# Patient Record
Sex: Female | Born: 1935 | Race: Black or African American | Hispanic: No | Smoking: Never smoker
Health system: Southern US, Community
[De-identification: ages and names within clinical notes are randomized; demographics above are authoritative.]

## PROBLEM LIST (undated history)

## (undated) DIAGNOSIS — R109 Unspecified abdominal pain: Secondary | ICD-10-CM

## (undated) DIAGNOSIS — F0390 Unspecified dementia without behavioral disturbance: Secondary | ICD-10-CM

## (undated) DIAGNOSIS — D126 Benign neoplasm of colon, unspecified: Secondary | ICD-10-CM

## (undated) DIAGNOSIS — E785 Hyperlipidemia, unspecified: Secondary | ICD-10-CM

## (undated) DIAGNOSIS — S065X9A Traumatic subdural hemorrhage with loss of consciousness of unspecified duration, initial encounter: Secondary | ICD-10-CM

## (undated) DIAGNOSIS — K219 Gastro-esophageal reflux disease without esophagitis: Secondary | ICD-10-CM

## (undated) DIAGNOSIS — G8929 Other chronic pain: Secondary | ICD-10-CM

## (undated) DIAGNOSIS — I10 Essential (primary) hypertension: Secondary | ICD-10-CM

## (undated) DIAGNOSIS — K760 Fatty (change of) liver, not elsewhere classified: Secondary | ICD-10-CM

## (undated) DIAGNOSIS — E611 Iron deficiency: Secondary | ICD-10-CM

## (undated) HISTORY — DX: Hyperlipidemia, unspecified: E78.5

## (undated) HISTORY — DX: Fatty (change of) liver, not elsewhere classified: K76.0

## (undated) HISTORY — PX: ANKLE SURGERY: SHX546

## (undated) HISTORY — PX: ABDOMINAL HYSTERECTOMY: SHX81

## (undated) HISTORY — DX: Benign neoplasm of colon, unspecified: D12.6

## (undated) HISTORY — DX: Iron deficiency: E61.1

## (undated) HISTORY — PX: VESICOVAGINAL FISTULA CLOSURE W/ TAH: SUR271

## (undated) HISTORY — DX: Gastro-esophageal reflux disease without esophagitis: K21.9

---

## 2001-03-04 ENCOUNTER — Encounter: Payer: Self-pay | Admitting: Family Medicine

## 2001-03-04 ENCOUNTER — Ambulatory Visit (HOSPITAL_COMMUNITY): Admission: RE | Admit: 2001-03-04 | Discharge: 2001-03-04 | Payer: Self-pay | Admitting: Family Medicine

## 2002-03-06 ENCOUNTER — Encounter: Payer: Self-pay | Admitting: Family Medicine

## 2002-03-06 ENCOUNTER — Ambulatory Visit (HOSPITAL_COMMUNITY): Admission: RE | Admit: 2002-03-06 | Discharge: 2002-03-06 | Payer: Self-pay | Admitting: Family Medicine

## 2003-03-08 ENCOUNTER — Ambulatory Visit (HOSPITAL_COMMUNITY): Admission: RE | Admit: 2003-03-08 | Discharge: 2003-03-08 | Payer: Self-pay | Admitting: Internal Medicine

## 2003-03-08 ENCOUNTER — Encounter: Payer: Self-pay | Admitting: Obstetrics and Gynecology

## 2003-06-07 ENCOUNTER — Ambulatory Visit (HOSPITAL_COMMUNITY): Admission: RE | Admit: 2003-06-07 | Discharge: 2003-06-07 | Payer: Self-pay | Admitting: Family Medicine

## 2003-06-07 ENCOUNTER — Encounter: Payer: Self-pay | Admitting: Family Medicine

## 2004-03-13 ENCOUNTER — Ambulatory Visit (HOSPITAL_COMMUNITY): Admission: RE | Admit: 2004-03-13 | Discharge: 2004-03-13 | Payer: Self-pay | Admitting: Family Medicine

## 2004-04-05 HISTORY — PX: ESOPHAGOGASTRODUODENOSCOPY: SHX1529

## 2004-04-19 ENCOUNTER — Ambulatory Visit (HOSPITAL_COMMUNITY): Admission: RE | Admit: 2004-04-19 | Discharge: 2004-04-19 | Payer: Self-pay | Admitting: Internal Medicine

## 2004-04-28 ENCOUNTER — Ambulatory Visit (HOSPITAL_COMMUNITY): Admission: RE | Admit: 2004-04-28 | Discharge: 2004-04-28 | Payer: Self-pay | Admitting: Internal Medicine

## 2004-06-05 HISTORY — PX: COLONOSCOPY: SHX174

## 2004-06-07 ENCOUNTER — Ambulatory Visit (HOSPITAL_COMMUNITY): Admission: RE | Admit: 2004-06-07 | Discharge: 2004-06-07 | Payer: Self-pay | Admitting: Internal Medicine

## 2004-07-12 ENCOUNTER — Emergency Department (HOSPITAL_COMMUNITY): Admission: EM | Admit: 2004-07-12 | Discharge: 2004-07-12 | Payer: Self-pay | Admitting: Emergency Medicine

## 2005-03-16 ENCOUNTER — Ambulatory Visit (HOSPITAL_COMMUNITY): Admission: RE | Admit: 2005-03-16 | Discharge: 2005-03-16 | Payer: Self-pay | Admitting: Family Medicine

## 2006-03-18 ENCOUNTER — Ambulatory Visit (HOSPITAL_COMMUNITY): Admission: RE | Admit: 2006-03-18 | Discharge: 2006-03-18 | Payer: Self-pay | Admitting: Family Medicine

## 2007-03-21 ENCOUNTER — Ambulatory Visit (HOSPITAL_COMMUNITY): Admission: RE | Admit: 2007-03-21 | Discharge: 2007-03-21 | Payer: Self-pay | Admitting: Family Medicine

## 2007-12-24 ENCOUNTER — Ambulatory Visit (HOSPITAL_COMMUNITY): Admission: RE | Admit: 2007-12-24 | Discharge: 2007-12-24 | Payer: Self-pay | Admitting: Ophthalmology

## 2007-12-29 ENCOUNTER — Emergency Department (HOSPITAL_COMMUNITY): Admission: EM | Admit: 2007-12-29 | Discharge: 2007-12-29 | Payer: Self-pay | Admitting: Emergency Medicine

## 2007-12-31 ENCOUNTER — Emergency Department (HOSPITAL_COMMUNITY): Admission: EM | Admit: 2007-12-31 | Discharge: 2007-12-31 | Payer: Self-pay | Admitting: Emergency Medicine

## 2008-02-10 ENCOUNTER — Emergency Department (HOSPITAL_COMMUNITY): Admission: EM | Admit: 2008-02-10 | Discharge: 2008-02-10 | Payer: Self-pay | Admitting: Emergency Medicine

## 2008-02-12 ENCOUNTER — Emergency Department (HOSPITAL_COMMUNITY): Admission: EM | Admit: 2008-02-12 | Discharge: 2008-02-12 | Payer: Self-pay | Admitting: Emergency Medicine

## 2008-03-01 ENCOUNTER — Encounter (HOSPITAL_COMMUNITY): Admission: RE | Admit: 2008-03-01 | Discharge: 2008-03-31 | Payer: Self-pay | Admitting: Oncology

## 2008-03-01 ENCOUNTER — Ambulatory Visit (HOSPITAL_COMMUNITY): Payer: Self-pay | Admitting: Oncology

## 2008-04-07 ENCOUNTER — Ambulatory Visit (HOSPITAL_COMMUNITY): Admission: RE | Admit: 2008-04-07 | Discharge: 2008-04-07 | Payer: Self-pay | Admitting: Family Medicine

## 2008-05-05 ENCOUNTER — Ambulatory Visit (HOSPITAL_COMMUNITY): Payer: Self-pay | Admitting: Oncology

## 2008-05-05 ENCOUNTER — Encounter (HOSPITAL_COMMUNITY): Admission: RE | Admit: 2008-05-05 | Discharge: 2008-06-04 | Payer: Self-pay | Admitting: Oncology

## 2008-10-05 ENCOUNTER — Encounter (HOSPITAL_COMMUNITY): Admission: RE | Admit: 2008-10-05 | Discharge: 2008-11-03 | Payer: Self-pay | Admitting: Oncology

## 2008-10-05 ENCOUNTER — Ambulatory Visit (HOSPITAL_COMMUNITY): Payer: Self-pay | Admitting: Oncology

## 2009-03-24 ENCOUNTER — Encounter (INDEPENDENT_AMBULATORY_CARE_PROVIDER_SITE_OTHER): Payer: Self-pay | Admitting: *Deleted

## 2009-04-05 ENCOUNTER — Ambulatory Visit (HOSPITAL_COMMUNITY): Admission: RE | Admit: 2009-04-05 | Discharge: 2009-04-05 | Payer: Self-pay | Admitting: Internal Medicine

## 2009-04-11 ENCOUNTER — Ambulatory Visit (HOSPITAL_COMMUNITY): Admission: RE | Admit: 2009-04-11 | Discharge: 2009-04-11 | Payer: Self-pay | Admitting: Family Medicine

## 2009-05-26 DIAGNOSIS — R635 Abnormal weight gain: Secondary | ICD-10-CM

## 2009-05-26 DIAGNOSIS — R1013 Epigastric pain: Secondary | ICD-10-CM

## 2009-05-26 DIAGNOSIS — K219 Gastro-esophageal reflux disease without esophagitis: Secondary | ICD-10-CM | POA: Insufficient documentation

## 2009-05-26 DIAGNOSIS — D126 Benign neoplasm of colon, unspecified: Secondary | ICD-10-CM

## 2009-05-26 DIAGNOSIS — R109 Unspecified abdominal pain: Secondary | ICD-10-CM | POA: Insufficient documentation

## 2009-05-26 DIAGNOSIS — K648 Other hemorrhoids: Secondary | ICD-10-CM | POA: Insufficient documentation

## 2009-06-02 ENCOUNTER — Ambulatory Visit: Payer: Self-pay | Admitting: Internal Medicine

## 2009-06-02 DIAGNOSIS — D509 Iron deficiency anemia, unspecified: Secondary | ICD-10-CM

## 2009-06-02 DIAGNOSIS — Z91011 Allergy to milk products: Secondary | ICD-10-CM

## 2009-06-02 DIAGNOSIS — R198 Other specified symptoms and signs involving the digestive system and abdomen: Secondary | ICD-10-CM

## 2009-06-03 ENCOUNTER — Telehealth (INDEPENDENT_AMBULATORY_CARE_PROVIDER_SITE_OTHER): Payer: Self-pay

## 2009-06-03 ENCOUNTER — Encounter: Payer: Self-pay | Admitting: Internal Medicine

## 2009-06-07 ENCOUNTER — Ambulatory Visit: Payer: Self-pay | Admitting: Internal Medicine

## 2009-06-09 ENCOUNTER — Encounter: Payer: Self-pay | Admitting: Internal Medicine

## 2009-06-10 ENCOUNTER — Encounter: Payer: Self-pay | Admitting: Internal Medicine

## 2009-06-16 ENCOUNTER — Encounter: Payer: Self-pay | Admitting: Internal Medicine

## 2009-06-16 ENCOUNTER — Ambulatory Visit (HOSPITAL_COMMUNITY): Admission: RE | Admit: 2009-06-16 | Discharge: 2009-06-16 | Payer: Self-pay | Admitting: Internal Medicine

## 2009-06-16 ENCOUNTER — Ambulatory Visit: Payer: Self-pay | Admitting: Internal Medicine

## 2009-06-16 HISTORY — PX: COLONOSCOPY: SHX174

## 2009-06-18 ENCOUNTER — Encounter: Payer: Self-pay | Admitting: Internal Medicine

## 2009-07-19 ENCOUNTER — Encounter: Payer: Self-pay | Admitting: Internal Medicine

## 2009-09-27 ENCOUNTER — Encounter: Payer: Self-pay | Admitting: Internal Medicine

## 2009-10-05 HISTORY — PX: COLONOSCOPY: SHX174

## 2009-10-19 ENCOUNTER — Ambulatory Visit (HOSPITAL_COMMUNITY): Admission: RE | Admit: 2009-10-19 | Discharge: 2009-10-19 | Payer: Self-pay | Admitting: Internal Medicine

## 2009-10-19 ENCOUNTER — Ambulatory Visit: Payer: Self-pay | Admitting: Internal Medicine

## 2009-10-27 ENCOUNTER — Telehealth (INDEPENDENT_AMBULATORY_CARE_PROVIDER_SITE_OTHER): Payer: Self-pay

## 2009-11-16 ENCOUNTER — Encounter: Payer: Self-pay | Admitting: Internal Medicine

## 2009-11-17 ENCOUNTER — Telehealth (INDEPENDENT_AMBULATORY_CARE_PROVIDER_SITE_OTHER): Payer: Self-pay

## 2009-12-02 ENCOUNTER — Ambulatory Visit (HOSPITAL_COMMUNITY): Admission: RE | Admit: 2009-12-02 | Discharge: 2009-12-02 | Payer: Self-pay | Admitting: Internal Medicine

## 2009-12-14 ENCOUNTER — Encounter: Payer: Self-pay | Admitting: Internal Medicine

## 2009-12-16 ENCOUNTER — Ambulatory Visit (HOSPITAL_COMMUNITY): Admission: RE | Admit: 2009-12-16 | Discharge: 2009-12-16 | Payer: Self-pay | Admitting: Interventional Radiology

## 2009-12-23 ENCOUNTER — Encounter (INDEPENDENT_AMBULATORY_CARE_PROVIDER_SITE_OTHER): Payer: Self-pay

## 2009-12-28 ENCOUNTER — Encounter: Payer: Self-pay | Admitting: Internal Medicine

## 2010-01-03 HISTORY — PX: COLONOSCOPY: SHX174

## 2010-01-04 ENCOUNTER — Encounter: Payer: Self-pay | Admitting: Internal Medicine

## 2010-01-09 ENCOUNTER — Ambulatory Visit (HOSPITAL_COMMUNITY): Admission: RE | Admit: 2010-01-09 | Discharge: 2010-01-09 | Payer: Self-pay | Admitting: Internal Medicine

## 2010-01-09 ENCOUNTER — Ambulatory Visit: Payer: Self-pay | Admitting: Internal Medicine

## 2010-03-24 ENCOUNTER — Encounter: Payer: Self-pay | Admitting: Internal Medicine

## 2010-04-27 ENCOUNTER — Ambulatory Visit (HOSPITAL_COMMUNITY): Admission: RE | Admit: 2010-04-27 | Discharge: 2010-04-27 | Payer: Self-pay | Admitting: Internal Medicine

## 2010-06-26 ENCOUNTER — Emergency Department (HOSPITAL_COMMUNITY): Admission: EM | Admit: 2010-06-26 | Discharge: 2010-06-26 | Payer: Self-pay | Admitting: Emergency Medicine

## 2010-11-23 ENCOUNTER — Other Ambulatory Visit (HOSPITAL_COMMUNITY): Payer: Self-pay | Admitting: Interventional Radiology

## 2010-11-27 ENCOUNTER — Encounter: Payer: Self-pay | Admitting: Family Medicine

## 2010-12-05 NOTE — Progress Notes (Signed)
----   Converted from flag ---- ---- 11/17/2009 11:22 AM, Jonathon Bellows MD wrote: 6 weeks from now  ---- 11/17/2009 11:19 AM, Cloria Spring LPN wrote: Dr. Jena Gauss, Do you want her repeat TCS  6 weeks from now, or 6 weeks from her procedure on 10/19/2009? ------------------------------

## 2010-12-05 NOTE — Letter (Signed)
Summary: [facility]ICE [facility]  OFFICE [facility]   Imported By: Ave Filter 03/24/2010 10:09:35  _____________________________________________________________________  External Attachment:    Type:   Image     Comment:   External Document

## 2010-12-05 NOTE — Letter (Signed)
Summary: Internal Other triage/instructions  Internal Other triage/instructions   Imported By: Cloria Spring LPN 30/86/5784 69:62:95  _____________________________________________________________________  External Attachment:    Type:   Image     Comment:   External Document

## 2010-12-05 NOTE — Letter (Signed)
Summary: Internal Other Domingo Dimes  Internal Other Domingo Dimes   Imported By: Cloria Spring LPN 09/81/1914 78:29:56  _____________________________________________________________________  External Attachment:    Type:   Image     Comment:   External Document  Appended Document: Internal Other Domingo Dimes triage ok for 3/1  Appended Document: Internal Other Domingo Dimes also, plese hold glucaphage dose night before and morning of procedure  Appended Document: Internal Other /triage Pt is taking iron, she has already taken it today. If she holds now til after procedure on March 1st, will that be sufficient, or will she need to reschedule?  Appended Document: Internal Other /triage Pt was resceduled to 01/09/2010 @ 10:15. Selena Batten is aware.  Appended Document: Internal Other /triage Probably would have been ok; burt reschedule noted

## 2010-12-05 NOTE — Letter (Signed)
Summary: Recall Colonoscopy/Endoscopy, Change to Office Visit  [facility]  [address]   [place], [place] [address]   Phone: [phone]  Fax: [phone]      December 23, 2009   Taylor Andrews [address] [place], [place]  [address] Mar 09, 1934   Dear [name],   According to our records, it is time for you to schedule a Colonoscopy.  Please call our office @ [phone] and ask to speak to the nurse about scheduling this procedure. Please do not neglect your health.   Sincerely,   Cloria Spring LPN  [facility] Ph: [phone]   Fax: [phone]

## 2010-12-05 NOTE — Letter (Signed)
Summary: Patient Notice, Colon Biopsy Results  [facility]  [address]   [place], [place] [address]   Phone: [phone]  Fax: [phone]       November 16, 2009   ARNETRA TERRIS [address] [place], [place]  [address] 31-Dec-1933    Dear [name],  As discussed on the phone with you this evening, I am pleased to inform you that the polyp removed during your recent colonoscopy did not show any evidence of cancer upon pathologic examination. However, because it was rather large and the pathologist said there were some pre-cancerous cells in the polyp, it would be safest to recheck this area of your colon in the near future.  This is best done with another colonoscopy.  If there is any residual polyp in this area that could not be removed at that time, then an operation may be required to remove the involved part of your colon.    You should have a repeat colonoscopy examination  in 6 weeks.  My office staff will schedule this procedure.   Please call us if you are having persistent problems or have questions about your condition that have not been fully answered at this time.  Sincerely,    R. Roetta Sessions MD  [facility] Ph: [phone]    Fax: [phone]   Appended Document: Patient Notice, Colon Biopsy Results Noted in IDX.  Appended Document: Patient Notice, Colon Biopsy Results mailed pt letter

## 2010-12-11 ENCOUNTER — Other Ambulatory Visit (HOSPITAL_COMMUNITY): Payer: Self-pay | Admitting: Interventional Radiology

## 2010-12-11 ENCOUNTER — Ambulatory Visit (HOSPITAL_COMMUNITY)
Admission: RE | Admit: 2010-12-11 | Discharge: 2010-12-11 | Disposition: A | Discharge status: Home / Self Care | Payer: Medicare Other | Source: Ambulatory Visit | Attending: Interventional Radiology | Admitting: Interventional Radiology

## 2010-12-11 ENCOUNTER — Inpatient Hospital Stay (HOSPITAL_COMMUNITY): Admission: RE | Admit: 2010-12-11 | Payer: Self-pay | Source: Ambulatory Visit

## 2010-12-11 DIAGNOSIS — I771 Stricture of artery: Secondary | ICD-10-CM

## 2010-12-11 DIAGNOSIS — G319 Degenerative disease of nervous system, unspecified: Secondary | ICD-10-CM | POA: Insufficient documentation

## 2010-12-11 DIAGNOSIS — R69 Illness, unspecified: Secondary | ICD-10-CM | POA: Insufficient documentation

## 2010-12-11 LAB — CREATININE, SERUM
Creatinine, Ser: 0.81 mg/dL (ref 0.4–1.2)
GFR calc Af Amer: >60 mL/min (ref >60)

## 2010-12-11 MED ORDER — GADOBENATE DIMEGLUMINE 529 MG/ML IV SOLN
15.0000 mL | Freq: Once | INTRAVENOUS | Status: AC
  Administered 2010-12-11: 15 mL via INTRAVENOUS

## 2010-12-11 MED ORDER — GADOBENATE DIMEGLUMINE 529 MG/ML IV SOLN: 15.0000 mL | Freq: Once | INTRAVENOUS | Status: DC

## 2010-12-28 ENCOUNTER — Encounter (INDEPENDENT_AMBULATORY_CARE_PROVIDER_SITE_OTHER): Payer: Self-pay | Admitting: *Deleted

## 2011-01-01 ENCOUNTER — Encounter: Payer: Self-pay | Admitting: Internal Medicine

## 2011-01-02 NOTE — Letter (Signed)
Summary: Recall, Screening Colonoscopy Only  [facility]  [address]   [place], [place] [address]   Phone: [phone]  Fax: [phone]    December 28, 2010  Taylor Andrews [address] [place], [place]  [address] [birth year]   Dear [name],   Our records indicate it is time to schedule your colonoscopy.  Please call our office at [phone] and ask for the nurse.   Thank you, Hendricks Limes, LPN Cloria Spring, LPN  [facility] Ph: [phone]   Fax: [phone]

## 2011-01-11 NOTE — Letter (Signed)
Summary: Internal Other  Internal Other   Imported By: Peggyann Shoals 01/01/2011 12:46:31  _____________________________________________________________________  External Attachment:    Type:   Image     Comment:   External Document

## 2011-01-12 ENCOUNTER — Encounter: Payer: Medicare Other | Admitting: Internal Medicine

## 2011-01-12 ENCOUNTER — Other Ambulatory Visit: Payer: Self-pay | Admitting: Internal Medicine

## 2011-01-12 ENCOUNTER — Ambulatory Visit (HOSPITAL_COMMUNITY)
Admission: RE | Admit: 2011-01-12 | Discharge: 2011-01-12 | Disposition: A | Discharge status: Home / Self Care | Payer: Medicare Other | Source: Ambulatory Visit | Attending: Internal Medicine | Admitting: Internal Medicine

## 2011-01-12 DIAGNOSIS — Z7982 Long term (current) use of aspirin: Secondary | ICD-10-CM | POA: Insufficient documentation

## 2011-01-12 DIAGNOSIS — Z85038 Personal history of other malignant neoplasm of large intestine: Secondary | ICD-10-CM | POA: Insufficient documentation

## 2011-01-12 DIAGNOSIS — E119 Type 2 diabetes mellitus without complications: Secondary | ICD-10-CM | POA: Insufficient documentation

## 2011-01-12 DIAGNOSIS — D126 Benign neoplasm of colon, unspecified: Secondary | ICD-10-CM | POA: Insufficient documentation

## 2011-01-12 DIAGNOSIS — Z860100 Personal history of colon polyps, unspecified: Secondary | ICD-10-CM | POA: Insufficient documentation

## 2011-01-12 DIAGNOSIS — K5730 Diverticulosis of large intestine without perforation or abscess without bleeding: Secondary | ICD-10-CM

## 2011-01-12 DIAGNOSIS — E785 Hyperlipidemia, unspecified: Secondary | ICD-10-CM | POA: Insufficient documentation

## 2011-01-12 DIAGNOSIS — Z79899 Other long term (current) drug therapy: Secondary | ICD-10-CM | POA: Insufficient documentation

## 2011-01-12 DIAGNOSIS — I10 Essential (primary) hypertension: Secondary | ICD-10-CM | POA: Insufficient documentation

## 2011-01-12 DIAGNOSIS — Z09 Encounter for follow-up examination after completed treatment for conditions other than malignant neoplasm: Secondary | ICD-10-CM

## 2011-01-12 HISTORY — PX: COLONOSCOPY: SHX174

## 2011-01-12 LAB — GLUCOSE, CAPILLARY: Glucose-Capillary: 91 mg/dL (ref 70–99)

## 2011-01-15 NOTE — Op Note (Signed)
Taylor Andrews, HUND                  ACCOUNT NO.:  [id number]  MEDICAL RECORD NO.:  [id number]           PATIENT TYPE:  O  LOCATION:  DAYP                          FACILITY:  [facility]  PHYSICIAN:  R. Roetta Sessions, M.D. DATE OF BIRTH:  06/05/1934  DATE OF PROCEDURE:  01/12/2011 DATE OF DISCHARGE:                              OPERATIVE REPORT   PROCEDURE:  Colonoscopy with snare polypectomy.  INDICATIONS FOR PROCEDURE:  The patient is a 77 year old lady with multiple colonic polyps removed in the past couple of years, hepatic flexure, polyp was piecemeal removed.  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  Previously, her last colonoscopy was done about 1 year ago.  There was small minimally remaining polypoid tissue at the hepatic flexure site of previous piecemeal polypectomy.  Pathology revealed a tubular adenoma only without any high-grade dysplasia.  She is clinically done well in the past 1 year.  Colonoscopy is now being done, somewhat early as a surveillance maneuver given relatively high- grade pathology seen recently.  Risks, benefits, limitations, alternatives of this approach have been reviewed, questions answered. Please see the documentation in the medical record.  PROCEDURE NOTE:  O2 saturation, blood pressure, pulse, respirations were monitored throughout the entire procedure.  CONSCIOUS SEDATION:  Versed 4 mg IV, Demerol 75 mg IV in divided doses.  INSTRUMENT:  Pentax video chip system.  FINDINGS:  Digital rectal exam revealed no abnormalities.  Endoscopic findings:  Prep was good.  Colon:  Colonic mucosa was surveyed from the rectosigmoid junction through the left transverse right colon to the appendiceal orifice and ileocecal valve/cecum.  These structures were well seen and photographed for the record.  From this level, the scope was slowly withdrawn.  All previously mentioned mucosal surfaces were again seen.  The patient had scattered left-sided  diverticula.  There is a 5-mm polyp on a stalk at the hepatic flexure on the way in which I cold snared, recovered through the scope.  The site of prior hepatic flexure polypectomy was readily identified, tattooing of the mucosa persisted.  There was a scar present, but there was absolutely no evidence of any residual adenomatous tissue neoplasm.  Please see multiple photos.  Aside from the small polyp taken off the splenic flexure and diverticula, the colonic mucosa appeared otherwise normal. Scope was pulled down into the rectum where thorough examination of rectal mucosa including retroflexed view of the anal verge demonstrated no abnormalities.  The patient tolerated the procedure well.  Cecal withdrawal time 8 minutes.  IMPRESSION: 1. Normal rectum. 2. A 5-mm polyp at the splenic flexure status post cold snare     polypectomy, tattooing, and scarred hepatic flexure, absolutely no     evidence of any residual polyp tissue or neoplasm. 3. Scattered left-sided diverticula.  Remainder of colonic mucosa     appeared normal.  RECOMMENDATIONS: 1. Polyp and diverticulosis literature provided to [name]. 2. Follow up on path. 3. Further recommendations to follow.     Jonathon Bellows, M.D.     RMR/MEDQ  D:  01/12/2011  T:  01/12/2011  Job:  [id number]  cc:   [facility]  Electronically Signed by Lorrin Goodell M.D. on 01/15/2011 10:30:14 AM

## 2011-01-16 ENCOUNTER — Encounter: Payer: Self-pay | Admitting: Internal Medicine

## 2011-01-18 LAB — BASIC METABOLIC PANEL
BUN: 10 mg/dL (ref 6–23)
Calcium: 9.8 mg/dL (ref 8.4–10.5)
GFR calc non Af Amer: >60 mL/min (ref >60)
Potassium: 3.5 mEq/L (ref 3.5–5.1)
Sodium: 138 mEq/L (ref 135–145)

## 2011-01-18 LAB — CBC
HCT: 43.3 % (ref 36.0–46.0)
RDW: 14.1 % (ref 11.5–15.5)
WBC: 6.2 10*3/uL (ref 4.0–10.5)

## 2011-01-18 LAB — DIFFERENTIAL
Basophils Absolute: 0 10*3/uL (ref 0.0–0.1)
Lymphocytes Relative: 34 % (ref 12–46)
Monocytes Relative: 8 % (ref 3–12)
Neutro Abs: 3.5 10*3/uL (ref 1.7–7.7)
Neutrophils Relative %: 56 % (ref 43–77)

## 2011-01-23 NOTE — Letter (Addendum)
Summary: Patient Notice, Colon Biopsy Results  [facility]  [address]   [place], [place] [address]   Phone: [phone]  Fax: [phone]       January 16, 2011   Taylor Andrews [address] [place], [place]  [address] [birth year]    Dear Taylor Andrews,  I am pleased to inform you that the biopsies taken during your recent colonoscopy did not show any evidence of cancer upon pathologic examination.  Additional information/recommendations:  No further action is needed at this time.  Please follow-up with your primary care physician for your other healthcare needs.  You should have a repeat colonoscopy examination  in 5 years.  Please call us if you are having persistent problems or have questions about your condition that have not been fully answered at this time.  Sincerely,    R. Roetta Sessions MD, FACP [facility] Ph: [phone]    Fax: [phone]   Appended Document: Patient Notice, Colon Biopsy Results mailed letter to pt  Appended Document: Patient Notice, Colon Biopsy Results reminder in epic

## 2011-01-25 LAB — GLUCOSE, CAPILLARY
Glucose-Capillary: 110 mg/dL — ABNORMAL HIGH (ref 70–99)
Glucose-Capillary: 88 mg/dL (ref 70–99)

## 2011-01-25 LAB — BASIC METABOLIC PANEL
BUN: 8 mg/dL (ref 6–23)
Chloride: 104 mEq/L (ref 96–112)
GFR calc Af Amer: >60 mL/min (ref >60)
GFR calc non Af Amer: >60 mL/min (ref >60)
Potassium: 3.8 mEq/L (ref 3.5–5.1)
Sodium: 138 mEq/L (ref 135–145)

## 2011-01-25 LAB — CBC
HCT: 42.6 % (ref 36.0–46.0)
Hemoglobin: 13.9 g/dL (ref 12.0–15.0)
MCV: 89.2 fL (ref 78.0–100.0)
RBC: 4.78 MIL/uL (ref 3.87–5.11)
WBC: 7.3 10*3/uL (ref 4.0–10.5)

## 2011-01-25 LAB — APTT: aPTT: 28 seconds (ref 24–37)

## 2011-01-29 ENCOUNTER — Other Ambulatory Visit (HOSPITAL_COMMUNITY): Payer: Self-pay | Admitting: Internal Medicine

## 2011-01-29 DIAGNOSIS — R142 Eructation: Secondary | ICD-10-CM

## 2011-01-30 ENCOUNTER — Encounter (HOSPITAL_COMMUNITY): Payer: Self-pay

## 2011-01-30 ENCOUNTER — Ambulatory Visit (HOSPITAL_COMMUNITY)
Admission: RE | Admit: 2011-01-30 | Discharge: 2011-01-30 | Disposition: A | Discharge status: Home / Self Care | Payer: Medicare Other | Source: Ambulatory Visit | Attending: Internal Medicine | Admitting: Internal Medicine

## 2011-01-30 DIAGNOSIS — R143 Flatulence: Secondary | ICD-10-CM | POA: Insufficient documentation

## 2011-01-30 DIAGNOSIS — K219 Gastro-esophageal reflux disease without esophagitis: Secondary | ICD-10-CM | POA: Insufficient documentation

## 2011-01-30 DIAGNOSIS — R141 Gas pain: Secondary | ICD-10-CM | POA: Insufficient documentation

## 2011-01-30 DIAGNOSIS — R142 Eructation: Secondary | ICD-10-CM | POA: Insufficient documentation

## 2011-01-30 HISTORY — DX: Essential (primary) hypertension: I10

## 2011-02-10 LAB — GLUCOSE, CAPILLARY: Glucose-Capillary: 105 mg/dL — ABNORMAL HIGH (ref 70–99)

## 2011-03-20 NOTE — Op Note (Signed)
NAMEBASILIA, Taylor Andrews                  ACCOUNT NO.:  [id number]   MEDICAL RECORD NO.:  [id number]          PATIENT TYPE:  AMB   LOCATION:  DAY                           FACILITY:  [facility]   PHYSICIAN:  R. Roetta Sessions, M.D. DATE OF BIRTH:  Sep 08, 1934   DATE OF PROCEDURE:  06/16/2009  DATE OF DISCHARGE:                               OPERATIVE REPORT   Colonoscopy with multiple snare polypectomies.   INDICATIONS FOR PROCEDURE:  A 77 year old lady found be Hemoccult  positive recently.  Has also had chronic iron-deficiency notable for  years.  She had a colonoscopy here in 2005,  had a diminutive left colon  polyp removed and internal hemorrhoids.  She has done well clinically  although she had some diarrhea when her iron supplementation was  increased recently.  We saw on the office and obtain a immuno fecal  Hemoccult which was positive.  She comes now for colonoscopy.  This  approach has been discussed with the patient at length.  Risks,  benefits, alternatives and limitations reviewed.  Please see  documentation in the medical record.   PROCEDURE NOTE:  O2 saturation, blood pressure, pulse, respirations were  monitored the entire procedure.   CONSCIOUS SEDATION:  Versed 8 mg IV, Demerol 75 mg IV in divided doses.   INSTRUMENT:  Pentax video chip system.   FINDINGS:  Digital rectal exam revealed no abnormalities.   ENDOSCOPIC FINDINGS:  The prep was good.   COLON:  Colonic mucosa was surveyed from the rectosigmoid junction  through the left, transverse, right colon to the appendiceal orifice,  ileocecal valve and cecum.  These structures were well seen and  photographed for the record.  The patient was noted to have a 8-mm flat  polyp in the area of the proximal transverse colon which was hot snare  removed.  At the hepatic flexure, there was a sprawling, flat,  adenomatous-appearing polyp approximately 3-4 cm in dimensions.  Please  see photos.  This appeared to be an adenomatous  polyp.  Also in the  descending segment, approximately 10 cm back in the ileocecal valve,  there was a 1-cm flat polyp present.  The polyp in the proximal  transverse colon was removed with hot snare cautery.  The polyp in the  ascending segment was removed with hot snare cautery.  The flat polypoid  lesion at the hepatic flexure was lifted off the colonic wall with  normal saline injected submucosally.  This was done nicely and  subsequent hot snare piecemeal polypectomy ensued.  Multiple fragments  were collected for the pathologist.  This lesion was debulked and not  completely removed in this fashion.  There was some oozing in a couple  areas of the polypectomy base which were touched up with the tip of the  snare unit and 7 cc of 1:10,000 epinephrine was injected in the  periphery of the polypectomy crater with good hemostasis noted.  Also, I  elected to use the Endospot and this area was tattooed.  From this level  the scope was slowly and cautiously withdrawn.  All previously  mentioned  mucosal surfaces were again seen.  No other abnormalities were seen.  Scope was pulled down the rectum.  Examination of the rectal mucosa  including retroflex view at the anal verge demonstrated no  abnormalities. It is notable that this procedure took quite a long time  as polyp fragments had to be delivered out via the [place] net.  Collectively cecal withdrawal time 55 minutes.  The patient tolerated  the procedure very well and was reactive in endoscopy.   IMPRESSION:  1. Internal hemorrhoids, otherwise normal rectum.  2. Flat polypoid lesion at the hepatic flexure, debulked with      piecemeal snare polypectomy.  This area was also tattooed.  3. Transverse colon and descending colon  flat adenomatous appearing      polyps resected with hot snare cautery as well.   RECOMMENDATIONS:  1. No aspirin or arthritis medications for the next 2 weeks.  2. Follow-up on path.  3. Assuming the lesion in  the hepatic flexure does not contain frank      carcinoma, plan to bring this lady back in 3 months and reevaluate      the lesion at hepatic flexure and remove whenever residual polyp      tissue may be present.  Further recommendations to follow.      Taylor Andrews, M.D.  Electronically Signed     RMR/MEDQ  D:  06/16/2009  T:  06/16/2009  Job:  [id number]   cc:   Taylor Andrews, M.D.  Fax: [phone]

## 2011-03-23 NOTE — Op Note (Signed)
NAME:  Taylor Andrews, Taylor Andrews                            ACCOUNT NO.:  [id number]   MEDICAL RECORD NO.:  [id number]                   PATIENT TYPE:  AMB   LOCATION:  DAY                                  FACILITY:  [facility]   PHYSICIAN:  R. Roetta Sessions, M.D.              DATE OF BIRTH:  Apr 03, 1934   DATE OF PROCEDURE:  DATE OF DISCHARGE:                                 OPERATIVE REPORT   PROCEDURE:  Diagnostic esophagogastroduodenoscopy.   ENDOSCOPIST:  Gerrit Friends. Rourk, M.D.   INDICATIONS FOR PROCEDURE:  The patient is a 77 year old lady with frequent  symptoms and some intermittent nonspecific abdominal pain.  She has been  taking Voltaren. She has gained almost 50 pounds since 1990.  EGD is now  being done. This approach has been discussed with the patient at length.  The potential risks, benefits, and alternatives have been reviewed;  questions answered.  She is agreeable.  Please see my dictated H&P.   PROCEDURE NOTE:  O2 saturation, blood pressure, pulse and respirations were  monitored throughout the entire procedure.  Conscious sedation: Versed 2 mg  IV, Demerol 50 mg IV in divided doses.   INSTRUMENT:  Olympus video chip system.   FINDINGS:  Examination of the tubular esophagus revealed no mucosal  abnormality.  The EG junction was easily traversed.   STOMACH:  The gastric cavity insufflated well with air.  A thorough  examination of the gastric mucosa including a retroflex view of the proximal  stomach and esophagogastric junction demonstrated no abnormalities aside  from some deformity of the pylorus and a single tiny gastric erosion in the  antrum.  The pylorus was patent and easily traversed.  Examination of the  bulb and second portion revealed no abnormalities.   THERAPEUTIC/DIAGNOSTIC MANEUVERS:  None.   The patient tolerated the procedure well and was reacted in endoscopy.   IMPRESSION:  1. Normal esophagus.  2. A single tiny antral erosion with some deformity of the  pyloric channel     otherwise.  3. Normal gastric mucosa.  4. Normal D1-D2.  5. Some of her symptoms could be fall into the realm of gastroesophageal     reflux disease.  In addition, some of her lower abdominal discomfort is     simply related to bowel function which may bring a component of irritable     bowel syndrome into the picture.   RECOMMENDATIONS:  1. LFTs, CBC and amylase today.  Will go ahead and check a gallbladder     ultrasound as this has not been done previously and will plan to see this     nice lady back in the office in 6-8 weeks.  She has never had a     colonoscopy; and, at some point, later in the year she ought to have at     least a screening colonoscopy.  2. Further recommendations to follow.  ___________________________________________                                            Jonathon Bellows, M.D.   RMR/MEDQ  D:  04/19/2004  T:  04/19/2004  Job:  [id number]   cc:   Kirk Ruths, M.D.  [address]  [place]  [place] [address]  Fax: [phone]   R. Roetta Sessions, M.D.  [address]  [place]  [place] [address]  Fax: [phone]

## 2011-03-23 NOTE — Op Note (Signed)
NAME:  Taylor Andrews, Taylor Andrews                            ACCOUNT NO.:  [id number]   MEDICAL RECORD NO.:  [id number]                   PATIENT TYPE:  AMB   LOCATION:  DAY                                  FAC[facility]:  [facility]   PHYSICIAN:  R. Roetta Sessions, M.D.              DATE OF BIRTH:  Jun 25, 1934   DATE OF PROCEDURE:  06/07/2004  DATE OF DISCHARGE:                                 OPERATIVE REPORT   PROCEDURE:  Colonoscopy with biopsy.   INDICATIONS FOR PROCEDURE:  The patient is a 77 year old lady who comes for  colorectal cancer screening.  She is devoid of any lower GI tract symptoms.  There is no family history of colorectal neoplasia.  She has never had her  colon imaged previously.  Colonoscopy is now being done.  This approach has  been discussed with the patient at length.  The potential risks, benefits,  and alternatives have been reviewed and questions answered.  She is  agreeable.  Please see my handwritten H&P for more information.   PROCEDURE:  O2 saturation, blood pressure, pulses, and respirations were  monitored throughout the entirety of the procedure.  Conscious sedation was  with Versed 3 mg IV, Demerol 75 mg IV in divided doses.  The instrument used  was the Olympus video chip system.   FINDINGS:  Digital rectal examination revealed no abnormalities.   ENDOSCOPIC FINDINGS:  The prep was excellent.   Rectum:  Examination of the rectal mucosa including retroflex view of the  anal verge revealed only internal hemorrhoids.   Colon:  The colonic mucosa was surveyed from the rectosigmoid junction  through the left, transverse, right colon to the area of the appendiceal  orifice, ileocecal valve, and cecum.  These structures were well-seen and  photographed for the record.  From this level, the scope was slowly  withdrawn.  All previously mentioned mucosal surfaces were again seen.  The  colonic mucosa was well-seen.  The only abnormality was a 3-mm polyp at 30  cm which was  cold biopsy/removed.  The patient tolerated the procedure well  and was reactive in endoscopy.   IMPRESSION:  1. Internal hemorrhoids.  Otherwise normal rectum.  2. Diminutive polyp at 30 cm, cold biopsy/removed.  The remainder of the     colonic mucosa appeared normal.   RECOMMENDATIONS:  1. Follow up on pathology.  2. Further recommendations to follow.      ___________________________________________                                            Jonathon Bellows, M.D.   RMR/MEDQ  D:  06/07/2004  T:  06/07/2004  Job:  [id number]   cc:   Kirk Ruths, M.D.  [address]  [place]  [place] [address]  Fax: [phone]

## 2011-03-23 NOTE — Consult Note (Signed)
NAME:  Taylor Andrews, Taylor Andrews                            ACCOUNT NO.:  [id number]   MEDICAL RECORD NO.:  [id number]                  PATIENT TYPE:   LOCATION:                                       FACILITY:  [facility]   PHYSICIAN:  R. Roetta Sessions, M.D.              DATE OF BIRTH:  11/22/1933   DATE OF CONSULTATION:  04/12/2004  DATE OF DISCHARGE:                                   CONSULTATION   GASTROENTEROLOGY CONSULTATION:   PRIMARY CARE PHYSICIAN:  Kirk Ruths, M.D.   REASON FOR CONSULTATION:  Epigastric pain, refractory reflux symptoms.   HISTORY OF PRESENT ILLNESS:  Taylor Andrews is a 77 year old obese African-  American female kindly sent over courtesy of Dr. Yetta Numbers to further  evaluate 18-month history of epigastric pain which sometimes bothers her in  the evening and in the early morning hours, not affected by eating,  sometimes lessened with having a bowel movement but sometimes exacerbated  after having a bowel movement, has not had any melena or rectal bleeding,  she does have indigestion all the time.  It is notable she was started on  Voltaren 6 months ago for arthritic complaints, also started on Protonix 40  mg orally daily 6 months ago which she says initially helped her reflux  symptoms but it is now like she is not taking the medication at all.  No  odynophagia, no dysphagia.  She tells me she has gained 47 pounds gradually  since 1990 when she relocated to this area from [place].  She describes  having an upper GI series some time several months ago without any  significant findings.  I do not have a copy of that report for review.  She  has also been given some Levsin sublingual for the above-mentioned symptoms  which made her dizzy but has not really helped in respects to her  gallbladder in situ.   PAST MEDICAL HISTORY:  1. Hypertension.  2. Type 2 diabetes mellitus.  3. Osteoarthritis.   PAST SURGICAL HISTORY:  1. Hysterectomy.  2. Left ankle  pins.   She has never had a colonoscopy.   CURRENT MEDICATIONS:  1. Norvasc 10 mg daily.  2. Glucophage 500 mg b.i.d.  3. Hydrochlorothiazide 25 mg half tablet daily.  4. Protonix 40 mg daily.  5. Voltaren 75 mg b.i.d.  6. Levsin sublingual 0.125 mg p.r.n.  7. Aspirin 81 mg daily.  8. Vitamin C 500 mg daily.  9. Cod liver oil daily.  10.      Glucosamine 500 mg daily.  11.      Multivitamin daily.  12.      Calcium 600 daily.   ALLERGIES:  No known drug allergies (does have some GI upset with milk, ice  cream, bananas).   FAMILY HISTORY:  Mother died of old age at age 83.  Father had a heart  murmur  and died at age 79.  No history of chronic GI or liver disease  otherwise.   SOCIAL HISTORY:  Patient is single, has three grown sons, she is retired.  No tobacco, no alcohol.   REVIEW OF SYSTEMS:  As in history of present illness no chest pain, dyspnea  on exertion, weight gain as eluted to above, no fever or chills.   PHYSICAL EXAMINATION:  GENERAL:  Pleasant 77 year old obese female resting  comfortably.  VITAL SIGNS:  Weight 187.25, height 4 feet 9-1/2 inches, temperature 97.4,  BP 130/80, pulse 70.  SKIN:  Warm and dry.  HEENT:  No scleral icterus.  Oral cavity no lesions.  JVD is not prominent.  CHEST:  Lungs are clear to auscultation.  HEART:  Regular rate and rhythm, without murmur, gallop, or rub.  BREAST:  Exam is deferred.  ABDOMEN:  Obese, positive bowel sounds, soft, nontender, without appreciable  mass or organomegaly.  EXTREMITIES:  No edema.   ADMITTING IMPRESSION:  Taylor Andrews is a 77 year old lady with a 9-month  history of somewhat vague epigastric pain lasting no more than several  minutes at a time, sometimes worsened and sometimes ameliorated with having  a bowel movement, she describes reflux symptoms that are now refractory to  proton pump inhibitor therapy.   It is interesting to note this lady's symptoms started when she began  Voltaren some  6 months ago.  I am certainly concerned about the possibility  of peptic ulcer disease and unbridled gastroesophageal reflux disease in the  setting of significant obesity and steady weight gain.  At this point  gallbladder disease and an element of inflammatory bowel syndrome is not  excluded.  She has never had a colonoscopy.   RECOMMENDATIONS:  We will proceed with an EGD in the very near future to  further evaluate her symptoms.  This approach has been discussed with Ms.  Taylor Andrews at length.  Potential risks, benefits, and alternatives have been  reviewed.  I will make further recommendations in regards to her symptoms  after endoscopic evaluation has been  completed.  As a separated issue I have recommended that Taylor Andrews should  come back later in the year for screening colonoscopy.   I would like to thank Dr. Yetta Numbers for allowing me to see this nice lady  today.      ___________________________________________                                            Jonathon Bellows, M.D.   RMR/MEDQ  D:  04/12/2004  T:  04/12/2004  Job:  [id number]   cc:   Kirk Ruths, M.D.  [address]  [place]  [place] [address]  Fax: [phone]

## 2011-03-28 ENCOUNTER — Other Ambulatory Visit (HOSPITAL_COMMUNITY): Payer: Self-pay | Admitting: Internal Medicine

## 2011-03-28 DIAGNOSIS — Z139 Encounter for screening, unspecified: Secondary | ICD-10-CM

## 2011-04-30 ENCOUNTER — Ambulatory Visit (HOSPITAL_COMMUNITY): Admission: RE | Admit: 2011-04-30 | Payer: Medicare Other | Source: Ambulatory Visit

## 2011-06-01 ENCOUNTER — Ambulatory Visit (HOSPITAL_COMMUNITY)
Admission: RE | Admit: 2011-06-01 | Discharge: 2011-06-01 | Disposition: A | Discharge status: Home / Self Care | Payer: Medicare Other | Source: Ambulatory Visit | Attending: Internal Medicine | Admitting: Internal Medicine

## 2011-06-01 DIAGNOSIS — Z1231 Encounter for screening mammogram for malignant neoplasm of breast: Secondary | ICD-10-CM | POA: Insufficient documentation

## 2011-06-01 DIAGNOSIS — Z139 Encounter for screening, unspecified: Secondary | ICD-10-CM

## 2011-07-27 LAB — DIFFERENTIAL
Basophils Absolute: 0
Basophils Relative: 0
Eosinophils Absolute: 0.1
Lymphocytes Relative: 6 — ABNORMAL LOW
Lymphs Abs: 0.9
Lymphs Abs: 1.1
Monocytes Absolute: 0.8
Monocytes Relative: 6
Neutro Abs: 12 — ABNORMAL HIGH
Neutro Abs: 13.8 — ABNORMAL HIGH

## 2011-07-27 LAB — CBC
Hemoglobin: 11.5 — ABNORMAL LOW
Hemoglobin: 12.9
MCHC: 33.1
MCV: 84.2
Platelets: 377
RBC: 4.13
RDW: 14.3
WBC: 15.5 — ABNORMAL HIGH

## 2011-07-27 LAB — BASIC METABOLIC PANEL
BUN: 20
Calcium: 9.2
GFR calc non Af Amer: 56 — ABNORMAL LOW
Glucose, Bld: 177 — ABNORMAL HIGH
Sodium: 133 — ABNORMAL LOW

## 2011-07-27 LAB — SEDIMENTATION RATE: Sed Rate: 33 — ABNORMAL HIGH

## 2011-07-31 LAB — IRON AND TIBC
Saturation Ratios: 5 — ABNORMAL LOW
UIBC: 275

## 2011-07-31 LAB — DIFFERENTIAL
Basophils Absolute: 0
Basophils Relative: 0
Eosinophils Absolute: 0
Eosinophils Relative: 0
Eosinophils Relative: 1
Lymphocytes Relative: 7 — ABNORMAL LOW
Lymphocytes Relative: 33
Monocytes Absolute: 0.5
Monocytes Absolute: 0.7
Monocytes Relative: 7
Neutro Abs: 6

## 2011-07-31 LAB — BASIC METABOLIC PANEL
BUN: 8
CO2: 28
GFR calc non Af Amer: >60
Glucose, Bld: 142 — ABNORMAL HIGH
Potassium: 3 — ABNORMAL LOW

## 2011-07-31 LAB — CBC
HCT: 35.2 — ABNORMAL LOW
HCT: 37.9
MCHC: 33.6
Platelets: 434 — ABNORMAL HIGH
Platelets: 556 — ABNORMAL HIGH
RDW: 17.5 — ABNORMAL HIGH
RDW: 16.7 — ABNORMAL HIGH
WBC: 10.2

## 2011-07-31 LAB — URINALYSIS, ROUTINE W REFLEX MICROSCOPIC
Bilirubin Urine: NEGATIVE
Glucose, UA: NEGATIVE
Hgb urine dipstick: NEGATIVE
Specific Gravity, Urine: 1.015
pH: 6

## 2011-07-31 LAB — CK TOTAL AND CKMB (NOT AT ARMC): Relative Index: INVALID

## 2011-07-31 LAB — COMPREHENSIVE METABOLIC PANEL
AST: 18
Albumin: 3.7
Alkaline Phosphatase: 76
BUN: 5 — ABNORMAL LOW
Chloride: 99
Creatinine, Ser: 0.61
GFR calc Af Amer: >60
Potassium: 3.2 — ABNORMAL LOW
Total Bilirubin: 0.2 — ABNORMAL LOW
Total Protein: 7

## 2011-07-31 LAB — URINE MICROSCOPIC-ADD ON

## 2011-07-31 LAB — TROPONIN I: Troponin I: 0.03

## 2011-08-01 LAB — POTASSIUM: Potassium: 3.7

## 2011-08-02 LAB — CBC
Platelets: 342
RBC: 4.88
WBC: 7.3

## 2011-08-02 LAB — DIFFERENTIAL
Lymphocytes Relative: 38
Lymphs Abs: 2.8
Monocytes Relative: 7
Neutro Abs: 3.8
Neutrophils Relative %: 52

## 2011-08-02 LAB — POTASSIUM: Potassium: 3.6

## 2011-08-08 ENCOUNTER — Emergency Department (HOSPITAL_COMMUNITY): Payer: Medicare Other

## 2011-08-08 ENCOUNTER — Encounter (HOSPITAL_COMMUNITY): Payer: Self-pay | Admitting: Emergency Medicine

## 2011-08-08 ENCOUNTER — Emergency Department (HOSPITAL_COMMUNITY)
Admission: EM | Admit: 2011-08-08 | Discharge: 2011-08-08 | Disposition: A | Discharge status: Home / Self Care | Payer: Medicare Other | Attending: Emergency Medicine | Admitting: Emergency Medicine

## 2011-08-08 DIAGNOSIS — W07XXXA Fall from chair, initial encounter: Secondary | ICD-10-CM | POA: Insufficient documentation

## 2011-08-08 DIAGNOSIS — S0990XA Unspecified injury of head, initial encounter: Secondary | ICD-10-CM | POA: Insufficient documentation

## 2011-08-08 DIAGNOSIS — W268XXA Contact with other sharp object(s), not elsewhere classified, initial encounter: Secondary | ICD-10-CM | POA: Insufficient documentation

## 2011-08-08 DIAGNOSIS — T148XXA Other injury of unspecified body region, initial encounter: Secondary | ICD-10-CM | POA: Insufficient documentation

## 2011-08-08 DIAGNOSIS — W01119A Fall on same level from slipping, tripping and stumbling with subsequent striking against unspecified sharp object, initial encounter: Secondary | ICD-10-CM | POA: Insufficient documentation

## 2011-08-08 DIAGNOSIS — Z79899 Other long term (current) drug therapy: Secondary | ICD-10-CM | POA: Insufficient documentation

## 2011-08-08 DIAGNOSIS — E119 Type 2 diabetes mellitus without complications: Secondary | ICD-10-CM | POA: Insufficient documentation

## 2011-08-08 DIAGNOSIS — I10 Essential (primary) hypertension: Secondary | ICD-10-CM | POA: Insufficient documentation

## 2011-08-08 NOTE — ED Provider Notes (Addendum)
History     CSN: [id number] Arrival date & time: 08/08/2011 10:14 AM  Chief Complaint  Patient presents with  . Fall  . Head Injury    (Consider location/radiation/quality/duration/timing/severity/associated sxs/prior treatment) Patient is a 77 y.o. female presenting with fall and head injury. The history is provided by the patient.  Fall The accident occurred less than 1 hour ago. The fall occurred from a stool (she was standing on a chair in her home trying to hang a picture,  when she fell off,  and hit her forehead against her vcr.). She fell from a height of 1 to 2 ft. She landed on a hard floor. The point of impact was the head. The pain is present in the head. The pain is at a severity of 4/10. The pain is moderate. She was ambulatory at the scene. There was no entrapment after the fall. There was no drug use involved in the accident. Pertinent negatives include no visual change, no fever, no numbness, no abdominal pain, no nausea, no vomiting, no headaches, no loss of consciousness and no tingling. Exacerbated by: Forehead contusion pain is worsened with palpation. She has tried nothing for the symptoms.  Head Injury  Pertinent negatives include no numbness, no vomiting and no weakness.    Past Medical History  Diagnosis Date  . Diabetes mellitus   . Hypertension   . Hyperlipidemia   . Fatty liver     on [place] normal LFT'S  . Iron deficiency     Chronic current parameter normal; 4/09 iron was 12 sat 5%  . GERD (gastroesophageal reflux disease)     Past Surgical History  Procedure Date  . Colonoscopy 8/05    internal hemorrhoids,polyp bengin  . Esophagogastroduodenoscopy 6/05    tiny antral erosin with some deformity of pyloric channel  . Vesicovaginal fistula closure w/ tah   . Ankle surgery     Left ;pins    History reviewed. No pertinent family history.  History  Substance Use Topics  . Smoking status: Not on file  . Smokeless tobacco: Not on file  . Alcohol  Use: No    OB History    Grav Para Term Preterm Abortions TAB SAB Ect Mult Living                  Review of Systems  Constitutional: Negative for fever.  HENT: Negative for hearing loss, congestion, sore throat, rhinorrhea, neck pain and neck stiffness.   Eyes: Negative.   Respiratory: Negative for chest tightness and shortness of breath.   Cardiovascular: Negative for chest pain.  Gastrointestinal: Negative for nausea, vomiting and abdominal pain.  Genitourinary: Negative.   Musculoskeletal: Negative for back pain, joint swelling and arthralgias.  Skin: Negative.  Negative for rash and wound.  Neurological: Negative for dizziness, tingling, loss of consciousness, weakness, light-headedness, numbness and headaches.  Hematological: Negative.   Psychiatric/Behavioral: Negative.     Allergies  Iohexol and Milk-related compounds  Home Medications   Current Outpatient Rx  Name Route Sig Dispense Refill  . AMLODIPINE BESYLATE 10 MG PO TABS Oral Take 10 mg by mouth daily.      . ASPIRIN 81 MG PO TABS Oral Take 81 mg by mouth daily.      . ASPIRIN EC 81 MG PO TBEC Oral Take 81 mg by mouth daily.      Marland Kitchen CALCIUM CARBONATE 600 MG PO TABS Oral Take 600 mg by mouth 2 (two) times daily with a meal.      .  COD LIVER OIL PO Oral Take 1 capsule by mouth daily.     Marland Kitchen FERROUS GLUCONATE 325 MG PO TABS Oral Take 325 mg by mouth daily with breakfast.     . OMEGA-3 FATTY ACIDS 1000 MG PO CAPS Oral Take 1 g by mouth daily.     Marland Kitchen HYDROCHLOROTHIAZIDE 25 MG PO TABS Oral Take 12.5 mg by mouth daily.     Marland Kitchen METFORMIN HCL 500 MG PO TABS Oral Take 500 mg by mouth 2 (two) times daily with a meal.      . MULTIVITAMINS PO CAPS Oral Take 1 capsule by mouth daily.      . NEBIVOLOL HCL 2.5 MG PO TABS Oral Take 2.5 mg by mouth daily.      Marland Kitchen OMEPRAZOLE 20 MG PO CPDR Oral Take 20 mg by mouth daily.      Marland Kitchen POTASSIUM CHLORIDE CRYS CR 20 MEQ PO TBCR Oral Take 20 mEq by mouth daily.     Marland Kitchen VITAMIN C 500 MG PO TABS  Oral Take 500 mg by mouth daily.      Marland Kitchen SIMVASTATIN 40 MG PO TABS Oral Take 40 mg by mouth at bedtime.        BP 135/49  Pulse 69  Temp(Src) 97.9 F (36.6 C) (Oral)  Resp 18  Ht 4' 9.5" (1.461 m)  Wt 150 lb (68.04 kg)  BMI 31.90 kg/m2  SpO2 98%  Physical Exam  Nursing note and vitals reviewed. Constitutional: She is oriented to person, place, and time. She appears well-developed and well-nourished.       Uncomfortable appearing  HENT:  Head: Normocephalic.  Mouth/Throat: Oropharynx is clear and moist.       Hematoma right forehead.    Eyes: EOM are normal. Pupils are equal, round, and reactive to light.  Neck: Normal range of motion. Neck supple.  Cardiovascular: Normal rate and normal heart sounds.   Pulmonary/Chest: Effort normal.  Abdominal: Soft. There is no tenderness.  Musculoskeletal: Normal range of motion. She exhibits no edema and no tenderness.       No spine tenderness.   Lymphadenopathy:    She has no cervical adenopathy.  Neurological: She is alert and oriented to person, place, and time. She has normal strength. She displays no tremor (Equal grip strength). No cranial nerve deficit or sensory deficit. She displays a negative Romberg sign. Coordination and gait normal. GCS eye subscore is 4. GCS verbal subscore is 5. GCS motor subscore is 6.  Skin: Skin is warm and dry. No rash noted.  Psychiatric: She has a normal mood and affect. Her speech is normal and behavior is normal. Thought content normal. Cognition and memory are normal.    ED Course  Procedures (including critical care time)  Labs Reviewed - No data to display Ct Head Wo Contrast  08/08/2011  *RADIOLOGY REPORT*  Clinical Data:  Fall with trauma to the right side of the head. Head and neck injury.  Previous vascular stent.  CT HEAD WITHOUT CONTRAST CT CERVICAL SPINE WITHOUT CONTRAST  Technique:  Multidetector CT imaging of the head and cervical spine was performed following the standard protocol  without intravenous contrast.  Multiplanar CT image reconstructions of the cervical spine were also generated.  Comparison:  MRI 12/11/2010.  CT 06/26/2010.  CT HEAD  Findings: The brain shows mild generalized atrophy.  There is no evidence of old or acute infarction, mass lesion, hemorrhage, hydrocephalus or extra-axial collection.  No skull fracture.  No fluid in  the sinuses, middle ears or mastoids. There is right frontal scalp swelling.  I cannot identify an intracranial vascular stent.  IMPRESSION: Normal head CT for age except for right frontal scalp swelling.  No acute or traumatic finding otherwise.  CT CERVICAL SPINE  Findings: Alignment of the spine is normal.  There is no evidence of fracture.  There is chronic degenerative spondylosis throughout the cervical region with disc space narrowing and osteophyte formation.  There is facet degeneration on the left at C4-5.  IMPRESSION: No acute or traumatic finding.  Ordinary degenerative cervical spondylosis.  Left-sided facet degeneration at C4-5.  Original Report Authenticated By: Thomasenia Sales, M.D.   Ct Cervical Spine Wo Contrast  08/08/2011  *RADIOLOGY REPORT*  Clinical Data:  Fall with trauma to the right side of the head. Head and neck injury.  Previous vascular stent.  CT HEAD WITHOUT CONTRAST CT CERVICAL SPINE WITHOUT CONTRAST  Technique:  Multidetector CT imaging of the head and cervical spine was performed following the standard protocol without intravenous contrast.  Multiplanar CT image reconstructions of the cervical spine were also generated.  Comparison:  MRI 12/11/2010.  CT 06/26/2010.  CT HEAD  Findings: The brain shows mild generalized atrophy.  There is no evidence of old or acute infarction, mass lesion, hemorrhage, hydrocephalus or extra-axial collection.  No skull fracture.  No fluid in the sinuses, middle ears or mastoids. There is right frontal scalp swelling.  I cannot identify an intracranial vascular stent.  IMPRESSION: Normal  head CT for age except for right frontal scalp swelling.  No acute or traumatic finding otherwise.  CT CERVICAL SPINE  Findings: Alignment of the spine is normal.  There is no evidence of fracture.  There is chronic degenerative spondylosis throughout the cervical region with disc space narrowing and osteophyte formation.  There is facet degeneration on the left at C4-5.  IMPRESSION: No acute or traumatic finding.  Ordinary degenerative cervical spondylosis.  Left-sided facet degeneration at C4-5.  Original Report Authenticated By: Thomasenia Sales, M.D.     1. Minor head injury   2. Hematoma       MDM  NO acute findings on ct scans and normal neuro exam displayed today.          Candis Musa, PA 08/08/11 1210  Candis Musa, PA 08/11/11 1700

## 2011-08-08 NOTE — ED Provider Notes (Signed)
Medical screening examination/treatment/procedure(s) were conducted as a shared visit with non-physician practitioner(s) and myself.  I personally evaluated the patient during the encounter  Medical screening examination/treatment/procedure(s) were conducted as a shared visit with non-physician practitioner(s) and myself.  I personally evaluated the patient during the encounter He fell off a chair today.   No loc. No n/v/vision changes. No neck pain. No other injuries.  Mild ha. Slight hematoma.  Head ct neg.  Will release. No tx indicated.  Nicholes Stairs, MD 08/08/11 1222   Nicholes Stairs, MD 08/08/11 1225

## 2011-08-08 NOTE — ED Notes (Signed)
Pt states she fell and striking her head on the vcr. Pt denies any loc. Pt states she tripped over her feet.

## 2011-08-08 NOTE — ED Provider Notes (Deleted)
Medical screening examination/treatment/procedure(s) were conducted as a shared visit with non-physician practitioner(s) and myself.  I personally evaluated the patient during the encounter He fell off a chair today.   No loc. No n/v/vision changes. No neck pain. No other injuries.  Mild ha. Slight hematoma.  Head ct neg.  Will release. No tx indicated.  Nicholes Stairs, MD 08/08/11 1222  Nicholes Stairs, MD 08/08/11 1223  Nicholes Stairs, MD 08/08/11 1223

## 2011-08-10 LAB — DIFFERENTIAL
Eosinophils Relative: 2 % (ref 0–5)
Lymphocytes Relative: 35 % (ref 12–46)
Lymphs Abs: 2.4 10*3/uL (ref 0.7–4.0)
Monocytes Relative: 8 % (ref 3–12)

## 2011-08-10 LAB — CBC
HCT: 41 % (ref 36.0–46.0)
Hemoglobin: 13.4 g/dL (ref 12.0–15.0)
Platelets: 398 10*3/uL (ref 150–400)
RBC: 4.72 MIL/uL (ref 3.87–5.11)
WBC: 7 10*3/uL (ref 4.0–10.5)

## 2011-08-16 NOTE — ED Provider Notes (Signed)
Medical screening examination/treatment/procedure(s) were performed by non-physician practitioner and as supervising physician I was immediately available for consultation/collaboration.  Nicholes Stairs, MD 08/16/11 1558

## 2012-01-29 ENCOUNTER — Other Ambulatory Visit (HOSPITAL_COMMUNITY): Payer: Self-pay | Admitting: Internal Medicine

## 2012-01-29 DIAGNOSIS — R51 Headache: Secondary | ICD-10-CM

## 2012-01-31 ENCOUNTER — Ambulatory Visit (HOSPITAL_COMMUNITY)
Admission: RE | Admit: 2012-01-31 | Discharge: 2012-01-31 | Disposition: A | Discharge status: Home / Self Care | Payer: Medicare Other | Source: Ambulatory Visit | Attending: Internal Medicine | Admitting: Internal Medicine

## 2012-01-31 DIAGNOSIS — R51 Headache: Secondary | ICD-10-CM | POA: Insufficient documentation

## 2012-02-08 ENCOUNTER — Other Ambulatory Visit (HOSPITAL_COMMUNITY): Payer: Self-pay | Admitting: Internal Medicine

## 2012-02-08 DIAGNOSIS — Z139 Encounter for screening, unspecified: Secondary | ICD-10-CM

## 2012-04-14 ENCOUNTER — Other Ambulatory Visit (HOSPITAL_COMMUNITY): Payer: Self-pay | Admitting: Internal Medicine

## 2012-04-14 DIAGNOSIS — Z139 Encounter for screening, unspecified: Secondary | ICD-10-CM

## 2012-04-17 ENCOUNTER — Other Ambulatory Visit (HOSPITAL_COMMUNITY): Payer: Medicare Other

## 2012-04-28 ENCOUNTER — Other Ambulatory Visit (HOSPITAL_COMMUNITY): Payer: Medicare Other

## 2012-05-19 ENCOUNTER — Other Ambulatory Visit (HOSPITAL_COMMUNITY): Payer: Self-pay | Admitting: Internal Medicine

## 2012-05-19 DIAGNOSIS — K219 Gastro-esophageal reflux disease without esophagitis: Secondary | ICD-10-CM

## 2012-05-19 DIAGNOSIS — R634 Abnormal weight loss: Secondary | ICD-10-CM

## 2012-05-23 ENCOUNTER — Other Ambulatory Visit (HOSPITAL_COMMUNITY): Payer: Medicare Other

## 2012-05-26 ENCOUNTER — Other Ambulatory Visit (HOSPITAL_COMMUNITY): Payer: Self-pay | Admitting: Internal Medicine

## 2012-05-26 DIAGNOSIS — K219 Gastro-esophageal reflux disease without esophagitis: Secondary | ICD-10-CM

## 2012-05-26 DIAGNOSIS — R634 Abnormal weight loss: Secondary | ICD-10-CM

## 2012-05-27 ENCOUNTER — Ambulatory Visit (HOSPITAL_COMMUNITY)
Admission: RE | Admit: 2012-05-27 | Discharge: 2012-05-27 | Disposition: A | Discharge status: Home / Self Care | Payer: Medicare Other | Source: Ambulatory Visit | Attending: Internal Medicine | Admitting: Internal Medicine

## 2012-05-27 ENCOUNTER — Other Ambulatory Visit (HOSPITAL_COMMUNITY): Payer: Self-pay | Admitting: Internal Medicine

## 2012-05-27 DIAGNOSIS — K219 Gastro-esophageal reflux disease without esophagitis: Secondary | ICD-10-CM

## 2012-05-27 DIAGNOSIS — R634 Abnormal weight loss: Secondary | ICD-10-CM | POA: Insufficient documentation

## 2012-05-27 DIAGNOSIS — R911 Solitary pulmonary nodule: Secondary | ICD-10-CM | POA: Insufficient documentation

## 2012-06-02 ENCOUNTER — Ambulatory Visit (HOSPITAL_COMMUNITY): Payer: Medicare Other

## 2012-06-02 ENCOUNTER — Ambulatory Visit (HOSPITAL_COMMUNITY)
Admission: RE | Admit: 2012-06-02 | Discharge: 2012-06-02 | Disposition: A | Discharge status: Home / Self Care | Payer: Medicare Other | Source: Ambulatory Visit | Attending: Internal Medicine | Admitting: Internal Medicine

## 2012-06-02 DIAGNOSIS — Z139 Encounter for screening, unspecified: Secondary | ICD-10-CM

## 2012-06-02 DIAGNOSIS — Z1231 Encounter for screening mammogram for malignant neoplasm of breast: Secondary | ICD-10-CM | POA: Insufficient documentation

## 2012-06-19 ENCOUNTER — Other Ambulatory Visit (HOSPITAL_COMMUNITY): Payer: Medicare Other

## 2012-06-25 ENCOUNTER — Ambulatory Visit (HOSPITAL_COMMUNITY)
Admission: RE | Admit: 2012-06-25 | Discharge: 2012-06-25 | Disposition: A | Discharge status: Home / Self Care | Payer: Medicare Other | Source: Ambulatory Visit | Attending: Internal Medicine | Admitting: Internal Medicine

## 2012-06-25 DIAGNOSIS — Z139 Encounter for screening, unspecified: Secondary | ICD-10-CM

## 2012-06-25 DIAGNOSIS — Z13820 Encounter for screening for osteoporosis: Secondary | ICD-10-CM | POA: Insufficient documentation

## 2012-06-25 DIAGNOSIS — Z780 Asymptomatic menopausal state: Secondary | ICD-10-CM | POA: Insufficient documentation

## 2012-10-17 ENCOUNTER — Other Ambulatory Visit (HOSPITAL_COMMUNITY): Payer: Self-pay | Admitting: Internal Medicine

## 2012-10-17 DIAGNOSIS — K56699 Other intestinal obstruction unspecified as to partial versus complete obstruction: Secondary | ICD-10-CM

## 2012-10-17 DIAGNOSIS — R109 Unspecified abdominal pain: Secondary | ICD-10-CM

## 2012-10-22 ENCOUNTER — Ambulatory Visit (HOSPITAL_COMMUNITY): Payer: Medicare Other

## 2012-10-27 ENCOUNTER — Ambulatory Visit (HOSPITAL_COMMUNITY)
Admission: RE | Admit: 2012-10-27 | Discharge: 2012-10-27 | Disposition: A | Discharge status: Home / Self Care | Payer: Medicare Other | Source: Ambulatory Visit | Attending: Internal Medicine | Admitting: Internal Medicine

## 2012-10-27 ENCOUNTER — Other Ambulatory Visit (HOSPITAL_COMMUNITY): Payer: Self-pay | Admitting: Internal Medicine

## 2012-10-27 DIAGNOSIS — K219 Gastro-esophageal reflux disease without esophagitis: Secondary | ICD-10-CM | POA: Insufficient documentation

## 2012-10-27 DIAGNOSIS — R109 Unspecified abdominal pain: Secondary | ICD-10-CM | POA: Insufficient documentation

## 2012-10-27 DIAGNOSIS — E119 Type 2 diabetes mellitus without complications: Secondary | ICD-10-CM | POA: Insufficient documentation

## 2012-10-27 DIAGNOSIS — I10 Essential (primary) hypertension: Secondary | ICD-10-CM | POA: Insufficient documentation

## 2012-10-27 DIAGNOSIS — K56699 Other intestinal obstruction unspecified as to partial versus complete obstruction: Secondary | ICD-10-CM

## 2012-12-30 ENCOUNTER — Encounter (HOSPITAL_COMMUNITY): Payer: Self-pay

## 2012-12-30 ENCOUNTER — Emergency Department (HOSPITAL_COMMUNITY)
Admission: EM | Admit: 2012-12-30 | Discharge: 2012-12-30 | Disposition: A | Discharge status: Home / Self Care | Payer: Medicare Other | Attending: Emergency Medicine | Admitting: Emergency Medicine

## 2012-12-30 ENCOUNTER — Emergency Department (HOSPITAL_COMMUNITY): Payer: Medicare Other

## 2012-12-30 DIAGNOSIS — Z79899 Other long term (current) drug therapy: Secondary | ICD-10-CM | POA: Insufficient documentation

## 2012-12-30 DIAGNOSIS — Z7982 Long term (current) use of aspirin: Secondary | ICD-10-CM | POA: Insufficient documentation

## 2012-12-30 DIAGNOSIS — R109 Unspecified abdominal pain: Secondary | ICD-10-CM

## 2012-12-30 DIAGNOSIS — K219 Gastro-esophageal reflux disease without esophagitis: Secondary | ICD-10-CM | POA: Insufficient documentation

## 2012-12-30 DIAGNOSIS — I10 Essential (primary) hypertension: Secondary | ICD-10-CM | POA: Insufficient documentation

## 2012-12-30 DIAGNOSIS — Z8719 Personal history of other diseases of the digestive system: Secondary | ICD-10-CM | POA: Insufficient documentation

## 2012-12-30 DIAGNOSIS — D509 Iron deficiency anemia, unspecified: Secondary | ICD-10-CM | POA: Insufficient documentation

## 2012-12-30 DIAGNOSIS — E785 Hyperlipidemia, unspecified: Secondary | ICD-10-CM | POA: Insufficient documentation

## 2012-12-30 DIAGNOSIS — R1031 Right lower quadrant pain: Secondary | ICD-10-CM | POA: Insufficient documentation

## 2012-12-30 DIAGNOSIS — E119 Type 2 diabetes mellitus without complications: Secondary | ICD-10-CM | POA: Insufficient documentation

## 2012-12-30 LAB — COMPREHENSIVE METABOLIC PANEL
ALT: 7 U/L (ref 0–35)
AST: 21 U/L (ref 0–37)
Albumin: 4.2 g/dL (ref 3.5–5.2)
Alkaline Phosphatase: 81 U/L (ref 39–117)
BUN: 10 mg/dL (ref 6–23)
CO2: 27 mEq/L (ref 19–32)
Calcium: 10.5 mg/dL (ref 8.4–10.5)
Chloride: 100 mEq/L (ref 96–112)
Creatinine, Ser: 0.65 mg/dL (ref 0.50–1.10)
GFR calc Af Amer: >90 mL/min (ref >90)
GFR calc non Af Amer: 84 mL/min — ABNORMAL LOW (ref >90)
Glucose, Bld: 109 mg/dL — ABNORMAL HIGH (ref 70–99)
Potassium: 4.2 mEq/L (ref 3.5–5.1)
Sodium: 139 mEq/L (ref 135–145)
Total Bilirubin: 0.3 mg/dL (ref 0.3–1.2)
Total Protein: 7.8 g/dL (ref 6.0–8.3)

## 2012-12-30 LAB — URINALYSIS, ROUTINE W REFLEX MICROSCOPIC
Bilirubin Urine: NEGATIVE
Glucose, UA: NEGATIVE mg/dL
Hgb urine dipstick: NEGATIVE
Ketones, ur: NEGATIVE mg/dL
Nitrite: NEGATIVE
Specific Gravity, Urine: 1.015 (ref 1.005–1.030)
Urobilinogen, UA: 0.2 mg/dL (ref 0.0–1.0)
pH: 8.5 — ABNORMAL HIGH (ref 5.0–8.0)

## 2012-12-30 LAB — URINE MICROSCOPIC-ADD ON

## 2012-12-30 MED ORDER — MORPHINE SULFATE 4 MG/ML IJ SOLN
4.0000 mg | Freq: Once | INTRAMUSCULAR | Status: AC
  Administered 2012-12-30: 4 mg via INTRAMUSCULAR
  Filled 2012-12-30: qty 1

## 2012-12-30 MED ORDER — ONDANSETRON HCL 4 MG/2ML IJ SOLN
4.0000 mg | Freq: Once | INTRAMUSCULAR | Status: AC
  Administered 2012-12-30: 4 mg via INTRAMUSCULAR
  Filled 2012-12-30: qty 2

## 2012-12-30 MED ORDER — CEPHALEXIN 500 MG PO CAPS
500.0000 mg | ORAL_CAPSULE | Freq: Once | ORAL | Status: AC
  Administered 2012-12-30: 500 mg via ORAL
  Filled 2012-12-30: qty 1

## 2012-12-30 MED ORDER — SODIUM CHLORIDE 0.9 % IV BOLUS (SEPSIS): 1000.0000 mL | Freq: Once | INTRAVENOUS | Status: DC

## 2012-12-30 MED ORDER — ONDANSETRON HCL 4 MG/2ML IJ SOLN: 4.0000 mg | Freq: Once | INTRAMUSCULAR | Status: DC

## 2012-12-30 MED ORDER — MORPHINE SULFATE 4 MG/ML IJ SOLN: 4.0000 mg | Freq: Once | INTRAMUSCULAR | Status: DC

## 2012-12-30 MED ORDER — CEPHALEXIN 500 MG PO CAPS: 500.0000 mg | ORAL_CAPSULE | Freq: Four times a day (QID) | ORAL | Status: DC

## 2012-12-30 NOTE — ED Provider Notes (Signed)
History  This chart was scribed for Raeford Razor, MD, by Candelaria Stagers, ED Scribe. This patient was seen in room [facility] and the patient's care was started at 3:35 PM   CSN: [id number]  Arrival date & time 12/30/12  1436   First MD Initiated Contact with Patient 12/30/12 1507      Chief Complaint  Patient presents with  . Abdominal Pain     The history is provided by the patient. No language interpreter was used.   Taylor Andrews is a 79 y.o. female who presents to the Emergency Department complaining of sharp constant lower abdominal pain that started earlier today after waking.  Nothing seems to make the sx better or worse.  She denies back pain, trouble with urination, fever, chills, nausea, or vomiting.  Pt reports she has never experienced similar sx.  She has h/o hysterectomy, but no other abdominal surgeries.    Past Medical History  Diagnosis Date  . Diabetes mellitus   . Hypertension   . Hyperlipidemia   . Fatty liver     on [place] normal LFT'S  . Iron deficiency     Chronic current parameter normal; 4/09 iron was 12 sat 5%  . GERD (gastroesophageal reflux disease)     Past Surgical History  Procedure Laterality Date  . Colonoscopy  8/05    internal hemorrhoids,polyp bengin  . Esophagogastroduodenoscopy  6/05    tiny antral erosin with some deformity of pyloric channel  . Vesicovaginal fistula closure w/ tah    . Ankle surgery      Left ;pins    No family history on file.  History  Substance Use Topics  . Smoking status: Not on file  . Smokeless tobacco: Not on file  . Alcohol Use: No    OB History   Grav Para Term Preterm Abortions TAB SAB Ect Mult Living                  Review of Systems  Constitutional: Negative for fever and chills.  Gastrointestinal: Positive for abdominal pain. Negative for nausea and vomiting.  Genitourinary: Negative for flank pain and difficulty urinating.  All other systems reviewed and are negative.    Allergies   Iohexol and Milk-related compounds  Home Medications   Current Outpatient Rx  Name  Route  Sig  Dispense  Refill  . amLODipine (NORVASC) 10 MG tablet   Oral   Take 10 mg by mouth daily.           Marland Kitchen aspirin 81 MG tablet   Oral   Take 81 mg by mouth daily.           Marland Kitchen aspirin EC 81 MG tablet   Oral   Take 81 mg by mouth daily.           . calcium carbonate (OS-CAL) 600 MG TABS   Oral   Take 600 mg by mouth 2 (two) times daily with a meal.           . COD LIVER OIL PO   Oral   Take 1 capsule by mouth daily.          . ferrous gluconate (FERGON) 325 MG tablet   Oral   Take 325 mg by mouth daily with breakfast.          . fish oil-omega-3 fatty acids 1000 MG capsule   Oral   Take 1 g by mouth daily.          Marland Kitchen  hydrochlorothiazide 25 MG tablet   Oral   Take 12.5 mg by mouth daily.          . metFORMIN (GLUCOPHAGE) 500 MG tablet   Oral   Take 500 mg by mouth 2 (two) times daily with a meal.           . Multiple Vitamin (MULTIVITAMIN) capsule   Oral   Take 1 capsule by mouth daily.           . nebivolol (BYSTOLIC) 2.5 MG tablet   Oral   Take 2.5 mg by mouth daily.           Marland Kitchen omeprazole (PRILOSEC) 20 MG capsule   Oral   Take 20 mg by mouth daily.           . potassium chloride SA (K-DUR,KLOR-CON) 20 MEQ tablet   Oral   Take 20 mEq by mouth daily.          . simvastatin (ZOCOR) 40 MG tablet   Oral   Take 40 mg by mouth at bedtime.           . vitamin C (ASCORBIC ACID) 500 MG tablet   Oral   Take 500 mg by mouth daily.             BP 144/66  Pulse 87  Temp(Src) 97.9 F (36.6 C) (Oral)  Resp 20  Ht 4' 9.5" (1.461 m)  Wt 128 lb (58.06 kg)  BMI 27.2 kg/m2  SpO2 99%  Physical Exam  Nursing note and vitals reviewed. Constitutional: She is oriented to person, place, and time. She appears well-developed and well-nourished. No distress.  HENT:  Head: Normocephalic and atraumatic.  Eyes: Conjunctivae and EOM are normal.   Neck: Neck supple. No tracheal deviation present.  Cardiovascular: Normal rate.   Pulmonary/Chest: Effort normal. No respiratory distress.  Abdominal: She exhibits no distension. There is tenderness (mild suprapubic tenderness). There is no rebound and no guarding.  No CVA tenderness.  Musculoskeletal: Normal range of motion.  Neurological: She is alert and oriented to person, place, and time. No sensory deficit.  Skin: Skin is dry.  Psychiatric: She has a normal mood and affect. Her behavior is normal.    ED Course  Procedures  DIAGNOSTIC STUDIES: Oxygen Saturation is 99% on room air, normal by my interpretation.    COORDINATION OF CARE:  3:38 PM Discussed course of care with pt at bedside which includes pain medication, images, and blood work.  Pt understands and agrees with plan.    7:25 PM Discussed treatment of UTI which includes antibiotics.  Pt understands and agrees.    Labs Reviewed  COMPREHENSIVE METABOLIC PANEL - Abnormal; Notable for the following:    Glucose, Bld 109 (*)    GFR calc non Af Amer 84 (*)    All other components within normal limits  URINALYSIS, ROUTINE W REFLEX MICROSCOPIC   Ct Abdomen Pelvis Wo Contrast  12/30/2012  *RADIOLOGY REPORT*  Clinical Data: Abdominal pain with nausea.  CT ABDOMEN AND PELVIS WITHOUT CONTRAST  Technique:  Multidetector CT imaging of the abdomen and pelvis was performed following the standard protocol without intravenous contrast.  Comparison: 10/27/2012  Findings: There is no evidence of bowel obstruction, perforation or focal inflammatory process.  The appendix is normal in appearance.  Unenhanced appearance of the solid organs in the abdomen are unremarkable.  No hernias, masses or enlarged lymph nodes are seen. The bladder is unremarkable.  No abnormal calcifications are identified.  Bony structures show mild degenerative changes in the lumbar spine.  IMPRESSION: No acute findings in the abdomen or pelvis.   Original Report  Authenticated By: Irish Lack, M.D.      1. Abdominal pain       MDM  78yF with abdominal pain. Mild suprapubic tenderness on exam, but not peritoneal. CT a/p w/o acute abnormality. Given pt's suprapubic tenderness and somewhat suggestive UA will tx for possible UTI although pt seems to have little in way of urinary symptoms. Low suspicion for emergent etiology of her symptoms. Return precautions discussed.   I personally preformed the services scribed in my presence. The recorded information has been reviewed is accurate. Raeford Razor, MD.         Raeford Razor, MD 01/03/13 [phone]

## 2012-12-30 NOTE — ED Notes (Signed)
Pt reports woke up with abd pain this am.  Denies any n/v/d.  LBM was yesterday and was normal per pt.  Pt went to Dr. Regino Schultze today and was sent here.

## 2012-12-30 NOTE — ED Notes (Signed)
ok'd with EDP for pt to drink fluids

## 2012-12-30 NOTE — ED Notes (Signed)
Pt with RLQ abd pain since this morning, denies N/v/D or constipation

## 2012-12-30 NOTE — ED Notes (Signed)
Dr. Regino Schultze called report to Dr. Manus Gunning.  Spoke with Dr. Manus Gunning and said pt is ok to wait in waiting room as long as vitals are stable.

## 2012-12-30 NOTE — ED Notes (Signed)
Unable to get IV access after multiple attempts by two nurses

## 2012-12-30 NOTE — ED Notes (Signed)
MD at bedside. 

## 2012-12-30 NOTE — ED Notes (Signed)
Lab unable to draw enough blood for CBC but was able to run CMP

## 2012-12-31 LAB — URINE CULTURE
Colony Count: NO GROWTH
Culture: NO GROWTH

## 2013-02-03 ENCOUNTER — Emergency Department (HOSPITAL_COMMUNITY)
Admission: EM | Admit: 2013-02-03 | Discharge: 2013-02-04 | Disposition: A | Discharge status: Home / Self Care | Payer: Medicare Other | Attending: Emergency Medicine | Admitting: Emergency Medicine

## 2013-02-03 ENCOUNTER — Encounter (HOSPITAL_COMMUNITY): Payer: Self-pay | Admitting: *Deleted

## 2013-02-03 ENCOUNTER — Emergency Department (HOSPITAL_COMMUNITY): Payer: Medicare Other

## 2013-02-03 DIAGNOSIS — R1084 Generalized abdominal pain: Secondary | ICD-10-CM | POA: Insufficient documentation

## 2013-02-03 DIAGNOSIS — Z79899 Other long term (current) drug therapy: Secondary | ICD-10-CM | POA: Insufficient documentation

## 2013-02-03 DIAGNOSIS — D509 Iron deficiency anemia, unspecified: Secondary | ICD-10-CM | POA: Insufficient documentation

## 2013-02-03 DIAGNOSIS — K219 Gastro-esophageal reflux disease without esophagitis: Secondary | ICD-10-CM | POA: Insufficient documentation

## 2013-02-03 DIAGNOSIS — E785 Hyperlipidemia, unspecified: Secondary | ICD-10-CM | POA: Insufficient documentation

## 2013-02-03 DIAGNOSIS — Z7982 Long term (current) use of aspirin: Secondary | ICD-10-CM | POA: Insufficient documentation

## 2013-02-03 DIAGNOSIS — I10 Essential (primary) hypertension: Secondary | ICD-10-CM | POA: Insufficient documentation

## 2013-02-03 DIAGNOSIS — Z8719 Personal history of other diseases of the digestive system: Secondary | ICD-10-CM | POA: Insufficient documentation

## 2013-02-03 DIAGNOSIS — E119 Type 2 diabetes mellitus without complications: Secondary | ICD-10-CM | POA: Insufficient documentation

## 2013-02-03 DIAGNOSIS — R109 Unspecified abdominal pain: Secondary | ICD-10-CM

## 2013-02-03 LAB — URINALYSIS, ROUTINE W REFLEX MICROSCOPIC
Nitrite: NEGATIVE
Specific Gravity, Urine: 1.015 (ref 1.005–1.030)
Urobilinogen, UA: 0.2 mg/dL (ref 0.0–1.0)
pH: 5.5 (ref 5.0–8.0)

## 2013-02-03 LAB — CBC WITH DIFFERENTIAL/PLATELET
Eosinophils Relative: 1 % (ref 0–5)
HCT: 42.9 % (ref 36.0–46.0)
Hemoglobin: 14.3 g/dL (ref 12.0–15.0)
Lymphs Abs: 2.8 10*3/uL (ref 0.7–4.0)
MCV: 89.4 fL (ref 78.0–100.0)
Monocytes Relative: 8 % (ref 3–12)
Neutro Abs: 3.3 10*3/uL (ref 1.7–7.7)
RBC: 4.8 MIL/uL (ref 3.87–5.11)
WBC: 6.7 10*3/uL (ref 4.0–10.5)

## 2013-02-03 LAB — COMPREHENSIVE METABOLIC PANEL
AST: 19 U/L (ref 0–37)
Alkaline Phosphatase: 78 U/L (ref 39–117)
BUN: 11 mg/dL (ref 6–23)
CO2: 23 mEq/L (ref 19–32)
Chloride: 100 mEq/L (ref 96–112)
Creatinine, Ser: 0.74 mg/dL (ref 0.50–1.10)
GFR calc non Af Amer: 81 mL/min — ABNORMAL LOW (ref >90)
Total Bilirubin: 0.3 mg/dL (ref 0.3–1.2)

## 2013-02-03 LAB — LACTIC ACID, PLASMA: Lactic Acid, Venous: 2.7 mmol/L — ABNORMAL HIGH (ref 0.5–2.2)

## 2013-02-03 LAB — URINE MICROSCOPIC-ADD ON

## 2013-02-03 LAB — LIPASE, BLOOD: Lipase: 75 U/L — ABNORMAL HIGH (ref 11–59)

## 2013-02-03 MED ORDER — SODIUM CHLORIDE 0.9 % IV SOLN
INTRAVENOUS | Status: DC
  Administered 2013-02-03: 20:00:00 via INTRAVENOUS

## 2013-02-03 MED ORDER — SODIUM CHLORIDE 0.9 % IV BOLUS (SEPSIS)
500.0000 mL | Freq: Once | INTRAVENOUS | Status: AC
  Administered 2013-02-04: 500 mL via INTRAVENOUS

## 2013-02-03 NOTE — ED Notes (Signed)
Pt c/o lower abdominal pain x2-3 days. Pt states 1 month ago she was diagnosed with UTI and given abx tx. Pt states symptoms improved until "a couple days ago". Pt denies dysuria and polyuria. Pt denies NVD.

## 2013-02-03 NOTE — ED Provider Notes (Signed)
History     CSN: [id number]  Arrival date & time 02/03/13  1745   First MD Initiated Contact with Patient 02/03/13 1940      Chief Complaint  Patient presents with  . Abdominal Pain    HPI Pt was seen at 1945.   Per pt, c/o gradual onset and persistence of constant generalized abd "pain" for the past 2 to 3 days.  Describes the abd pain as "like I had about a month ago."  Pt was eval in the ED approx 1 month ago for same symptoms, dx UTI.  Denies N/V/D, no dysuria/hematuria.  Denies fevers, no back pain, no rash, no CP/SOB, no black or blood in stools.       Past Medical History  Diagnosis Date  . Diabetes mellitus   . Hypertension   . Hyperlipidemia   . Fatty liver     on [place] normal LFT'S  . Iron deficiency     Chronic current parameter normal; 4/09 iron was 12 sat 5%  . GERD (gastroesophageal reflux disease)     Past Surgical History  Procedure Laterality Date  . Colonoscopy  8/05    internal hemorrhoids,polyp bengin  . Esophagogastroduodenoscopy  6/05    tiny antral erosin with some deformity of pyloric channel  . Vesicovaginal fistula closure w/ tah    . Ankle surgery      Left ;pins    History  Substance Use Topics  . Smoking status: Never Smoker   . Smokeless tobacco: Not on file  . Alcohol Use: No    Review of Systems ROS: Statement: All systems negative except as marked or noted in the HPI; Constitutional: Negative for fever and chills. ; ; Eyes: Negative for eye pain, redness and discharge. ; ; ENMT: Negative for ear pain, hoarseness, nasal congestion, sinus pressure and sore throat. ; ; Cardiovascular: Negative for chest pain, palpitations, diaphoresis, dyspnea and peripheral edema. ; ; Respiratory: Negative for cough, wheezing and stridor. ; ; Gastrointestinal: +abd pain. Negative for nausea, vomiting, diarrhea, blood in stool, hematemesis, jaundice and rectal bleeding. . ; ; Genitourinary: Negative for dysuria, flank pain and hematuria. ; ; GYN:  No vaginal  bleeding, no vaginal discharge, no vulvar pain.;; Musculoskeletal: Negative for back pain and neck pain. Negative for swelling and trauma.; ; Skin: Negative for pruritus, rash, abrasions, blisters, bruising and skin lesion.; ; Neuro: Negative for headache, lightheadedness and neck stiffness. Negative for weakness, altered level of consciousness , altered mental status, extremity weakness, paresthesias, involuntary movement, seizure and syncope.       Allergies  Iohexol and Milk-related compounds  Home Medications   Current Outpatient Rx  Name  Route  Sig  Dispense  Refill  . amLODipine (NORVASC) 10 MG tablet   Oral   Take 10 mg by mouth daily.           Marland Kitchen aspirin EC 81 MG tablet   Oral   Take 81 mg by mouth every morning.          . colesevelam (WELCHOL) 625 MG tablet   Oral   Take 625 mg by mouth daily.         Marland Kitchen donepezil (ARICEPT) 5 MG tablet   Oral   Take 5 mg by mouth.         . ferrous gluconate (FERGON) 325 MG tablet   Oral   Take 325 mg by mouth daily with breakfast.          . hydrochlorothiazide  25 MG tablet   Oral   Take 12.5 mg by mouth every morning.          . metFORMIN (GLUCOPHAGE-XR) 500 MG 24 hr tablet   Oral   Take 500 mg by mouth every morning.          . Multiple Vitamin (MULTIVITAMIN) capsule   Oral   Take 1 capsule by mouth every morning.          Marland Kitchen omeprazole (PRILOSEC) 20 MG capsule   Oral   Take 20 mg by mouth every morning.          . potassium chloride SA (K-DUR,KLOR-CON) 20 MEQ tablet   Oral   Take 20 mEq by mouth daily.          . vitamin C (ASCORBIC ACID) 500 MG tablet   Oral   Take 500 mg by mouth every morning.            BP 119/54  Pulse 77  Temp(Src) 98.1 F (36.7 C) (Oral)  Resp 18  Ht 4' 9.5" (1.461 m)  SpO2 98%  Physical Exam 1950: Physical examination:  Nursing notes reviewed; Vital signs and O2 SAT reviewed;  Constitutional: Well developed, Well nourished, Well hydrated, In no acute  distress; Head:  Normocephalic, atraumatic; Eyes: EOMI, PERRL, No scleral icterus; ENMT: Mouth and pharynx normal, Mucous membranes moist; Neck: Supple, Full range of motion, No lymphadenopathy; Cardiovascular: Regular rate and rhythm, No gallop; Respiratory: Breath sounds clear & equal bilaterally, No rales, rhonchi, wheezes.  Speaking full sentences with ease, Normal respiratory effort/excursion; Chest: Nontender, Movement normal; Abdomen: Soft, +mild diffuse tenderness to palp. No rebound or guarding. Nondistended, Normal bowel sounds; Genitourinary: No CVA tenderness; Extremities: Pulses normal, No tenderness, No edema, No calf edema or asymmetry.; Neuro: AA&Ox3, Major CN grossly intact.  Speech clear. No gross focal motor or sensory deficits in extremities.; Skin: Color normal, Warm, Dry.   ED Course  Procedures    MDM  MDM Reviewed: previous chart, nursing note and vitals Reviewed previous: labs Interpretation: ECG, labs, x-ray and CT scan    Date: 02/03/2013  Rate: 78  Rhythm: normal sinus rhythm  QRS Axis: normal  Intervals: QT prolonged  ST/T Wave abnormalities: nonspecific T wave changes  Conduction Disutrbances:none  Narrative Interpretation:   Old EKG Reviewed: unchanged; no significant changes from previous EKG dated 02/10/2008.  Results for orders placed during the hospital encounter of 02/03/13  URINALYSIS, ROUTINE W REFLEX MICROSCOPIC      Result Value Range   Color, Urine STRAW (*) YELLOW   APPearance CLEAR  CLEAR   Specific Gravity, Urine 1.015  1.005 - 1.030   pH 5.5  5.0 - 8.0   Glucose, UA NEGATIVE  NEGATIVE mg/dL   Hgb urine dipstick NEGATIVE  NEGATIVE   Bilirubin Urine NEGATIVE  NEGATIVE   Ketones, ur NEGATIVE  NEGATIVE mg/dL   Protein, ur NEGATIVE  NEGATIVE mg/dL   Urobilinogen, UA 0.2  0.0 - 1.0 mg/dL   Nitrite NEGATIVE  NEGATIVE   Leukocytes, UA SMALL (*) NEGATIVE  CBC WITH DIFFERENTIAL      Result Value Range   WBC 6.7  4.0 - 10.5 K/uL   RBC 4.80   3.87 - 5.11 MIL/uL   Hemoglobin 14.3  12.0 - 15.0 g/dL   HCT 16.1  09.6 - 04.5 %   MCV 89.4  78.0 - 100.0 fL   MCH 29.8  26.0 - 34.0 pg   MCHC 33.3  30.0 - 36.0  g/dL   RDW 08.6  57.8 - 46.9 %   Platelets 296  150 - 400 K/uL   Neutrophils Relative 49  43 - 77 %   Lymphocytes Relative 42  12 - 46 %   Monocytes Relative 8  3 - 12 %   Eosinophils Relative 1  0 - 5 %   Basophils Relative 0  0 - 1 %   Neutro Abs 3.3  1.7 - 7.7 K/uL   Lymphs Abs 2.8  0.7 - 4.0 K/uL   Monocytes Absolute 0.5  0.1 - 1.0 K/uL   Eosinophils Absolute 0.1  0.0 - 0.7 K/uL   Basophils Absolute 0.0  0.0 - 0.1 K/uL   WBC Morphology ATYPICAL LYMPHOCYTES    COMPREHENSIVE METABOLIC PANEL      Result Value Range   Sodium 138  135 - 145 mEq/L   Potassium 3.6  3.5 - 5.1 mEq/L   Chloride 100  96 - 112 mEq/L   CO2 23  19 - 32 mEq/L   Glucose, Bld 77  70 - 99 mg/dL   BUN 11  6 - 23 mg/dL   Creatinine, Ser 6.29  0.50 - 1.10 mg/dL   Calcium 52.8  8.4 - 41.3 mg/dL   Total Protein 7.6  6.0 - 8.3 g/dL   Albumin 4.3  3.5 - 5.2 g/dL   AST 19  0 - 37 U/L   ALT 10  0 - 35 U/L   Alkaline Phosphatase 78  39 - 117 U/L   Total Bilirubin 0.3  0.3 - 1.2 mg/dL   GFR calc non Af Amer 81 (*) >90 mL/min   GFR calc Af Amer >90  >90 mL/min  LIPASE, BLOOD      Result Value Range   Lipase 75 (*) 11 - 59 U/L  LACTIC ACID, PLASMA      Result Value Range   Lactic Acid, Venous 2.7 (*) 0.5 - 2.2 mmol/L  TROPONIN I      Result Value Range   Troponin I <0.30  <0.30 ng/mL  URINE MICROSCOPIC-ADD ON      Result Value Range   Squamous Epithelial / LPF FEW (*) RARE   WBC, UA 3-6  <3 WBC/hpf   RBC / HPF 0-2  <3 RBC/hpf   Bacteria, UA RARE  RARE   Casts HYALINE CASTS (*) NEGATIVE   Ct Abdomen Pelvis Wo Contrast 02/03/2013  *RADIOLOGY REPORT*  Clinical Data: Abdominal pain.  Contrast allergy.  CT ABDOMEN AND PELVIS WITHOUT CONTRAST  Technique:  Multidetector CT imaging of the abdomen and pelvis was performed following the standard protocol  without intravenous contrast.  Comparison: 12/30/2012  Findings: Lack of intravenous contrast limits the study.  Gallbladder is decompressed.  Liver, spleen, pancreas, kidneys are within normal limits.  Prominence of the adrenal glands with nodularity bilaterally is stable.  Normal appendix.  No free fluid.  No obvious abnormal adenopathy.  Mild atherosclerotic changes of the aorta and iliac vessels.  Bladder is within normal limits.  Uterus absent.  Adnexa are within normal limits.  Advanced degenerative disc disease at L4-5.  Advanced facet arthropathy at L5 S1.  No acute bony deformity.  IMPRESSION: No acute intra-abdominal pathology.   Original Report Authenticated By: Jolaine Click, M.D.    Dg Chest 2 View 02/03/2013  *RADIOLOGY REPORT*  Clinical Data: Abdominal pain  CHEST - 2 VIEW  Comparison: 02/10/2008  Findings: Normal heart size.  No pneumothorax or pleural effusion. Degenerative changes in the thoracic spine.  Clear lungs.  IMPRESSION: No active cardiopulmonary disease.   Original Report Authenticated By: Jolaine Click, M.D.     0030:  VS remain stable, afebrile.  Lipase and lactic acid minimally and non-specifically elevated. Has tol PO well without N/V. No stooling while in the ED.  No clear UTI on Udip, UC is pending. IVF given, will repeat lactic acid level.  Sign out to Dr. Bebe Shaggy.             Laray Anger, DO 02/04/13 0031

## 2013-02-03 NOTE — ED Notes (Signed)
Pt lower abd pain that started today, states that she was recently seen for same type pain, diagnosed with uti, pain became better but has returned,

## 2013-02-03 NOTE — ED Notes (Signed)
Gave EKG to Dr

## 2013-02-04 LAB — LACTIC ACID, PLASMA: Lactic Acid, Venous: 1.8 mmol/L (ref 0.5–2.2)

## 2013-02-04 NOTE — ED Provider Notes (Signed)
Labs/ekg reviewed Pt reports improvement and she wants to go home Stable for d/c at this time Advised she can return for any return of pain   Joya Gaskins, MD 02/04/13 [phone]

## 2013-02-05 LAB — URINE CULTURE

## 2013-02-12 ENCOUNTER — Emergency Department (HOSPITAL_COMMUNITY): Payer: Medicare Other

## 2013-02-12 ENCOUNTER — Encounter (HOSPITAL_COMMUNITY): Payer: Self-pay

## 2013-02-12 ENCOUNTER — Emergency Department (HOSPITAL_COMMUNITY)
Admission: EM | Admit: 2013-02-12 | Discharge: 2013-02-12 | Disposition: A | Discharge status: Home / Self Care | Payer: Medicare Other | Attending: Emergency Medicine | Admitting: Emergency Medicine

## 2013-02-12 DIAGNOSIS — R197 Diarrhea, unspecified: Secondary | ICD-10-CM | POA: Insufficient documentation

## 2013-02-12 DIAGNOSIS — R112 Nausea with vomiting, unspecified: Secondary | ICD-10-CM | POA: Insufficient documentation

## 2013-02-12 DIAGNOSIS — Z8719 Personal history of other diseases of the digestive system: Secondary | ICD-10-CM | POA: Insufficient documentation

## 2013-02-12 DIAGNOSIS — E119 Type 2 diabetes mellitus without complications: Secondary | ICD-10-CM | POA: Insufficient documentation

## 2013-02-12 DIAGNOSIS — D509 Iron deficiency anemia, unspecified: Secondary | ICD-10-CM | POA: Insufficient documentation

## 2013-02-12 DIAGNOSIS — R1084 Generalized abdominal pain: Secondary | ICD-10-CM | POA: Insufficient documentation

## 2013-02-12 DIAGNOSIS — K219 Gastro-esophageal reflux disease without esophagitis: Secondary | ICD-10-CM | POA: Insufficient documentation

## 2013-02-12 DIAGNOSIS — Z7982 Long term (current) use of aspirin: Secondary | ICD-10-CM | POA: Insufficient documentation

## 2013-02-12 DIAGNOSIS — E785 Hyperlipidemia, unspecified: Secondary | ICD-10-CM | POA: Insufficient documentation

## 2013-02-12 DIAGNOSIS — Z79899 Other long term (current) drug therapy: Secondary | ICD-10-CM | POA: Insufficient documentation

## 2013-02-12 DIAGNOSIS — R109 Unspecified abdominal pain: Secondary | ICD-10-CM

## 2013-02-12 DIAGNOSIS — I10 Essential (primary) hypertension: Secondary | ICD-10-CM | POA: Insufficient documentation

## 2013-02-12 LAB — COMPREHENSIVE METABOLIC PANEL
ALT: 10 U/L (ref 0–35)
AST: 17 U/L (ref 0–37)
Albumin: 4.2 g/dL (ref 3.5–5.2)
Alkaline Phosphatase: 73 U/L (ref 39–117)
CO2: 21 mEq/L (ref 19–32)
Chloride: 93 mEq/L — ABNORMAL LOW (ref 96–112)
Creatinine, Ser: 1.19 mg/dL — ABNORMAL HIGH (ref 0.50–1.10)
GFR calc non Af Amer: 43 mL/min — ABNORMAL LOW (ref >90)
Potassium: 3.3 mEq/L — ABNORMAL LOW (ref 3.5–5.1)
Sodium: 135 mEq/L (ref 135–145)
Total Bilirubin: 0.7 mg/dL (ref 0.3–1.2)

## 2013-02-12 LAB — CBC WITH DIFFERENTIAL/PLATELET
Basophils Absolute: 0 10*3/uL (ref 0.0–0.1)
Basophils Relative: 0 % (ref 0–1)
HCT: 34.9 % — ABNORMAL LOW (ref 36.0–46.0)
Lymphocytes Relative: 10 % — ABNORMAL LOW (ref 12–46)
Monocytes Absolute: 0.6 10*3/uL (ref 0.1–1.0)
Neutro Abs: 12 10*3/uL — ABNORMAL HIGH (ref 1.7–7.7)
Neutrophils Relative %: 86 % — ABNORMAL HIGH (ref 43–77)
RDW: 13.6 % (ref 11.5–15.5)
WBC: 13.9 10*3/uL — ABNORMAL HIGH (ref 4.0–10.5)

## 2013-02-12 MED ORDER — DICYCLOMINE HCL 20 MG PO TABS: ORAL_TABLET | ORAL | Status: DC

## 2013-02-12 NOTE — ED Notes (Signed)
Stomach is bothering me, had diarrhea and vomiting per pt.

## 2013-02-12 NOTE — ED Provider Notes (Signed)
History    This chart was scribed for Benny Lennert, MD by Marlyne Beards, ED Scribe. The patient was seen in room [facility]16A/[facility]A. Patient's care was started at 9:22 PM.   CSN: [id number]  Arrival date & time 02/12/13  2106   First MD Initiated Contact with Patient 02/12/13 2122      Chief Complaint  Patient presents with  . Abdominal Pain  . Emesis  . Diarrhea    (Consider location/radiation/quality/duration/timing/severity/associated sxs/prior treatment) Patient is a 79 y.o. female presenting with abdominal pain, vomiting, and diarrhea. The history is provided by the patient and a relative. No language interpreter was used.  Abdominal Pain Pain location:  Generalized Pain severity:  Moderate Progression:  Waxing and waning Associated symptoms: diarrhea, nausea and vomiting   Associated symptoms: no chest pain, no cough, no fatigue and no hematuria   Diarrhea:    Quality:  Watery   Severity:  Moderate   Timing:  Intermittent Vomiting:    Severity:  Moderate   Timing:  Intermittent Emesis Associated symptoms: abdominal pain and diarrhea   Associated symptoms: no headaches   Diarrhea Associated symptoms: abdominal pain and vomiting   Associated symptoms: no headaches    Taylor Andrews is a 79 y.o. female with h/o DM and HTN who presents to the Emergency Department complaining of moderate waxing and waning abdominal pain onset yesterday. Pt states that she has had 2 episodes of emesis and 1 episode of diarrhea since onset. Pt's states she feel fine currently in the ED. Pt denies fever, chills, cough, SOB, weakness, and any other associated symptoms. Pt's PCP is Dr.Fusco.  Past Medical History  Diagnosis Date  . Diabetes mellitus   . Hypertension   . Hyperlipidemia   . Fatty liver     on [place] normal LFT'S  . Iron deficiency     Chronic current parameter normal; 4/09 iron was 12 sat 5%  . GERD (gastroesophageal reflux disease)     Past Surgical History  Procedure  Laterality Date  . Colonoscopy  8/05    internal hemorrhoids,polyp bengin  . Esophagogastroduodenoscopy  6/05    tiny antral erosin with some deformity of pyloric channel  . Vesicovaginal fistula closure w/ tah    . Ankle surgery      Left ;pins    No family history on file.  History  Substance Use Topics  . Smoking status: Never Smoker   . Smokeless tobacco: Not on file  . Alcohol Use: No    OB History   Grav Para Term Preterm Abortions TAB SAB Ect Mult Living                  Review of Systems  Constitutional: Negative for fatigue.  HENT: Negative for congestion, sinus pressure and ear discharge.   Eyes: Negative for discharge.  Respiratory: Negative for cough.   Cardiovascular: Negative for chest pain.  Gastrointestinal: Positive for nausea, vomiting, abdominal pain and diarrhea.  Genitourinary: Negative for frequency and hematuria.  Musculoskeletal: Negative for back pain.  Skin: Negative for rash.  Neurological: Negative for seizures and headaches.  Psychiatric/Behavioral: Negative for hallucinations.    Allergies  Iohexol and Milk-related compounds  Home Medications   Current Outpatient Rx  Name  Route  Sig  Dispense  Refill  . amLODipine (NORVASC) 10 MG tablet   Oral   Take 10 mg by mouth daily.           Marland Kitchen aspirin EC 81 MG tablet  Oral   Take 81 mg by mouth every morning.          . colesevelam (WELCHOL) 625 MG tablet   Oral   Take 625 mg by mouth daily.         Marland Kitchen donepezil (ARICEPT) 5 MG tablet   Oral   Take 5 mg by mouth.         . ferrous gluconate (FERGON) 325 MG tablet   Oral   Take 325 mg by mouth daily with breakfast.          . hydrochlorothiazide 25 MG tablet   Oral   Take 12.5 mg by mouth every morning.          . metFORMIN (GLUCOPHAGE-XR) 500 MG 24 hr tablet   Oral   Take 500 mg by mouth every morning.          . Multiple Vitamin (MULTIVITAMIN) capsule   Oral   Take 1 capsule by mouth every morning.           Marland Kitchen omeprazole (PRILOSEC) 20 MG capsule   Oral   Take 20 mg by mouth every morning.          . potassium chloride SA (K-DUR,KLOR-CON) 20 MEQ tablet   Oral   Take 20 mEq by mouth daily.          . vitamin C (ASCORBIC ACID) 500 MG tablet   Oral   Take 500 mg by mouth every morning.            BP 118/44  Pulse 117  Temp(Src) 98.1 F (36.7 C) (Oral)  Resp 20  Ht 4\' 11"  (1.499 m)  Wt 120 lb (54.432 kg)  BMI 24.22 kg/m2  SpO2 100%  Physical Exam  Nursing note and vitals reviewed. Constitutional: She is oriented to person, place, and time. She appears well-developed.  HENT:  Head: Normocephalic.  Eyes: Conjunctivae and EOM are normal. No scleral icterus.  Neck: Neck supple. No thyromegaly present.  Cardiovascular: Normal rate and regular rhythm.  Exam reveals no gallop and no friction rub.   No murmur heard. Pulmonary/Chest: No stridor. She has no wheezes. She has no rales. She exhibits no tenderness.  Abdominal: She exhibits no distension. There is no tenderness. There is no rebound.  Musculoskeletal: Normal range of motion. She exhibits no edema.  Lymphadenopathy:    She has no cervical adenopathy.  Neurological: She is oriented to person, place, and time. Coordination normal.  Skin: No rash noted. No erythema.  Psychiatric: She has a normal mood and affect. Her behavior is normal.    ED Course  Procedures (including critical care time) DIAGNOSTIC STUDIES: Oxygen Saturation is 100% on room air, normal by my interpretation.    COORDINATION OF CARE: 9:27 PM Discussed ED treatment with pt and pt agrees.     Labs Reviewed  CBC WITH DIFFERENTIAL  COMPREHENSIVE METABOLIC PANEL   No results found.   No diagnosis found.    MDM   The chart was scribed for me under my direct supervision.  I personally performed the history, physical, and medical decision making and all procedures in the evaluation of this patient.Benny Lennert,  MD 02/12/13 [phone]

## 2013-02-12 NOTE — ED Notes (Signed)
Discharge instructions given and reviewed with patient and family.  Prescription given for Bentyl; effects and use explained.  Patient verbalized understanding to take medication as prescribed and to keep scheduled appointment with MD next week.  Patient ambulatory; discharged home in good condition.

## 2013-02-23 ENCOUNTER — Other Ambulatory Visit (HOSPITAL_COMMUNITY): Payer: Self-pay | Admitting: Internal Medicine

## 2013-02-25 ENCOUNTER — Other Ambulatory Visit (HOSPITAL_COMMUNITY): Payer: Self-pay | Admitting: Internal Medicine

## 2013-02-25 DIAGNOSIS — R109 Unspecified abdominal pain: Secondary | ICD-10-CM

## 2013-02-27 ENCOUNTER — Ambulatory Visit (HOSPITAL_COMMUNITY): Payer: Medicare Other

## 2013-03-04 ENCOUNTER — Ambulatory Visit (HOSPITAL_COMMUNITY)
Admission: RE | Admit: 2013-03-04 | Discharge: 2013-03-04 | Disposition: A | Discharge status: Home / Self Care | Payer: Medicare Other | Source: Ambulatory Visit | Attending: Internal Medicine | Admitting: Internal Medicine

## 2013-03-04 ENCOUNTER — Other Ambulatory Visit (HOSPITAL_COMMUNITY): Payer: Self-pay | Admitting: Internal Medicine

## 2013-03-04 DIAGNOSIS — R109 Unspecified abdominal pain: Secondary | ICD-10-CM

## 2013-03-05 ENCOUNTER — Other Ambulatory Visit (HOSPITAL_COMMUNITY): Payer: Medicare Other

## 2013-03-16 ENCOUNTER — Encounter: Payer: Self-pay | Admitting: Internal Medicine

## 2013-03-31 ENCOUNTER — Encounter: Payer: Self-pay | Admitting: Internal Medicine

## 2013-04-01 ENCOUNTER — Ambulatory Visit (INDEPENDENT_AMBULATORY_CARE_PROVIDER_SITE_OTHER): Payer: Medicare Other | Admitting: Gastroenterology

## 2013-04-01 ENCOUNTER — Encounter: Payer: Self-pay | Admitting: Gastroenterology

## 2013-04-01 VITALS — BP 145/63 | HR 74 | Temp 97.6°F | Ht <= 58 in | Wt 125.6 lb

## 2013-04-01 DIAGNOSIS — D649 Anemia, unspecified: Secondary | ICD-10-CM

## 2013-04-01 DIAGNOSIS — R109 Unspecified abdominal pain: Secondary | ICD-10-CM

## 2013-04-01 NOTE — Patient Instructions (Addendum)
1. Please keep a diary of your abdominal pain if it returns. Document whether or not the pain is worsened by food, worse if you have not had a good bowel movement, awakens you at nighttime. Please call me if your pain returns. At that point, we would consider completing your gallbladder work up if appropriate versus upper endoscopy.

## 2013-04-01 NOTE — Progress Notes (Signed)
Primary Care Physician:  Cassell Smiles., MD  Primary Gastroenterologist:  Roetta Sessions, MD   Chief Complaint  Patient presents with  . Abdominal Pain    HPI:  Taylor Andrews is a 79 y.o. female here at request of PCP for further evaluation of lower abdominal pain. She has had four CTs of the A/P without contrast in the last 12 months without significant findings. Abd u/s recently as outlined below. Intermittent abdominal pain. None in several weeks. We it occurs it is fairly generalized. With or without meals. BM qod. Soft stools. No melena, brbpr. No weight loss. She has h/o advanced colon polyps with high grade dysplasia. Last TCS 01/2011. Repeat colonoscopy due in 01/2016.  Forty pounds weight loss over several years. She weighed 170 pound in 05/2009.   Current Outpatient Prescriptions  Medication Sig Dispense Refill  . amLODipine (NORVASC) 10 MG tablet Take 10 mg by mouth daily.        Marland Kitchen aspirin EC 81 MG tablet Take 81 mg by mouth every morning.       . colesevelam (WELCHOL) 625 MG tablet Take 625 mg by mouth daily.      Marland Kitchen dicyclomine (BENTYL) 20 MG tablet Take one every 6 hours for abdominal cramps  20 tablet  0  . donepezil (ARICEPT) 5 MG tablet Take 5 mg by mouth at bedtime.       . ferrous gluconate (FERGON) 325 MG tablet Take 325 mg by mouth daily with breakfast.       . hydrochlorothiazide 25 MG tablet Take 12.5 mg by mouth every morning.       . metFORMIN (GLUCOPHAGE-XR) 500 MG 24 hr tablet Take 500 mg by mouth every morning.       . Multiple Vitamin (MULTIVITAMIN) capsule Take 1 capsule by mouth every morning.       . potassium chloride SA (K-DUR,KLOR-CON) 20 MEQ tablet Take 20 mEq by mouth every morning.       . vitamin C (ASCORBIC ACID) 500 MG tablet Take 500 mg by mouth every morning.        No current facility-administered medications for this visit.    Allergies as of 04/01/2013 - Review Complete 04/01/2013  Allergen Reaction Noted  . Iohexol  03/08/2008  .  Milk-related compounds  06/02/2009    Past Medical History  Diagnosis Date  . Diabetes mellitus   . Hypertension   . Hyperlipidemia   . Fatty liver     on [place] normal LFT'S  . Iron deficiency     Chronic current parameter normal; 4/09 iron was 12 sat 5%  . GERD (gastroesophageal reflux disease)   . Adenomatous colon polyp     carcinoma in situ    Past Surgical History  Procedure Laterality Date  . Colonoscopy  8/05    internal hemorrhoids,polyp bengin  . Esophagogastroduodenoscopy  6/05    tiny antral erosin with some deformity of pyloric channel  . Vesicovaginal fistula closure w/ tah    . Ankle surgery      Left ;pins  . Colonoscopy  01/12/2011    WUJ:WJXBJY rectum/ 5-mm polyp at the splenic flexure s/p polypectomy, tattooing, and scarred hepatic flexure, absolutely no evidence of any residual polyp tissue or neoplasm/Scattered left-sided diverticula. Tubular adenoma, no high-grade dysplasia.  . Colonoscopy  06/16/2009    NWG:[id number] hemorrhoids, otherwise normal rectum.Flat polypoid lesion at the hepatic flexure, debulked with piecemeal snare polypectomy.  This area was also tattooed Transverse colon and descending colon  flat adenomatous appearing polyps resected with hot snare cautery as well. Tubulovillous adenomas  . Colonoscopy  December 2010    Dr. Jena Gauss: Flat sessile hepatic flexure polyp status post saline-assisted piecemeal polypectomy. Fragments of adenomatous polyp with high-grade dysplasia.  . Colonoscopy  March 2011    Dr. Jena Gauss: Minimal persisting polypoid tissue at site of previous piecemeal polypectomy status post hot snare polypectomy. Tubular adenoma, no high-grade dysplasia    Family History  Problem Relation Age of Onset  . Lupus Sister   . Other Sister     ? Liver cancer    History   Social History  . Marital Status: Divorced    Spouse Name: N/A    Number of Children: N/A  . Years of Education: N/A   Occupational History  . Not on file.    Social History Main Topics  . Smoking status: Never Smoker   . Smokeless tobacco: Not on file  . Alcohol Use: No  . Drug Use: No  . Sexually Active: Not on file   Other Topics Concern  . Not on file   Social History Narrative  . No narrative on file      ROS:  General: Negative for anorexia, fever, chills, fatigue, weakness. See hpi. Eyes: Negative for vision changes.  ENT: Negative for hoarseness, difficulty swallowing , nasal congestion. CV: Negative for chest pain, angina, palpitations, dyspnea on exertion, peripheral edema.  Respiratory: Negative for dyspnea at rest, dyspnea on exertion, cough, sputum, wheezing.  GI: See history of present illness. GU:  Negative for dysuria, hematuria, urinary incontinence, urinary frequency, nocturnal urination.  MS: Negative for joint pain, low back pain.  Derm: Negative for rash or itching.  Neuro: Negative for weakness, abnormal sensation, seizure, frequent headaches, memory loss, confusion.  Psych: Negative for anxiety, depression, suicidal ideation, hallucinations.  Endo: see hpi. Heme: Negative for bruising or bleeding. Allergy: Negative for rash or hives.    Physical Examination:  BP 145/63  Pulse 74  Temp(Src) 97.6 F (36.4 C) (Oral)  Ht 4\' 9"  (1.448 m)  Wt 125 lb 9.6 oz (56.972 kg)  BMI 27.17 kg/m2   General: Well-nourished, well-developed in no acute distress. Accompanied by family member.  Head: Normocephalic, atraumatic.   Eyes: Conjunctiva pink, no icterus. Mouth: Oropharyngeal mucosa moist and pink , no lesions erythema or exudate. Neck: Supple without thyromegaly, masses, or lymphadenopathy.  Lungs: Clear to auscultation bilaterally.  Heart: Regular rate and rhythm, no murmurs rubs or gallops.  Abdomen: Bowel sounds are normal, nontender, nondistended, no hepatosplenomegaly or masses, no abdominal bruits or    hernia , no rebound or guarding.   Rectal: not performed Extremities: No lower extremity edema. No  clubbing or deformities.  Neuro: Alert and oriented x 4 , grossly normal neurologically.  Skin: Warm and dry, no rash or jaundice.   Psych: Alert and cooperative, normal mood and affect.  Labs: Lab Results  Component Value Date   WBC 13.9* 02/12/2013   HGB 11.8* 02/12/2013   HCT 34.9* 02/12/2013   MCV 87.7 02/12/2013   PLT 307 02/12/2013   Lab Results  Component Value Date   CREATININE 1.19* 02/12/2013   BUN 25* 02/12/2013   NA 135 02/12/2013   K 3.3* 02/12/2013   CL 93* 02/12/2013   CO2 21 02/12/2013   Lab Results  Component Value Date   ALT 10 02/12/2013   AST 17 02/12/2013   ALKPHOS 73 02/12/2013   BILITOT 0.7 02/12/2013   Lab Results  Component  Value Date   LIPASE 75* 02/03/2013      Imaging Studies: US Abdomen Complete  03/04/2013   *RADIOLOGY REPORT*  Clinical Data:  Abdominal pain  COMPLETE ABDOMINAL ULTRASOUND  Comparison:  CT scan 02/03/2013  Findings:  Gallbladder:  No gallstones, or pericholecystic fluid. Borderline thickening of the gallbladder wall up to 3 mm without sonographic Murphy's sign.  Common bile duct:  Measures 5.7 mm in diameter within normal limits.  Liver:  No focal lesion identified.  Within normal limits in parenchymal echogenicity.  IVC:  Appears normal.  Pancreas:  No focal abnormality seen. Main pancreatic duct measures 1.7 mm in diameter.  Spleen:  Measures 5.4 cm in length.  Normal echogenicity.  Right Kidney:  Measures 10 cm in length.  No hydronephrosis or diagnostic renal calculus  Left Kidney:  Measures 10 cm in length.  No hydronephrosis or diagnostic renal calculus  Abdominal aorta:  No aneurysm identified. Up to 2 cm in diameter.  IMPRESSION:  1.  No gallstones are noted within gallbladder.  Borderline thickening of gallbladder wall without sonographic Murphy's sign. 2.  Normal CBD. 3.  No hydronephrosis or diagnostic calculus.   Original Report Authenticated By: Natasha Mead, M.D.   RADIOLOGY REPORT*  Clinical Data: Abdominal pain. Contrast allergy.   CT ABDOMEN AND PELVIS WITHOUT CONTRAST  Technique: Multidetector CT imaging of the abdomen and pelvis was  performed following the standard protocol without intravenous  contrast.  Comparison: 12/30/2012  Findings: Lack of intravenous contrast limits the study.  Gallbladder is decompressed. Liver, spleen, pancreas, kidneys are  within normal limits. Prominence of the adrenal glands with  nodularity bilaterally is stable.  Normal appendix.  No free fluid. No obvious abnormal adenopathy.  Mild atherosclerotic changes of the aorta and iliac vessels.  Bladder is within normal limits. Uterus absent. Adnexa are within  normal limits.  Advanced degenerative disc disease at L4-5. Advanced facet  arthropathy at L5 S1. No acute bony deformity.  IMPRESSION:  No acute intra-abdominal pathology.  Original Report Authenticated By: Jolaine Click, M.D.

## 2013-04-03 ENCOUNTER — Encounter: Payer: Self-pay | Admitting: Gastroenterology

## 2013-04-03 DIAGNOSIS — D649 Anemia, unspecified: Secondary | ICD-10-CM | POA: Insufficient documentation

## 2013-04-03 NOTE — Assessment & Plan Note (Signed)
Patient states her pain has resolved. Denies abdominal pain for several weeks. H/O gradual weight loss but weight stable last few months. Advised her to keep diary of her symptoms and call if recurrent pain. At that time would offer HIDA vs EGD as appropriate.   Will recheck CBC in 1 month. TCS due in 01/2016.

## 2013-04-06 NOTE — Progress Notes (Signed)
Cc PCP 

## 2013-05-11 ENCOUNTER — Other Ambulatory Visit (HOSPITAL_COMMUNITY): Payer: Self-pay | Admitting: Internal Medicine

## 2013-05-11 DIAGNOSIS — Z09 Encounter for follow-up examination after completed treatment for conditions other than malignant neoplasm: Secondary | ICD-10-CM

## 2013-05-11 LAB — CBC WITH DIFFERENTIAL/PLATELET
Basophils Absolute: 0 10*3/uL (ref 0.0–0.1)
MCH: 28.6 pg (ref 26.0–34.0)
MCHC: 32.9 g/dL (ref 30.0–36.0)
MCV: 86.7 fL (ref 78.0–100.0)
Monocytes Absolute: 0.4 10*3/uL (ref 0.1–1.0)
Monocytes Relative: 7 % (ref 3–12)
Neutrophils Relative %: 56 % (ref 43–77)
Platelets: 315 10*3/uL (ref 150–400)
RDW: 13.1 % (ref 11.5–15.5)

## 2013-05-21 NOTE — Progress Notes (Signed)
Quick Note:  Please let patient know her CBC was NORMAL! ______

## 2013-05-30 ENCOUNTER — Emergency Department (HOSPITAL_COMMUNITY): Payer: Medicare Other

## 2013-05-30 ENCOUNTER — Emergency Department (HOSPITAL_COMMUNITY)
Admission: EM | Admit: 2013-05-30 | Discharge: 2013-05-30 | Disposition: A | Discharge status: Home / Self Care | Payer: Medicare Other | Attending: Emergency Medicine | Admitting: Emergency Medicine

## 2013-05-30 ENCOUNTER — Encounter (HOSPITAL_COMMUNITY): Payer: Self-pay | Admitting: *Deleted

## 2013-05-30 DIAGNOSIS — S8000XA Contusion of unspecified knee, initial encounter: Secondary | ICD-10-CM | POA: Insufficient documentation

## 2013-05-30 DIAGNOSIS — E119 Type 2 diabetes mellitus without complications: Secondary | ICD-10-CM | POA: Insufficient documentation

## 2013-05-30 DIAGNOSIS — D509 Iron deficiency anemia, unspecified: Secondary | ICD-10-CM | POA: Insufficient documentation

## 2013-05-30 DIAGNOSIS — I10 Essential (primary) hypertension: Secondary | ICD-10-CM | POA: Insufficient documentation

## 2013-05-30 DIAGNOSIS — Y929 Unspecified place or not applicable: Secondary | ICD-10-CM | POA: Insufficient documentation

## 2013-05-30 DIAGNOSIS — Z860100 Personal history of colon polyps, unspecified: Secondary | ICD-10-CM | POA: Insufficient documentation

## 2013-05-30 DIAGNOSIS — Z7982 Long term (current) use of aspirin: Secondary | ICD-10-CM | POA: Insufficient documentation

## 2013-05-30 DIAGNOSIS — Y9301 Activity, walking, marching and hiking: Secondary | ICD-10-CM | POA: Insufficient documentation

## 2013-05-30 DIAGNOSIS — Z8719 Personal history of other diseases of the digestive system: Secondary | ICD-10-CM | POA: Insufficient documentation

## 2013-05-30 DIAGNOSIS — E785 Hyperlipidemia, unspecified: Secondary | ICD-10-CM | POA: Insufficient documentation

## 2013-05-30 DIAGNOSIS — Z79899 Other long term (current) drug therapy: Secondary | ICD-10-CM | POA: Insufficient documentation

## 2013-05-30 DIAGNOSIS — W108XXA Fall (on) (from) other stairs and steps, initial encounter: Secondary | ICD-10-CM | POA: Insufficient documentation

## 2013-05-30 DIAGNOSIS — S8002XA Contusion of left knee, initial encounter: Secondary | ICD-10-CM

## 2013-05-30 MED ORDER — HYDROCODONE-ACETAMINOPHEN 5-325 MG PO TABS: 1.0000 | ORAL_TABLET | ORAL | Status: DC | PRN

## 2013-05-30 NOTE — ED Notes (Signed)
Pt states that she missed a step last night while going down basement stairs, states that the fell down the rest of the steps ?3-4 steps, c/o left knee pain, pt has abrasion to left knee area. Cms intact distal, pt alert, denies hitting her head, denies any pain to head, neck or back area

## 2013-05-30 NOTE — ED Provider Notes (Signed)
CSN: [id number]     Arrival date & time 05/30/13  0903 History  This chart was scribed for Taylor Cooper III, MD by Bennett Scrape, ED Scribe. This patient was seen in room APA17/AP[facility]17 and the patient's care was started at 9:50 AM.   First MD Initiated Contact with Patient 05/30/13 [phone]     Chief Complaint  Patient presents with  . Fall  . Leg Pain    The history is provided by the patient. No language interpreter was used.    HPI Comments: Taylor Andrews is a 79 y.o. female who presents to the Emergency Department complaining of a fall down basement steps last night. Pt reports that she walking down her basement steps when she missed one and fell down 3 to 4 steps. She c/o associated left knee pain but denies any bleeding wounds or head trauma. She also denies neck pain, back pain and abdominal pain.   Past Medical History  Diagnosis Date  . Diabetes mellitus   . Hypertension   . Hyperlipidemia   . Fatty liver     on [place] normal LFT'S  . Iron deficiency     Chronic current parameter normal; 4/09 iron was 12 sat 5%  . GERD (gastroesophageal reflux disease)   . Adenomatous colon polyp     carcinoma in situ   Past Surgical History  Procedure Laterality Date  . Colonoscopy  8/05    internal hemorrhoids,polyp bengin  . Esophagogastroduodenoscopy  6/05    tiny antral erosin with some deformity of pyloric channel  . Vesicovaginal fistula closure w/ tah    . Ankle surgery      Left ;pins  . Colonoscopy  01/12/2011    MVH:QIONGE rectum/ 5-mm polyp at the splenic flexure s/p polypectomy, tattooing, and scarred hepatic flexure, absolutely no evidence of any residual polyp tissue or neoplasm/Scattered left-sided diverticula. Tubular adenoma, no high-grade dysplasia.  . Colonoscopy  06/16/2009    XBM:[id number] hemorrhoids, otherwise normal rectum.Flat polypoid lesion at the hepatic flexure, debulked with piecemeal snare polypectomy.  This area was also tattooed Transverse colon and  descending colon  flat adenomatous appearing polyps resected with hot snare cautery as well. Tubulovillous adenomas  . Colonoscopy  December 2010    Dr. Jena Gauss: Flat sessile hepatic flexure polyp status post saline-assisted piecemeal polypectomy. Fragments of adenomatous polyp with high-grade dysplasia.  . Colonoscopy  March 2011    Dr. Jena Gauss: Minimal persisting polypoid tissue at site of previous piecemeal polypectomy status post hot snare polypectomy. Tubular adenoma, no high-grade dysplasia   Family History  Problem Relation Age of Onset  . Lupus Sister   . Other Sister     ? Liver cancer   History  Substance Use Topics  . Smoking status: Never Smoker   . Smokeless tobacco: Not on file  . Alcohol Use: No   No OB history provided.   Review of Systems  Cardiovascular: Negative for chest pain.  Gastrointestinal: Negative for abdominal pain.  Musculoskeletal: Positive for arthralgias (left knee). Negative for back pain.  Skin: Positive for wound (left knee).  All other systems reviewed and are negative.    Allergies  Iohexol and Milk-related compounds  Home Medications   Current Outpatient Rx  Name  Route  Sig  Dispense  Refill  . amLODipine (NORVASC) 10 MG tablet   Oral   Take 10 mg by mouth daily.           Marland Kitchen aspirin EC 81 MG tablet   Oral  Take 81 mg by mouth every morning.          . colesevelam (WELCHOL) 625 MG tablet   Oral   Take 625 mg by mouth daily.         Marland Kitchen dicyclomine (BENTYL) 20 MG tablet      Take one every 6 hours for abdominal cramps   20 tablet   0   . donepezil (ARICEPT) 5 MG tablet   Oral   Take 5 mg by mouth at bedtime.          . ferrous gluconate (FERGON) 325 MG tablet   Oral   Take 325 mg by mouth daily with breakfast.          . hydrochlorothiazide 25 MG tablet   Oral   Take 12.5 mg by mouth every morning.          . metFORMIN (GLUCOPHAGE-XR) 500 MG 24 hr tablet   Oral   Take 500 mg by mouth every morning.           . Multiple Vitamin (MULTIVITAMIN) capsule   Oral   Take 1 capsule by mouth every morning.          . potassium chloride SA (K-DUR,KLOR-CON) 20 MEQ tablet   Oral   Take 20 mEq by mouth every morning.          . vitamin C (ASCORBIC ACID) 500 MG tablet   Oral   Take 500 mg by mouth every morning.           Triage Vitals: BP 165/59  Pulse 59  Temp(Src) 97.7 F (36.5 C) (Oral)  Resp 18  Ht 4\' 9"  (1.448 m)  SpO2 99%  Physical Exam  Nursing note and vitals reviewed. Constitutional: She is oriented to person, place, and time. She appears well-developed and well-nourished. No distress.  HENT:  Head: Normocephalic and atraumatic.  Eyes: EOM are normal.  Neck: Neck supple. No tracheal deviation present.  Cardiovascular: Normal rate and regular rhythm.   Pulmonary/Chest: Effort normal and breath sounds normal. No respiratory distress. She exhibits no tenderness (no apparent tenderness ).  Abdominal: Soft. There is no tenderness (no apparent tenderness ).  Musculoskeletal: Normal range of motion.  Abrasion over left patella with mild swelling, no palpable bony deformity, entire back in non-tender  Neurological: She is alert and oriented to person, place, and time.  Skin: Skin is warm and dry.  Psychiatric: She has a normal mood and affect. Her behavior is normal.    ED Course   Procedures (including critical care time)  DIAGNOSTIC STUDIES: Oxygen Saturation is 99% on room air, normal by my interpretation.    COORDINATION OF CARE: 9:52 AM-Discussed treatment plan which includes left knee x-ray with pt at bedside and pt agreed to plan.   Dg Knee Complete 4 Views Left  05/30/2013   *RADIOLOGY REPORT*  Clinical Data:  pain post trauma  LEFT KNEE - COMPLETE 4+ VIEW  Comparison: None.  Findings: Frontal, lateral, and bilateral oblique views were obtained.  There is no fracture, dislocation, or effusion.  There is marked narrowing medially.  There is milder narrowing  laterally. There is spurring in all compartments.  No erosive change.  IMPRESSION: Osteoarthritic change, most marked medially.  No fracture or effusion.   Original Report Authenticated By: Bretta Bang, M.D.   X-ray showed no fracture.  Advised knee sleeve, ice, rest.   1. Contusion of left knee, initial encounter    I personally performed the  services described in this documentation, which was scribed in my presence. The recorded information has been reviewed and is accurate.  Osvaldo Human, MD      Taylor Cooper III, MD 05/30/13 [phone]

## 2013-05-30 NOTE — ED Notes (Signed)
The patient states that she was walking down her steps yesterday and missed a step, causing her to fall and strike her left knee, states that she did not hit her head, no other complaints offered at present, no acute distress noted.

## 2013-06-04 ENCOUNTER — Ambulatory Visit (HOSPITAL_COMMUNITY)
Admission: RE | Admit: 2013-06-04 | Discharge: 2013-06-04 | Disposition: A | Discharge status: Home / Self Care | Payer: Medicare Other | Source: Ambulatory Visit | Attending: Internal Medicine | Admitting: Internal Medicine

## 2013-06-04 DIAGNOSIS — Z1231 Encounter for screening mammogram for malignant neoplasm of breast: Secondary | ICD-10-CM | POA: Insufficient documentation

## 2013-06-04 DIAGNOSIS — Z09 Encounter for follow-up examination after completed treatment for conditions other than malignant neoplasm: Secondary | ICD-10-CM

## 2013-06-05 ENCOUNTER — Emergency Department (HOSPITAL_COMMUNITY)
Admission: EM | Admit: 2013-06-05 | Discharge: 2013-06-05 | Disposition: A | Discharge status: Home / Self Care | Payer: Medicare Other | Attending: Emergency Medicine | Admitting: Emergency Medicine

## 2013-06-05 ENCOUNTER — Encounter (HOSPITAL_COMMUNITY): Payer: Self-pay | Admitting: *Deleted

## 2013-06-05 DIAGNOSIS — L299 Pruritus, unspecified: Secondary | ICD-10-CM | POA: Insufficient documentation

## 2013-06-05 DIAGNOSIS — E785 Hyperlipidemia, unspecified: Secondary | ICD-10-CM | POA: Insufficient documentation

## 2013-06-05 DIAGNOSIS — Z8719 Personal history of other diseases of the digestive system: Secondary | ICD-10-CM | POA: Insufficient documentation

## 2013-06-05 DIAGNOSIS — Z860100 Personal history of colon polyps, unspecified: Secondary | ICD-10-CM | POA: Insufficient documentation

## 2013-06-05 DIAGNOSIS — E119 Type 2 diabetes mellitus without complications: Secondary | ICD-10-CM | POA: Insufficient documentation

## 2013-06-05 DIAGNOSIS — I10 Essential (primary) hypertension: Secondary | ICD-10-CM | POA: Insufficient documentation

## 2013-06-05 DIAGNOSIS — D509 Iron deficiency anemia, unspecified: Secondary | ICD-10-CM | POA: Insufficient documentation

## 2013-06-05 DIAGNOSIS — Z8639 Personal history of other endocrine, nutritional and metabolic disease: Secondary | ICD-10-CM | POA: Insufficient documentation

## 2013-06-05 DIAGNOSIS — Z862 Personal history of diseases of the blood and blood-forming organs and certain disorders involving the immune mechanism: Secondary | ICD-10-CM | POA: Insufficient documentation

## 2013-06-05 NOTE — ED Provider Notes (Signed)
CSN: [id number]     Arrival date & time 06/05/13  1536 History     First MD Initiated Contact with Patient 06/05/13 1603     Chief Complaint  Patient presents with  . leg itching    (Consider location/radiation/quality/duration/timing/severity/associated sxs/prior Treatment) HPI Comments: Taylor Andrews is a 79 y.o. Female presenting with sudden onset of itching of her left leg from her mid thigh through her mid calf this afternoon which has since resolved.  She was seen here last week for a left knee injury after falling down her steps and she was left with a knee abrasion which is appearing to be healing well and for which she has no ongoing complaint.  She was wearing a knee sleeve however,  Which she removed this morning.  She denies fevers, chills, rash and her symptoms have completely resolved.     The history is provided by the patient.    Past Medical History  Diagnosis Date  . Diabetes mellitus   . Hypertension   . Hyperlipidemia   . Fatty liver     on [place] normal LFT'S  . Iron deficiency     Chronic current parameter normal; 4/09 iron was 12 sat 5%  . GERD (gastroesophageal reflux disease)   . Adenomatous colon polyp     carcinoma in situ   Past Surgical History  Procedure Laterality Date  . Colonoscopy  8/05    internal hemorrhoids,polyp bengin  . Esophagogastroduodenoscopy  6/05    tiny antral erosin with some deformity of pyloric channel  . Vesicovaginal fistula closure w/ tah    . Ankle surgery      Left ;pins  . Colonoscopy  01/12/2011    HYQ:MVHQIO rectum/ 5-mm polyp at the splenic flexure s/p polypectomy, tattooing, and scarred hepatic flexure, absolutely no evidence of any residual polyp tissue or neoplasm/Scattered left-sided diverticula. Tubular adenoma, no high-grade dysplasia.  . Colonoscopy  06/16/2009    NGE:XBMWUXLK hemorrhoids, otherwise normal rectum.Flat polypoid lesion at the hepatic flexure, debulked with piecemeal snare polypectomy.  This area was  also tattooed Transverse colon and descending colon  flat adenomatous appearing polyps resected with hot snare cautery as well. Tubulovillous adenomas  . Colonoscopy  December 2010    Dr. Jena Gauss: Flat sessile hepatic flexure polyp status post saline-assisted piecemeal polypectomy. Fragments of adenomatous polyp with high-grade dysplasia.  . Colonoscopy  March 2011    Dr. Jena Gauss: Minimal persisting polypoid tissue at site of previous piecemeal polypectomy status post hot snare polypectomy. Tubular adenoma, no high-grade dysplasia   Family History  Problem Relation Age of Onset  . Lupus Sister   . Other Sister     ? Liver cancer   History  Substance Use Topics  . Smoking status: Never Smoker   . Smokeless tobacco: Not on file  . Alcohol Use: No   OB History   Grav Para Term Preterm Abortions TAB SAB Ect Mult Living                 Review of Systems  Constitutional: Negative for fever and chills.  HENT: Negative for facial swelling.   Respiratory: Negative for shortness of breath and wheezing.   Skin: Negative for rash.  Neurological: Negative for numbness.    Allergies  Iohexol and Milk-related compounds  Home Medications   Current Outpatient Rx  Name  Route  Sig  Dispense  Refill  . amLODipine (NORVASC) 10 MG tablet   Oral   Take 10 mg by mouth  daily.           . aspirin EC 81 MG tablet   Oral   Take 81 mg by mouth every morning.          . colesevelam (WELCHOL) 625 MG tablet   Oral   Take 625 mg by mouth daily.         Marland Kitchen dicyclomine (BENTYL) 20 MG tablet      Take one every 6 hours for abdominal cramps   20 tablet   0   . donepezil (ARICEPT) 5 MG tablet   Oral   Take 5 mg by mouth at bedtime.          . ferrous gluconate (FERGON) 325 MG tablet   Oral   Take 325 mg by mouth daily with breakfast.          . hydrochlorothiazide 25 MG tablet   Oral   Take 12.5 mg by mouth every morning.          Marland Kitchen HYDROcodone-acetaminophen (NORCO/VICODIN)  5-325 MG per tablet   Oral   Take 1 tablet by mouth every 4 (four) hours as needed for pain.   20 tablet   0   . metFORMIN (GLUCOPHAGE-XR) 500 MG 24 hr tablet   Oral   Take 500 mg by mouth every morning.          . Multiple Vitamin (MULTIVITAMIN) capsule   Oral   Take 1 capsule by mouth every morning.          . potassium chloride SA (K-DUR,KLOR-CON) 20 MEQ tablet   Oral   Take 20 mEq by mouth every morning.          . vitamin C (ASCORBIC ACID) 500 MG tablet   Oral   Take 500 mg by mouth every morning.           BP 126/100  Pulse 75  Temp(Src) 97.5 F (36.4 C) (Oral)  Resp 16  Ht 4\' 9"  (1.448 m)  Wt 125 lb 9.6 oz (56.972 kg)  BMI 27.17 kg/m2  SpO2 100% Physical Exam  Nursing note and vitals reviewed. Constitutional: She appears well-developed and well-nourished.  HENT:  Head: Normocephalic and atraumatic.  Eyes: Conjunctivae are normal.  Neck: Normal range of motion.  Cardiovascular: Normal rate, regular rhythm, normal heart sounds and intact distal pulses.   Pulmonary/Chest: Effort normal and breath sounds normal. She has no wheezes.  Abdominal: Soft. Bowel sounds are normal. There is no tenderness.  Musculoskeletal: Normal range of motion.  Neurological: She is alert.  Skin: Skin is warm and dry. No rash noted. No erythema.  Psychiatric: She has a normal mood and affect.    ED Course   Procedures (including critical care time)  Labs Reviewed - No data to display Mm Digital Screening  06/05/2013   *RADIOLOGY REPORT*  Clinical Data: Screening.  DIGITAL SCREENING BILATERAL MAMMOGRAM WITH CAD  Comparison: Previous exam(s).  FINDINGS:  ACR Breast Density Category a:  The breast tissue is almost entirely fatty.  There are no findings suspicious for malignancy.  Images were processed with CAD.  IMPRESSION: No mammographic evidence of malignancy.  A result letter of this screening mammogram will be mailed directly to the patient.  RECOMMENDATION: Screening  mammogram in one year. (Code:SM-B-01Y)  BI-RADS CATEGORY 1:  Negative.   Original Report Authenticated By: Beckie Salts, M.D.   1. Itching     MDM  Patient with normal exam.  No rash,  Skin normal appearance,  Left medial knee abrasion well healing, no swelling, no erythema.  Advised recheck for any recurrence or worsening,  Rash development, etc.  Advised benadryl cream or spray for itch relief,  Cool compresses if sx returns.   Burgess Amor, PA-C 06/05/13 1641

## 2013-06-05 NOTE — ED Notes (Signed)
Sudden onset of L leg itching.  L knee very swollen w/scabbed area from falling down basement steps last week.

## 2013-06-06 NOTE — ED Provider Notes (Signed)
Medical screening examination/treatment/procedure(s) were performed by non-physician practitioner and as supervising physician I was immediately available for consultation/collaboration.  Aitanna Haubner, MD 06/06/13 0002 

## 2013-07-14 ENCOUNTER — Emergency Department (HOSPITAL_COMMUNITY): Payer: Medicare Other

## 2013-07-14 ENCOUNTER — Encounter (HOSPITAL_COMMUNITY): Payer: Self-pay | Admitting: *Deleted

## 2013-07-14 ENCOUNTER — Emergency Department (HOSPITAL_COMMUNITY)
Admission: EM | Admit: 2013-07-14 | Discharge: 2013-07-14 | Disposition: A | Discharge status: Home / Self Care | Payer: Medicare Other | Attending: Emergency Medicine | Admitting: Emergency Medicine

## 2013-07-14 DIAGNOSIS — Z7982 Long term (current) use of aspirin: Secondary | ICD-10-CM | POA: Insufficient documentation

## 2013-07-14 DIAGNOSIS — R109 Unspecified abdominal pain: Secondary | ICD-10-CM

## 2013-07-14 DIAGNOSIS — E119 Type 2 diabetes mellitus without complications: Secondary | ICD-10-CM | POA: Insufficient documentation

## 2013-07-14 DIAGNOSIS — R1033 Periumbilical pain: Secondary | ICD-10-CM | POA: Insufficient documentation

## 2013-07-14 DIAGNOSIS — Z860100 Personal history of colon polyps, unspecified: Secondary | ICD-10-CM | POA: Insufficient documentation

## 2013-07-14 DIAGNOSIS — K5900 Constipation, unspecified: Secondary | ICD-10-CM | POA: Insufficient documentation

## 2013-07-14 DIAGNOSIS — I10 Essential (primary) hypertension: Secondary | ICD-10-CM | POA: Insufficient documentation

## 2013-07-14 DIAGNOSIS — D509 Iron deficiency anemia, unspecified: Secondary | ICD-10-CM | POA: Insufficient documentation

## 2013-07-14 DIAGNOSIS — R110 Nausea: Secondary | ICD-10-CM | POA: Insufficient documentation

## 2013-07-14 DIAGNOSIS — E785 Hyperlipidemia, unspecified: Secondary | ICD-10-CM | POA: Insufficient documentation

## 2013-07-14 LAB — URINALYSIS, ROUTINE W REFLEX MICROSCOPIC
Glucose, UA: NEGATIVE mg/dL
Leukocytes, UA: NEGATIVE
Nitrite: NEGATIVE
Protein, ur: NEGATIVE mg/dL
Urobilinogen, UA: 0.2 mg/dL (ref 0.0–1.0)

## 2013-07-14 LAB — COMPREHENSIVE METABOLIC PANEL
AST: 16 U/L (ref 0–37)
BUN: 14 mg/dL (ref 6–23)
CO2: 33 mEq/L — ABNORMAL HIGH (ref 19–32)
Calcium: 10.8 mg/dL — ABNORMAL HIGH (ref 8.4–10.5)
Creatinine, Ser: 0.79 mg/dL (ref 0.50–1.10)
GFR calc Af Amer: >90 mL/min (ref >90)
GFR calc non Af Amer: 79 mL/min — ABNORMAL LOW (ref >90)

## 2013-07-14 LAB — CBC WITH DIFFERENTIAL/PLATELET
Basophils Absolute: 0 10*3/uL (ref 0.0–0.1)
Eosinophils Relative: 1 % (ref 0–5)
HCT: 45 % (ref 36.0–46.0)
Lymphocytes Relative: 38 % (ref 12–46)
MCHC: 33.1 g/dL (ref 30.0–36.0)
MCV: 88.4 fL (ref 78.0–100.0)
Monocytes Absolute: 0.6 10*3/uL (ref 0.1–1.0)
RDW: 14.4 % (ref 11.5–15.5)

## 2013-07-14 LAB — LIPASE, BLOOD: Lipase: 57 U/L (ref 11–59)

## 2013-07-14 MED ORDER — DICYCLOMINE HCL 20 MG PO TABS: ORAL_TABLET | ORAL | Status: DC

## 2013-07-14 NOTE — ED Notes (Signed)
Pt c/o abd pain that started yesterday, denies any n/v/d, last bowel movement was yesterday, but states that she has been constipated "some" lately.

## 2013-07-14 NOTE — ED Provider Notes (Signed)
CSN: [id number]     Arrival date & time 07/14/13  1101 History  This chart was scribed for Benny Lennert, MD by Caryn Bee, ED Scribe. This patient was seen in room APA06/APA06 and the patient's care was started 12:20 PM.    Chief Complaint  Patient presents with  . Abdominal Pain     Patient is a 79 y.o. female presenting with abdominal pain. The history is provided by the patient. No language interpreter was used.  Abdominal Pain Pain location:  Periumbilical Pain radiates to:  Does not radiate Pain severity:  Moderate Onset quality:  Gradual Timing:  Constant Progression:  Worsening Chronicity:  Recurrent Relieved by:  None tried Worsened by:  Nothing tried Ineffective treatments:  None tried Associated symptoms: constipation and nausea   Associated symptoms: no chest pain, no chills, no cough, no diarrhea, no fatigue, no fever, no hematuria and no vomiting    HPI Comments: Taylor Andrews is a 79 y.o. female who presents to the Emergency Department complaining of constant, moderate, periumbilical abdominal pain that began a few weeks ago. She reports associated nausea. Pt has seen her PCP about similar symptoms. Pt has h/o colonoscopy and esophagogastroduodenoscopy. Pt denies abdominal cramping, fever, chills, diarrhea, emesis or any other symptoms.   Past Medical History  Diagnosis Date  . Diabetes mellitus   . Hypertension   . Hyperlipidemia   . Fatty liver     on [place] normal LFT'S  . Iron deficiency     Chronic current parameter normal; 4/09 iron was 12 sat 5%  . GERD (gastroesophageal reflux disease)   . Adenomatous colon polyp     carcinoma in situ   Past Surgical History  Procedure Laterality Date  . Colonoscopy  8/05    internal hemorrhoids,polyp bengin  . Esophagogastroduodenoscopy  6/05    tiny antral erosin with some deformity of pyloric channel  . Vesicovaginal fistula closure w/ tah    . Ankle surgery      Left ;pins  . Colonoscopy  01/12/2011     WUJ:WJXBJY rectum/ 5-mm polyp at the splenic flexure s/p polypectomy, tattooing, and scarred hepatic flexure, absolutely no evidence of any residual polyp tissue or neoplasm/Scattered left-sided diverticula. Tubular adenoma, no high-grade dysplasia.  . Colonoscopy  06/16/2009    NWG:[id number] hemorrhoids, otherwise normal rectum.Flat polypoid lesion at the hepatic flexure, debulked with piecemeal snare polypectomy.  This area was also tattooed Transverse colon and descending colon  flat adenomatous appearing polyps resected with hot snare cautery as well. Tubulovillous adenomas  . Colonoscopy  December 2010    Dr. Jena Gauss: Flat sessile hepatic flexure polyp status post saline-assisted piecemeal polypectomy. Fragments of adenomatous polyp with high-grade dysplasia.  . Colonoscopy  March 2011    Dr. Jena Gauss: Minimal persisting polypoid tissue at site of previous piecemeal polypectomy status post hot snare polypectomy. Tubular adenoma, no high-grade dysplasia   Family History  Problem Relation Age of Onset  . Lupus Sister   . Other Sister     ? Liver cancer   History  Substance Use Topics  . Smoking status: Never Smoker   . Smokeless tobacco: Not on file  . Alcohol Use: No   OB History   Grav Para Term Preterm Abortions TAB SAB Ect Mult Living                 Review of Systems  Constitutional: Negative for fever, chills, appetite change and fatigue.  HENT: Negative for congestion, sinus pressure and ear  discharge.   Eyes: Negative for discharge.  Respiratory: Negative for cough.   Cardiovascular: Negative for chest pain.  Gastrointestinal: Positive for nausea, abdominal pain and constipation. Negative for vomiting and diarrhea.  Genitourinary: Negative for frequency and hematuria.  Musculoskeletal: Negative for back pain.  Skin: Negative for rash.  Neurological: Negative for seizures and headaches.  Psychiatric/Behavioral: Negative for hallucinations.  All other systems reviewed and are  negative.    Allergies  Iohexol and Milk-related compounds  Home Medications   Current Outpatient Rx  Name  Route  Sig  Dispense  Refill  . amLODipine (NORVASC) 10 MG tablet   Oral   Take 10 mg by mouth daily.           Marland Kitchen aspirin EC 81 MG tablet   Oral   Take 81 mg by mouth every morning.          . colesevelam (WELCHOL) 625 MG tablet   Oral   Take 625 mg by mouth daily.         Marland Kitchen dicyclomine (BENTYL) 20 MG tablet   Oral   Take 20 mg by mouth daily.         Marland Kitchen donepezil (ARICEPT) 5 MG tablet   Oral   Take 5 mg by mouth at bedtime.          . ferrous gluconate (FERGON) 325 MG tablet   Oral   Take 325 mg by mouth daily with breakfast.          . hydrochlorothiazide 25 MG tablet   Oral   Take 12.5 mg by mouth every morning.          . metFORMIN (GLUCOPHAGE-XR) 500 MG 24 hr tablet   Oral   Take 500 mg by mouth every morning.          . Multiple Vitamin (MULTIVITAMIN WITH MINERALS) TABS tablet   Oral   Take 1 tablet by mouth daily.         . potassium chloride SA (K-DUR,KLOR-CON) 20 MEQ tablet   Oral   Take 20 mEq by mouth every morning.          . vitamin C (ASCORBIC ACID) 500 MG tablet   Oral   Take 500 mg by mouth every morning.          Marland Kitchen HYDROcodone-acetaminophen (NORCO/VICODIN) 5-325 MG per tablet   Oral   Take 1 tablet by mouth every 4 (four) hours as needed for pain.   20 tablet   0    Triage Vitals: BP 152/63  Pulse 90  Temp(Src) 98.3 F (36.8 C)  Resp 18  Ht 4' 9.5" (1.461 m)  Wt 119 lb (53.978 kg)  BMI 25.29 kg/m2  SpO2 100%  Physical Exam  Nursing note and vitals reviewed. Constitutional: She is oriented to person, place, and time. She appears well-developed.  HENT:  Head: Normocephalic.  Eyes: Conjunctivae and EOM are normal. No scleral icterus.  Neck: Neck supple. No thyromegaly present.  Cardiovascular: Normal rate and regular rhythm.  Exam reveals no gallop and no friction rub.   No murmur  heard. Pulmonary/Chest: No stridor. She has no wheezes. She has no rales. She exhibits no tenderness.  Abdominal: She exhibits no distension. There is no tenderness. There is no rebound.  Musculoskeletal: Normal range of motion. She exhibits no edema.  Lymphadenopathy:    She has no cervical adenopathy.  Neurological: She is oriented to person, place, and time. Coordination normal.  Skin: No rash  noted. No erythema.  Psychiatric: She has a normal mood and affect. Her behavior is normal.    ED Course  Procedures (including critical care time) DIAGNOSTIC STUDIES: Oxygen Saturation is 100% on room air, normal by my interpretation.    COORDINATION OF CARE: 12:26 PM-Discussed plan for diagnostic lab work and radiology with pt at bedside and pt agreed to plan.   Labs Review Labs Reviewed  CBC WITH DIFFERENTIAL  COMPREHENSIVE METABOLIC PANEL  LIPASE, BLOOD  URINALYSIS, ROUTINE W REFLEX MICROSCOPIC   Imaging Review No results found.  MDM  No diagnosis found.    The chart was scribed for me under my direct supervision.  I personally performed the history, physical, and medical decision making and all procedures in the evaluation of this patient.Benny Lennert, MD 07/14/13 [phone]

## 2013-12-30 ENCOUNTER — Ambulatory Visit (INDEPENDENT_AMBULATORY_CARE_PROVIDER_SITE_OTHER): Payer: Medicare Other | Admitting: Gastroenterology

## 2013-12-30 ENCOUNTER — Encounter: Payer: Self-pay | Admitting: Gastroenterology

## 2013-12-30 VITALS — BP 135/78 | HR 78 | Temp 97.5°F | Ht <= 58 in | Wt 123.2 lb

## 2013-12-30 DIAGNOSIS — R1030 Lower abdominal pain, unspecified: Secondary | ICD-10-CM

## 2013-12-30 DIAGNOSIS — Z860101 Personal history of adenomatous and serrated colon polyps: Secondary | ICD-10-CM | POA: Insufficient documentation

## 2013-12-30 DIAGNOSIS — Z86010 Personal history of colonic polyps: Secondary | ICD-10-CM

## 2013-12-30 DIAGNOSIS — D649 Anemia, unspecified: Secondary | ICD-10-CM

## 2013-12-30 DIAGNOSIS — R109 Unspecified abdominal pain: Secondary | ICD-10-CM

## 2013-12-30 LAB — CBC WITH DIFFERENTIAL/PLATELET
Basophils Absolute: 0 10*3/uL (ref 0.0–0.1)
Basophils Relative: 0 % (ref 0–1)
EOS ABS: 0.1 10*3/uL (ref 0.0–0.7)
EOS PCT: 1 % (ref 0–5)
HEMATOCRIT: 42.2 % (ref 36.0–46.0)
HEMOGLOBIN: 14.1 g/dL (ref 12.0–15.0)
LYMPHS ABS: 2.6 10*3/uL (ref 0.7–4.0)
Lymphocytes Relative: 42 % (ref 12–46)
MCH: 29.4 pg (ref 26.0–34.0)
MCHC: 33.4 g/dL (ref 30.0–36.0)
MCV: 88.1 fL (ref 78.0–100.0)
MONO ABS: 0.4 10*3/uL (ref 0.1–1.0)
MONOS PCT: 6 % (ref 3–12)
Neutro Abs: 3.1 10*3/uL (ref 1.7–7.7)
Neutrophils Relative %: 51 % (ref 43–77)
Platelets: 365 10*3/uL (ref 150–400)
RBC: 4.79 MIL/uL (ref 3.87–5.11)
RDW: 13.8 % (ref 11.5–15.5)
WBC: 6.1 10*3/uL (ref 4.0–10.5)

## 2013-12-30 NOTE — Progress Notes (Signed)
Primary Care Physician: Glo Herring., MD  Primary Gastroenterologist:  Garfield Cornea, MD   Chief Complaint  Patient presents with  . Abdominal Pain    HPI: Taylor Andrews is a 80 y.o. female here for further evaluation of abdominal pain. She was last seen in May of 2014. History of advanced colon polyps with high-grade dysplasia. Her last colonoscopy was in March 2012 and her repeat colonoscopy is due in March 2017.  Intermittent abdominal pain for several months. Patient continues to leave alone. Family reports she has complained up lower abdominal pain fairly regularly over the past few months. Patient states her pain is like a ache. Not associated with BMs or meals. Lays down when he occurs or takes Pepto. Seems to help. Family gives her bentyl when she complains of abd pain and it also seems to help. BM mostly daily. No constipation. No melena, brbpr. Denies nausea, vomiting. No heartburn. Denies dysuria. No weight loss. Family is concerned she may be eating foods that are too old.   April 2014 she had an ultrasound that showed no gallstones. Borderline thickening of the gallbladder wall but no Murphy's sign. Otherwise unremarkable study.  She had 4 CTs of the abdomen and pelvis without contrast from July 2013 to April 2014. No significant findings.  EGD in 2005 as outlined below.  Patient currently not on a PPI. Denies typical GERD. No epigastric pain.  Current Outpatient Prescriptions  Medication Sig Dispense Refill  . amLODipine (NORVASC) 10 MG tablet Take 10 mg by mouth daily.        Marland Kitchen aspirin EC 81 MG tablet Take 81 mg by mouth every morning.       . dicyclomine (BENTYL) 10 MG capsule Take 10 mg by mouth 4 (four) times daily -  before meals and at bedtime.      . donepezil (ARICEPT) 5 MG tablet Take 5 mg by mouth at bedtime.       . hydrochlorothiazide 25 MG tablet Take 12.5 mg by mouth every morning.       . metFORMIN (GLUCOPHAGE-XR) 500 MG 24 hr tablet Take 500 mg by  mouth every morning.       Marland Kitchen NAMENDA 5 MG tablet Take 5 mg by mouth 2 (two) times daily.       . niacin (NIASPAN) 500 MG CR tablet Take 500 mg by mouth at bedtime.        No current facility-administered medications for this visit.    Allergies as of 12/30/2013 - Review Complete 12/30/2013  Allergen Reaction Noted  . Iohexol  03/08/2008  . Milk-related compounds  06/02/2009   Past Medical History  Diagnosis Date  . Diabetes mellitus   . Hypertension   . Hyperlipidemia   . Fatty liver     on [place] normal LFT'S  . Iron deficiency     Chronic current parameter normal; 4/09 iron was 12 sat 5%  . GERD (gastroesophageal reflux disease)   . Adenomatous colon polyp     carcinoma in situ   Past Surgical History  Procedure Laterality Date  . Colonoscopy  8/05    internal hemorrhoids,polyp bengin  . Esophagogastroduodenoscopy  6/05    tiny antral erosin with some deformity of pyloric channel  . Vesicovaginal fistula closure w/ tah    . Ankle surgery      Left ;pins  . Colonoscopy  01/12/2011    ZOX:WRUEAV rectum/ 5-mm polyp at the splenic flexure s/p polypectomy, tattooing, and scarred hepatic flexure,  absolutely no evidence of any residual polyp tissue or neoplasm/Scattered left-sided diverticula. Tubular adenoma, no high-grade dysplasia.Repeat TCS due 01/2016  . Colonoscopy  06/16/2009    ATF:TDDUKGUR hemorrhoids, otherwise normal rectum.Flat polypoid lesion at the hepatic flexure, debulked with piecemeal snare polypectomy.  This area was also tattooed Transverse colon and descending colon  flat adenomatous appearing polyps resected with hot snare cautery as well. Tubulovillous adenomas  . Colonoscopy  December 2010    Dr. Gala Romney: Flat sessile hepatic flexure polyp status post saline-assisted piecemeal polypectomy. Fragments of adenomatous polyp with high-grade dysplasia.  . Colonoscopy  March 2011    Dr. Gala Romney: Minimal persisting polypoid tissue at site of previous piecemeal polypectomy  status post hot snare polypectomy. Tubular adenoma, no high-grade dysplasia   Family History  Problem Relation Age of Onset  . Lupus Sister   . Other Sister     ? Liver cancer   History   Social History  . Marital Status: Divorced    Spouse Name: N/A    Number of Children: N/A  . Years of Education: N/A   Social History Main Topics  . Smoking status: Never Smoker   . Smokeless tobacco: None  . Alcohol Use: No  . Drug Use: No  . Sexual Activity: None   Other Topics Concern  . None   Social History Narrative  . None    ROS:  General: Negative for anorexia, weight loss, fever, chills, fatigue, weakness. ENT: Negative for hoarseness, difficulty swallowing , nasal congestion. CV: Negative for chest pain, angina, palpitations, dyspnea on exertion, peripheral edema.  Respiratory: Negative for dyspnea at rest, dyspnea on exertion, cough, sputum, wheezing.  GI: See history of present illness. GU:  Negative for dysuria, hematuria, urinary incontinence, urinary frequency, nocturnal urination.  Endo: Negative for unusual weight change.    Physical Examination:   BP 135/78  Pulse 78  Temp(Src) 97.5 F (36.4 C) (Oral)  Ht 4\' 9"  (1.448 m)  Wt 123 lb 3.2 oz (55.883 kg)  BMI 26.65 kg/m2  General: Well-nourished, well-developed in no acute distress.  Eyes: No icterus. Mouth: Oropharyngeal mucosa moist and pink , no lesions erythema or exudate. Lungs: Clear to auscultation bilaterally.  Heart: Regular rate and rhythm, no murmurs rubs or gallops.  Abdomen: Bowel sounds are normal, mild lower abdominal tenderness to deep palpation, nondistended, no hepatosplenomegaly or masses, no abdominal bruits or hernia , no rebound or guarding.   Extremities: No lower extremity edema. No clubbing or deformities. Neuro: Alert and oriented x 4   Skin: Warm and dry, no jaundice.   Psych: Alert and cooperative, normal mood and affect.  Lab Results  Component Value Date   CREATININE 0.79  07/14/2013   BUN 14 07/14/2013   NA 140 07/14/2013   K 3.6 07/14/2013   CL 98 07/14/2013   CO2 33* 07/14/2013   Lab Results  Component Value Date   ALT 11 07/14/2013   AST 16 07/14/2013   ALKPHOS 81 07/14/2013   BILITOT 0.7 07/14/2013

## 2013-12-30 NOTE — Assessment & Plan Note (Signed)
80 year old lady with lower abdominal discomfort, intermittent in nature. She's is quite vague in her description regarding her pain, likely due to her mild dementia. Family states that she complains quite frequently of lower abdominal pain. Bentyl seems to help. She has significant history of advanced colon polyp as outlined above. History of iron deficiency anemia in the past. Has been off iron therapy for some time, niece states that her PCP stop the prescription sometime last year. Etiology of her pain is unclear. We need to consider UTI. Cannot exclude pain related to bowel function. Given the location of her pain, biliary or peptic ulcer disease less likely.  At this time, we will recheck her hemoglobin. We'll also check urinalysis with culture reflux. I have requested that she collect stool for ifobt. She will log her episodes of abdominal pain and any associating factors that she may be a determined. Based on these findings, further workup to be determined. If her symptoms worsen they will let us know.

## 2013-12-30 NOTE — Patient Instructions (Signed)
1. Please write down details about your abdominal pain we need to have it. Is there anything that you noticed that seem to contribute to the pain? Have you had a bowel movement that day?  2. Please have your blood work, urine and stool test done. Your stool specimen needs to return to our office when it is complete.  when he had it. when you have abdominal pain, whether you have eaten anything that seem to aggravate this her whether you've had any bowel movements.

## 2013-12-30 NOTE — Progress Notes (Signed)
cc'd to pcp 

## 2013-12-31 LAB — URINALYSIS W MICROSCOPIC + REFLEX CULTURE
BILIRUBIN URINE: NEGATIVE
CASTS: NONE SEEN
Crystals: NONE SEEN
GLUCOSE, UA: NEGATIVE mg/dL
HGB URINE DIPSTICK: NEGATIVE
KETONES UR: 15 mg/dL — AB
Nitrite: NEGATIVE
PH: 5 (ref 5.0–8.0)
Protein, ur: NEGATIVE mg/dL
Specific Gravity, Urine: 1.024 (ref 1.005–1.030)
Urobilinogen, UA: 0.2 mg/dL (ref 0.0–1.0)

## 2014-01-01 LAB — URINE CULTURE

## 2014-01-03 DIAGNOSIS — S065X9A Traumatic subdural hemorrhage with loss of consciousness of unspecified duration, initial encounter: Secondary | ICD-10-CM

## 2014-01-03 DIAGNOSIS — S065XAA Traumatic subdural hemorrhage with loss of consciousness status unknown, initial encounter: Secondary | ICD-10-CM

## 2014-01-03 HISTORY — DX: Traumatic subdural hemorrhage with loss of consciousness status unknown, initial encounter: S06.5XAA

## 2014-01-03 HISTORY — DX: Traumatic subdural hemorrhage with loss of consciousness of unspecified duration, initial encounter: S06.5X9A

## 2014-01-15 ENCOUNTER — Telehealth: Payer: Self-pay | Admitting: Internal Medicine

## 2014-01-15 MED ORDER — CIPROFLOXACIN HCL 250 MG PO TABS: 250.0000 mg | ORAL_TABLET | Freq: Two times a day (BID) | ORAL | Status: DC

## 2014-01-15 NOTE — Progress Notes (Signed)
Quick Note:  Pt is aware. She could not tell me the name of her pharmacy and I could not find it listed in her chart. Tried to call [employer] (emerg. Contact) LMOM asked her to call back and let me know which pharmacy she uses. ______

## 2014-01-15 NOTE — Telephone Encounter (Signed)
A lady called on patient's behalf. She [facility] in [place] and wanted me to let JL know.

## 2014-01-15 NOTE — Telephone Encounter (Signed)
rx for UTI- (see result note) sent to the pharmacy.

## 2014-01-22 ENCOUNTER — Emergency Department (HOSPITAL_COMMUNITY): Payer: Medicare Other

## 2014-01-22 ENCOUNTER — Inpatient Hospital Stay (HOSPITAL_COMMUNITY)
Admission: EM | Admit: 2014-01-22 | Discharge: 2014-01-28 | DRG: 027 | Disposition: A | Discharge status: Home / Self Care | Payer: Medicare Other | Attending: Neurological Surgery | Admitting: Neurological Surgery

## 2014-01-22 ENCOUNTER — Encounter (HOSPITAL_COMMUNITY): Payer: Self-pay | Admitting: Emergency Medicine

## 2014-01-22 DIAGNOSIS — E119 Type 2 diabetes mellitus without complications: Secondary | ICD-10-CM | POA: Diagnosis present

## 2014-01-22 DIAGNOSIS — R269 Unspecified abnormalities of gait and mobility: Secondary | ICD-10-CM | POA: Diagnosis present

## 2014-01-22 DIAGNOSIS — I10 Essential (primary) hypertension: Secondary | ICD-10-CM | POA: Diagnosis present

## 2014-01-22 DIAGNOSIS — K219 Gastro-esophageal reflux disease without esophagitis: Secondary | ICD-10-CM | POA: Diagnosis present

## 2014-01-22 DIAGNOSIS — I6200 Nontraumatic subdural hemorrhage, unspecified: Principal | ICD-10-CM | POA: Diagnosis present

## 2014-01-22 DIAGNOSIS — S065X9A Traumatic subdural hemorrhage with loss of consciousness of unspecified duration, initial encounter: Secondary | ICD-10-CM

## 2014-01-22 DIAGNOSIS — S065XAA Traumatic subdural hemorrhage with loss of consciousness status unknown, initial encounter: Secondary | ICD-10-CM

## 2014-01-22 DIAGNOSIS — F0390 Unspecified dementia without behavioral disturbance: Secondary | ICD-10-CM | POA: Diagnosis present

## 2014-01-22 LAB — URINALYSIS, ROUTINE W REFLEX MICROSCOPIC
Bilirubin Urine: NEGATIVE
Glucose, UA: NEGATIVE mg/dL
Hgb urine dipstick: NEGATIVE
Ketones, ur: NEGATIVE mg/dL
NITRITE: NEGATIVE
Protein, ur: NEGATIVE mg/dL
SPECIFIC GRAVITY, URINE: 1.01 (ref 1.005–1.030)
UROBILINOGEN UA: 0.2 mg/dL (ref 0.0–1.0)
pH: 6.5 (ref 5.0–8.0)

## 2014-01-22 LAB — CBC WITH DIFFERENTIAL/PLATELET
Basophils Absolute: 0.1 10*3/uL (ref 0.0–0.1)
Basophils Relative: 1 % (ref 0–1)
EOS PCT: 1 % (ref 0–5)
Eosinophils Absolute: 0.1 10*3/uL (ref 0.0–0.7)
HEMATOCRIT: 44 % (ref 36.0–46.0)
Hemoglobin: 14.6 g/dL (ref 12.0–15.0)
LYMPHS ABS: 1.5 10*3/uL (ref 0.7–4.0)
LYMPHS PCT: 32 % (ref 12–46)
MCH: 30.1 pg (ref 26.0–34.0)
MCHC: 33.2 g/dL (ref 30.0–36.0)
MCV: 90.7 fL (ref 78.0–100.0)
MONO ABS: 0.6 10*3/uL (ref 0.1–1.0)
Monocytes Relative: 13 % — ABNORMAL HIGH (ref 3–12)
Neutro Abs: 2.5 10*3/uL (ref 1.7–7.7)
Neutrophils Relative %: 53 % (ref 43–77)
Platelets: 268 10*3/uL (ref 150–400)
RBC: 4.85 MIL/uL (ref 3.87–5.11)
RDW: 14 % (ref 11.5–15.5)
WBC: 4.6 10*3/uL (ref 4.0–10.5)

## 2014-01-22 LAB — COMPREHENSIVE METABOLIC PANEL
ALT: 9 U/L (ref 0–35)
AST: 17 U/L (ref 0–37)
Albumin: 3.9 g/dL (ref 3.5–5.2)
Alkaline Phosphatase: 79 U/L (ref 39–117)
BUN: 11 mg/dL (ref 6–23)
CALCIUM: 10 mg/dL (ref 8.4–10.5)
CO2: 34 meq/L — AB (ref 19–32)
CREATININE: 0.68 mg/dL (ref 0.50–1.10)
Chloride: 100 mEq/L (ref 96–112)
GFR, EST NON AFRICAN AMERICAN: 82 mL/min — AB (ref >90)
GLUCOSE: 72 mg/dL (ref 70–99)
Potassium: 3.4 mEq/L — ABNORMAL LOW (ref 3.7–5.3)
Sodium: 144 mEq/L (ref 137–147)
Total Bilirubin: 0.5 mg/dL (ref 0.3–1.2)
Total Protein: 7.9 g/dL (ref 6.0–8.3)

## 2014-01-22 LAB — URINE MICROSCOPIC-ADD ON

## 2014-01-22 LAB — GLUCOSE, CAPILLARY: Glucose-Capillary: 90 mg/dL (ref 70–99)

## 2014-01-22 LAB — MRSA PCR SCREENING: MRSA BY PCR: NEGATIVE

## 2014-01-22 MED ORDER — PANTOPRAZOLE SODIUM 40 MG IV SOLR
40.0000 mg | Freq: Every day | INTRAVENOUS | Status: DC
  Administered 2014-01-22: 40 mg via INTRAVENOUS
  Filled 2014-01-22 (×2): qty 40

## 2014-01-22 MED ORDER — ACETAMINOPHEN 325 MG PO TABS
650.0000 mg | ORAL_TABLET | ORAL | Status: DC | PRN
  Administered 2014-01-23 – 2014-01-25 (×4): 650 mg via ORAL
  Filled 2014-01-22 (×4): qty 2

## 2014-01-22 MED ORDER — DICYCLOMINE HCL 10 MG PO CAPS
10.0000 mg | ORAL_CAPSULE | Freq: Two times a day (BID) | ORAL | Status: DC | PRN
  Filled 2014-01-22: qty 1

## 2014-01-22 MED ORDER — SODIUM CHLORIDE 0.9 % IV SOLN
INTRAVENOUS | Status: DC
  Administered 2014-01-22: 22:00:00 via INTRAVENOUS

## 2014-01-22 MED ORDER — LABETALOL HCL 5 MG/ML IV SOLN
10.0000 mg | INTRAVENOUS | Status: DC | PRN
  Filled 2014-01-22: qty 8

## 2014-01-22 MED ORDER — NIACIN ER (ANTIHYPERLIPIDEMIC) 500 MG PO TBCR
500.0000 mg | EXTENDED_RELEASE_TABLET | Freq: Every day | ORAL | Status: DC
  Administered 2014-01-23 – 2014-01-28 (×6): 500 mg via ORAL
  Filled 2014-01-22 (×6): qty 1

## 2014-01-22 MED ORDER — NITROFURANTOIN MONOHYD MACRO 100 MG PO CAPS
100.0000 mg | ORAL_CAPSULE | Freq: Two times a day (BID) | ORAL | Status: DC
  Administered 2014-01-22 – 2014-01-28 (×12): 100 mg via ORAL
  Filled 2014-01-22 (×15): qty 1

## 2014-01-22 MED ORDER — AMLODIPINE BESYLATE 10 MG PO TABS
10.0000 mg | ORAL_TABLET | Freq: Every day | ORAL | Status: DC
  Administered 2014-01-23 – 2014-01-28 (×5): 10 mg via ORAL
  Filled 2014-01-22 (×6): qty 1

## 2014-01-22 MED ORDER — DONEPEZIL HCL 10 MG PO TABS
10.0000 mg | ORAL_TABLET | Freq: Every day | ORAL | Status: DC
  Administered 2014-01-22 – 2014-01-27 (×6): 10 mg via ORAL
  Filled 2014-01-22 (×8): qty 1

## 2014-01-22 MED ORDER — HYDROCHLOROTHIAZIDE 25 MG PO TABS
12.5000 mg | ORAL_TABLET | Freq: Every morning | ORAL | Status: DC
  Administered 2014-01-23 – 2014-01-28 (×4): 12.5 mg via ORAL
  Filled 2014-01-22 (×6): qty 0.5

## 2014-01-22 MED ORDER — METFORMIN HCL ER 500 MG PO TB24
500.0000 mg | ORAL_TABLET | Freq: Every day | ORAL | Status: DC
  Administered 2014-01-24 – 2014-01-28 (×5): 500 mg via ORAL
  Filled 2014-01-22 (×7): qty 1

## 2014-01-22 MED ORDER — ACETAMINOPHEN 650 MG RE SUPP: 650.0000 mg | RECTAL | Status: DC | PRN

## 2014-01-22 MED ORDER — MEMANTINE HCL 5 MG PO TABS
5.0000 mg | ORAL_TABLET | Freq: Two times a day (BID) | ORAL | Status: DC
  Administered 2014-01-22 – 2014-01-28 (×12): 5 mg via ORAL
  Filled 2014-01-22 (×14): qty 1

## 2014-01-22 MED ORDER — SENNOSIDES-DOCUSATE SODIUM 8.6-50 MG PO TABS
1.0000 | ORAL_TABLET | Freq: Two times a day (BID) | ORAL | Status: DC
Start: 2014-01-22 — End: 2014-01-28
  Administered 2014-01-22 – 2014-01-28 (×10): 1 via ORAL
  Filled 2014-01-22 (×13): qty 1

## 2014-01-22 NOTE — ED Notes (Signed)
Pt niece reports pt has been off-balance, falling a lot and increasingly confused since Monday. Pt was seen by pcp on Wednesday and dx with bladder infection and started on new medication yesterday.

## 2014-01-22 NOTE — H&P (Signed)
Reason for Consult: SD[facility] Referring Physician: EDP  Taylor Andrews is an 80 y.o. female.   HPI:  80 year old female with a history of diabetes mellitus and hypertension as well as some dementia who presented to an outside emergency department with recent unsteady gait. She denies headaches. She denies trauma. She denies nausea and vomiting. She denies visual changes. Symptoms are somewhat progressive. Head CT showed a right chronic subdural hematoma and neurosurgical evaluation was requested. She denies numbness tingling or weakness.  Past Medical History  Diagnosis Date  . Diabetes mellitus   . Hypertension   . Hyperlipidemia   . Fatty liver     on [place] normal LFT'S  . Iron deficiency     Chronic current parameter normal; 4/09 iron was 12 sat 5%  . GERD (gastroesophageal reflux disease)   . Adenomatous colon polyp     carcinoma in situ    Past Surgical History  Procedure Laterality Date  . Colonoscopy  8/05    internal hemorrhoids,polyp bengin  . Esophagogastroduodenoscopy  6/05    tiny antral erosin with some deformity of pyloric channel  . Vesicovaginal fistula closure w/ tah    . Ankle surgery      Left ;pins  . Colonoscopy  01/12/2011    GMW:NUUVOZ rectum/ 5-mm polyp at the splenic flexure s/p polypectomy, tattooing, and scarred hepatic flexure, absolutely no evidence of any residual polyp tissue or neoplasm/Scattered left-sided diverticula. Tubular adenoma, no high-grade dysplasia.Repeat TCS due 01/2016  . Colonoscopy  06/16/2009    DGU:YQIHKVQQ hemorrhoids, otherwise normal rectum.Flat polypoid lesion at the hepatic flexure, debulked with piecemeal snare polypectomy.  This area was also tattooed Transverse colon and descending colon  flat adenomatous appearing polyps resected with hot snare cautery as well. Tubulovillous adenomas  . Colonoscopy  December 2010    Dr. Gala Romney: Flat sessile hepatic flexure polyp status post saline-assisted piecemeal polypectomy. Fragments of  adenomatous polyp with high-grade dysplasia.  . Colonoscopy  March 2011    Dr. Gala Romney: Minimal persisting polypoid tissue at site of previous piecemeal polypectomy status post hot snare polypectomy. Tubular adenoma, no high-grade dysplasia    Allergies  Allergen Reactions  . Iohexol      Desc: PT STATES THAT THE CONTRAST THAT SHE HAD IN 2004 MADE HER SICK, PT COULD NOT SPECIFY REACTION TYPES   . Milk-Related Compounds     Lactose Intolerant     History  Substance Use Topics  . Smoking status: Never Smoker   . Smokeless tobacco: Not on file  . Alcohol Use: No    Family History  Problem Relation Age of Onset  . Lupus Sister   . Other Sister     ? Liver cancer     Review of Systems  Positive ROS: Negative  All other systems have been reviewed and were otherwise negative with the exception of those mentioned in the HPI and as above.  Objective: Vital signs in last 24 hours: Temp:  [98.1 F (36.7 C)] 98.1 F (36.7 C) (03/20 1156) Pulse Rate:  [73-85] 73 (03/20 1851) Resp:  [16-18] 16 (03/20 1900) BP: (115-125)/(45-74) 122/47 mmHg (03/20 1900) SpO2:  [95 %-99 %] 95 % (03/20 1851) Weight:  [56.246 kg (124 lb)] 56.246 kg (124 lb) (03/20 1156)  General Appearance: Alert, cooperative, no distress, appears stated age Head: Normocephalic, without obvious abnormality, atraumatic Eyes: PERRL, conjunctiva/corneas clear, EOM's intact      Throat: benign Neck: Supple, symmetrical, trachea midline Lungs:  respirations unlabored Heart: Regular rate and  rhythm, Abdomen: Soft, non-tender Extremities: Extremities normal, atraumatic, no cyanosis or edema   NEUROLOGIC:   Mental status: A&O x4, no aphasia, good attention span, Memory and fund of knowledge Motor Exam - grossly normal, normal tone and bulk, no pronator drift Sensory Exam - grossly normal Reflexes: symmetric, no pathologic reflexes, No Hoffman's, No clonus Coordination - grossly normal, some difficulty with rapid finger  tapping Gait - not tested Balance - not tested Cranial Nerves: I: smell Not tested  II: visual acuity  OS: na    OD: na  II: visual fields Full to confrontation  II: pupils Equal, round, reactive to light  III,VII: ptosis None  III,IV,VI: extraocular muscles  Full ROM  V: mastication Normal  V: facial light touch sensation  Normal  V,VII: corneal reflex  Present  VII: facial muscle function - upper  Normal  VII: facial muscle function - lower Normal  VIII: hearing Not tested  IX: soft palate elevation  Normal  IX,X: gag reflex Present  XI: trapezius strength  5/5  XI: sternocleidomastoid strength 5/5  XI: neck flexion strength  5/5  XII: tongue strength  Normal    Data Review Lab Results  Component Value Date   WBC 4.6 01/22/2014   HGB 14.6 01/22/2014   HCT 44.0 01/22/2014   MCV 90.7 01/22/2014   PLT 268 01/22/2014   Lab Results  Component Value Date   NA 144 01/22/2014   K 3.4* 01/22/2014   CL 100 01/22/2014   CO2 34* 01/22/2014   BUN 11 01/22/2014   CREATININE 0.68 01/22/2014   GLUCOSE 72 01/22/2014   Lab Results  Component Value Date   INR 0.96 12/16/2009    Radiology: Dg Chest 2 View  01/22/2014   CLINICAL DATA:  Weakness.  EXAM: CHEST  2 VIEW  COMPARISON:  July 14, 2013.  FINDINGS: The heart size and mediastinal contours are within normal limits. Both lungs are clear. No pneumothorax or pleural effusion is noted. The visualized skeletal structures are unremarkable.  IMPRESSION: No acute cardiopulmonary abnormality seen.   Electronically Signed   By: Sabino Dick M.D.   On: 01/22/2014 14:44   Ct Head Wo Contrast  01/22/2014   CLINICAL DATA:  Confusion for several months worse for 1 week, imbalance for 5 days, leg weakness, history colon cancer, hypertension, diabetes  EXAM: CT HEAD WITHOUT CONTRAST  TECHNIQUE: Contiguous axial images were obtained from the base of the skull through the vertex without intravenous contrast.  COMPARISON:  01/31/2012  FINDINGS: Moderate to  large right subdural collection in the frontoparietal region measuring up to 20 mm thick anteriorly.  This compresses the right hemisphere and the right lateral ventricle.  Mild right to left midline shift of 4 mm.  Observed subdural collection is predominantly low to intermediate attenuation suggesting this is subacute, though there is minimal high attenuation posteriorly which could represent a small amount of acute superimposed hemorrhage.  No intraparenchymal hemorrhage,, mass lesion or evidence of acute infarction.  No left-sided extra-axial collection.  Bones and sinuses unremarkable.  IMPRESSION: Moderate to large subdural hematoma in right frontoparietal region up to 20 mm thick with compression of the right hemisphere, right lateral ventricle, and 4 mm of right to left midline shift.  Intermediate attenuation of this collection suggests this is predominantly subacute though there is a small amount of higher attenuation posteriorly/dependently which may represent acute superimposed hemorrhage.  Critical Value/emergent results were called by telephone at the time of interpretation on 01/22/2014 at 3:05  PM to Dr. Milton Ferguson , who verbally acknowledged these results.   Electronically Signed   By: Lavonia Dana M.D.   On: 01/22/2014 15:06     Assessment/Plan: Right chronic subdural hematoma. She looks fairly good neurologically at this point. I do believe that it needs surgical evacuation does not need to be done emergently. We will post her for tomorrow. I've recommended right sided burr holes versus small craniotomy evacuation of a right chronic subdural hematoma. I will discuss this further with her family either tonight or in the morning.   JONES,DAVID S 01/22/2014 8:48 PM

## 2014-01-22 NOTE — ED Provider Notes (Signed)
CSN: [id number]     Arrival date & time 01/22/14  1150 History   This chart was scribed for Taylor Diego, MD by Era Bumpers, ED scribe. This patient was seen in room APA10/APA10 and the patient's care was started at 1150.  Chief Complaint  Patient presents with  . Altered Mental Status   The history is provided by the patient. No language interpreter was used.   HPI Comments: Taylor Andrews is a 80 y.o. female who presents to the Emergency Department complaining of progressively worsening confusion over the past few months, more so worsened over the past week. She also c/o being off-balance while walking since x5 days ago. She saw her PCP Dr. Edilia Bo a few days ago who recommended to be evaluated at the ED if her sx did not improve. She reports that her legs feel week and that she has been off balance. She denies any pain over her body.    Past Medical History  Diagnosis Date  . Diabetes mellitus   . Hypertension   . Hyperlipidemia   . Fatty liver     on [place] normal LFT'S  . Iron deficiency     Chronic current parameter normal; 4/09 iron was 12 sat 5%  . GERD (gastroesophageal reflux disease)   . Adenomatous colon polyp     carcinoma in situ   Past Surgical History  Procedure Laterality Date  . Colonoscopy  8/05    internal hemorrhoids,polyp bengin  . Esophagogastroduodenoscopy  6/05    tiny antral erosin with some deformity of pyloric channel  . Vesicovaginal fistula closure w/ tah    . Ankle surgery      Left ;pins  . Colonoscopy  01/12/2011    FYB:OFBPZW rectum/ 5-mm polyp at the splenic flexure s/p polypectomy, tattooing, and scarred hepatic flexure, absolutely no evidence of any residual polyp tissue or neoplasm/Scattered left-sided diverticula. Tubular adenoma, no high-grade dysplasia.Repeat TCS due 01/2016  . Colonoscopy  06/16/2009    CHE:NIDPOEUM hemorrhoids, otherwise normal rectum.Flat polypoid lesion at the hepatic flexure, debulked with piecemeal snare polypectomy.   This area was also tattooed Transverse colon and descending colon  flat adenomatous appearing polyps resected with hot snare cautery as well. Tubulovillous adenomas  . Colonoscopy  December 2010    Dr. Gala Romney: Flat sessile hepatic flexure polyp status post saline-assisted piecemeal polypectomy. Fragments of adenomatous polyp with high-grade dysplasia.  . Colonoscopy  March 2011    Dr. Gala Romney: Minimal persisting polypoid tissue at site of previous piecemeal polypectomy status post hot snare polypectomy. Tubular adenoma, no high-grade dysplasia   Family History  Problem Relation Age of Onset  . Lupus Sister   . Other Sister     ? Liver cancer   History  Substance Use Topics  . Smoking status: Never Smoker   . Smokeless tobacco: Not on file  . Alcohol Use: No   OB History   Grav Para Term Preterm Abortions TAB SAB Ect Mult Living                 Review of Systems  Constitutional: Negative for fever and chills.  Respiratory: Negative for cough and shortness of breath.   Cardiovascular: Negative for chest pain.  Gastrointestinal: Negative for nausea and vomiting.  Musculoskeletal: Negative for back pain.  Skin: Negative for rash.  Neurological:       Off-balance  Psychiatric/Behavioral: Positive for confusion.  All other systems reviewed and are negative.    Allergies  Iohexol and Milk-related  compounds  Home Medications   Current Outpatient Rx  Name  Route  Sig  Dispense  Refill  . amLODipine (NORVASC) 10 MG tablet   Oral   Take 10 mg by mouth daily.          Marland Kitchen donepezil (ARICEPT) 10 MG tablet   Oral   Take 10 mg by mouth at bedtime.         . hydrochlorothiazide 25 MG tablet   Oral   Take 12.5 mg by mouth every morning.          . metFORMIN (GLUCOPHAGE-XR) 500 MG 24 hr tablet   Oral   Take 500 mg by mouth every morning.          Marland Kitchen NAMENDA 5 MG tablet   Oral   Take 5 mg by mouth 2 (two) times daily.          . niacin (NIASPAN) 500 MG CR tablet    Oral   Take 500 mg by mouth daily.          . nitrofurantoin, macrocrystal-monohydrate, (MACROBID) 100 MG capsule   Oral   Take 100 mg by mouth 2 (two) times daily.         . ciprofloxacin (CIPRO) 250 MG tablet   Oral   Take 1 tablet (250 mg total) by mouth 2 (two) times daily.   6 tablet   0   . dicyclomine (BENTYL) 10 MG capsule   Oral   Take 10 mg by mouth 2 (two) times daily as needed for spasms.           Triage Vitals: BP 115/74  Pulse 78  Temp(Src) 98.1 F (36.7 C)  Resp 18  Ht 4' 9.5" (1.461 m)  Wt 124 lb (56.246 kg)  BMI 26.35 kg/m2  SpO2 99%  Physical Exam  Nursing note and vitals reviewed. Constitutional: She appears well-developed and well-nourished. No distress.  HENT:  Head: Normocephalic and atraumatic.  Eyes: Conjunctivae are normal. Right eye exhibits no discharge. Left eye exhibits no discharge.  Neck: Normal range of motion.  Cardiovascular: Normal rate.   Pulmonary/Chest: Effort normal. No respiratory distress.  Musculoskeletal: Normal range of motion. She exhibits no edema.  Neurological: She is alert.  Mildly ataxic Oriented to person but not place or time    Skin: Skin is warm and dry.  Psychiatric: She has a normal mood and affect. Thought content normal.    ED Course  Procedures (including critical care time) DIAGNOSTIC STUDIES: Oxygen Saturation is 99% on room air, normal by my interpretation.    COORDINATION OF CARE: At 150 PM Discussed treatment plan with patient which includes CXR, head CT, blood work. Patient agrees.   Labs Review Labs Reviewed  CBC WITH DIFFERENTIAL  COMPREHENSIVE METABOLIC PANEL  URINALYSIS, ROUTINE W REFLEX MICROSCOPIC   Imaging Review No results found.   EKG Interpretation None     CRITICAL CARE Performed by: Delford Wingert L Total critical care time: 40 Critical care time was exclusive of separately billable procedures and treating other patients. Critical care was necessary to treat or  prevent imminent or life-threatening deterioration. Critical care was time spent personally by me on the following activities: development of treatment plan with patient and/or surrogate as well as nursing, discussions with consultants, evaluation of patient's response to treatment, examination of patient, obtaining history from patient or surrogate, ordering and performing treatments and interventions, ordering and review of laboratory studies, ordering and review of radiographic studies, pulse oximetry and  re-evaluation of patient's condition.   MDM   Final diagnoses:  None    Dr, Sherley Bounds to admit to neuro -icu adt cone I personally performed the services described in this documentation, which was scribed in my presence. The recorded information has been reviewed and is accurate.      Taylor Diego, MD 01/22/14 [phone]

## 2014-01-23 ENCOUNTER — Encounter (HOSPITAL_COMMUNITY): Payer: Self-pay | Admitting: Certified Registered Nurse Anesthetist

## 2014-01-23 ENCOUNTER — Encounter (HOSPITAL_COMMUNITY): Payer: Medicare Other | Admitting: Certified Registered Nurse Anesthetist

## 2014-01-23 ENCOUNTER — Inpatient Hospital Stay: Admit: 2014-01-23 | Payer: Self-pay | Admitting: Neurological Surgery

## 2014-01-23 ENCOUNTER — Inpatient Hospital Stay (HOSPITAL_COMMUNITY): Payer: Medicare Other | Admitting: Certified Registered Nurse Anesthetist

## 2014-01-23 ENCOUNTER — Encounter (HOSPITAL_COMMUNITY)
Admission: EM | Disposition: A | Discharge status: Home / Self Care | Payer: Self-pay | Source: Home / Self Care | Attending: Neurological Surgery

## 2014-01-23 ENCOUNTER — Inpatient Hospital Stay (HOSPITAL_COMMUNITY): Payer: Medicare Other

## 2014-01-23 HISTORY — PX: CRANIOTOMY: SHX93

## 2014-01-23 LAB — GLUCOSE, CAPILLARY
GLUCOSE-CAPILLARY: 125 mg/dL — AB (ref 70–99)
GLUCOSE-CAPILLARY: 132 mg/dL — AB (ref 70–99)
GLUCOSE-CAPILLARY: 126 mg/dL — AB (ref 70–99)
Glucose-Capillary: 88 mg/dL (ref 70–99)

## 2014-01-23 LAB — PROTIME-INR
INR: 0.93 (ref 0.00–1.49)
PROTHROMBIN TIME: 12.3 s (ref 11.6–15.2)

## 2014-01-23 SURGERY — CRANIOTOMY HEMATOMA EVACUATION SUBDURAL
Anesthesia: General | Site: Head | Laterality: Right

## 2014-01-23 MED ORDER — ONDANSETRON HCL 4 MG/2ML IJ SOLN
4.0000 mg | INTRAMUSCULAR | Status: DC | PRN
  Administered 2014-01-24: 4 mg via INTRAVENOUS
  Filled 2014-01-23: qty 2

## 2014-01-23 MED ORDER — ONDANSETRON HCL 4 MG PO TABS: 4.0000 mg | ORAL_TABLET | ORAL | Status: DC | PRN

## 2014-01-23 MED ORDER — LACTATED RINGERS IV SOLN
INTRAVENOUS | Status: DC | PRN
  Administered 2014-01-23 (×2): via INTRAVENOUS

## 2014-01-23 MED ORDER — FENTANYL CITRATE 0.05 MG/ML IJ SOLN
INTRAMUSCULAR | Status: DC | PRN
  Administered 2014-01-23 (×2): 50 ug via INTRAVENOUS

## 2014-01-23 MED ORDER — PROPOFOL 10 MG/ML IV BOLUS
INTRAVENOUS | Status: DC | PRN
  Administered 2014-01-23: 30 mg via INTRAVENOUS
  Administered 2014-01-23: 120 mg via INTRAVENOUS

## 2014-01-23 MED ORDER — LIDOCAINE HCL (CARDIAC) 20 MG/ML IV SOLN
INTRAVENOUS | Status: DC | PRN
  Administered 2014-01-23: 60 mg via INTRAVENOUS

## 2014-01-23 MED ORDER — NEOSTIGMINE METHYLSULFATE 1 MG/ML IJ SOLN
INTRAMUSCULAR | Status: DC | PRN
  Administered 2014-01-23: 3 mg via INTRAVENOUS

## 2014-01-23 MED ORDER — PROPOFOL 10 MG/ML IV BOLUS
INTRAVENOUS | Status: AC
  Filled 2014-01-23: qty 20

## 2014-01-23 MED ORDER — FENTANYL CITRATE 0.05 MG/ML IJ SOLN
INTRAMUSCULAR | Status: AC
  Filled 2014-01-23: qty 5

## 2014-01-23 MED ORDER — ROCURONIUM BROMIDE 100 MG/10ML IV SOLN
INTRAVENOUS | Status: DC | PRN
  Administered 2014-01-23: 30 mg via INTRAVENOUS

## 2014-01-23 MED ORDER — PANTOPRAZOLE SODIUM 40 MG IV SOLR
40.0000 mg | Freq: Every day | INTRAVENOUS | Status: DC
Start: 2014-01-23 — End: 2014-01-23

## 2014-01-23 MED ORDER — EPHEDRINE SULFATE 50 MG/ML IJ SOLN
INTRAMUSCULAR | Status: DC | PRN
  Administered 2014-01-23 (×2): 10 mg via INTRAVENOUS

## 2014-01-23 MED ORDER — LIDOCAINE HCL (CARDIAC) 20 MG/ML IV SOLN
INTRAVENOUS | Status: AC
  Filled 2014-01-23: qty 5

## 2014-01-23 MED ORDER — MIDAZOLAM HCL 2 MG/2ML IJ SOLN
INTRAMUSCULAR | Status: AC
  Filled 2014-01-23: qty 2

## 2014-01-23 MED ORDER — SODIUM CHLORIDE 0.9 % IR SOLN
Status: DC | PRN
  Administered 2014-01-23: 11:00:00

## 2014-01-23 MED ORDER — THROMBIN 20000 UNITS EX KIT
PACK | CUTANEOUS | Status: DC | PRN
  Administered 2014-01-23: 11:00:00 via TOPICAL

## 2014-01-23 MED ORDER — PHENYLEPHRINE HCL 10 MG/ML IJ SOLN
INTRAMUSCULAR | Status: DC | PRN
  Administered 2014-01-23: 80 ug via INTRAVENOUS
  Administered 2014-01-23: 40 ug via INTRAVENOUS
  Administered 2014-01-23: 80 ug via INTRAVENOUS
  Administered 2014-01-23 (×4): 40 ug via INTRAVENOUS
  Administered 2014-01-23: 80 ug via INTRAVENOUS

## 2014-01-23 MED ORDER — ESMOLOL HCL 10 MG/ML IV SOLN
INTRAVENOUS | Status: AC
  Filled 2014-01-23: qty 10

## 2014-01-23 MED ORDER — ONDANSETRON HCL 4 MG/2ML IJ SOLN
INTRAMUSCULAR | Status: AC
  Filled 2014-01-23: qty 2

## 2014-01-23 MED ORDER — ONDANSETRON HCL 4 MG/2ML IJ SOLN
INTRAMUSCULAR | Status: DC | PRN
  Administered 2014-01-23: 4 mg via INTRAVENOUS

## 2014-01-23 MED ORDER — CEFAZOLIN SODIUM-DEXTROSE 2-3 GM-% IV SOLR
INTRAVENOUS | Status: DC | PRN
  Administered 2014-01-23: 2 g via INTRAVENOUS

## 2014-01-23 MED ORDER — PHENYLEPHRINE HCL 10 MG/ML IJ SOLN
INTRAMUSCULAR | Status: AC
  Filled 2014-01-23: qty 1

## 2014-01-23 MED ORDER — LABETALOL HCL 5 MG/ML IV SOLN
10.0000 mg | INTRAVENOUS | Status: DC | PRN
  Filled 2014-01-23: qty 8

## 2014-01-23 MED ORDER — ROCURONIUM BROMIDE 50 MG/5ML IV SOLN
INTRAVENOUS | Status: AC
  Filled 2014-01-23: qty 1

## 2014-01-23 MED ORDER — CEFAZOLIN SODIUM 1-5 GM-% IV SOLN
1.0000 g | Freq: Three times a day (TID) | INTRAVENOUS | Status: AC
  Administered 2014-01-23 (×2): 1 g via INTRAVENOUS
  Filled 2014-01-23 (×2): qty 50

## 2014-01-23 MED ORDER — HEMOSTATIC AGENTS (NO CHARGE) OPTIME
TOPICAL | Status: DC | PRN
  Administered 2014-01-23: 1 via TOPICAL

## 2014-01-23 MED ORDER — POTASSIUM CHLORIDE IN NACL 20-0.9 MEQ/L-% IV SOLN
INTRAVENOUS | Status: DC
  Administered 2014-01-23: 13:00:00 via INTRAVENOUS
  Filled 2014-01-23 (×2): qty 1000

## 2014-01-23 MED ORDER — 0.9 % SODIUM CHLORIDE (POUR BTL) OPTIME
TOPICAL | Status: DC | PRN
  Administered 2014-01-23: 1000 mL

## 2014-01-23 MED ORDER — GLYCOPYRROLATE 0.2 MG/ML IJ SOLN
INTRAMUSCULAR | Status: DC | PRN
  Administered 2014-01-23: .6 mg via INTRAVENOUS

## 2014-01-23 MED ORDER — MORPHINE SULFATE 2 MG/ML IJ SOLN
1.0000 mg | INTRAMUSCULAR | Status: DC | PRN
  Administered 2014-01-23 (×2): 2 mg via INTRAVENOUS
  Filled 2014-01-23: qty 1

## 2014-01-23 MED ORDER — SODIUM CHLORIDE 0.9 % IV SOLN
500.0000 mg | Freq: Two times a day (BID) | INTRAVENOUS | Status: DC
  Administered 2014-01-23 – 2014-01-24 (×2): 500 mg via INTRAVENOUS
  Filled 2014-01-23 (×3): qty 5

## 2014-01-23 MED ORDER — ONDANSETRON HCL 4 MG/2ML IJ SOLN: 4.0000 mg | Freq: Once | INTRAMUSCULAR | Status: DC | PRN

## 2014-01-23 MED ORDER — ESMOLOL HCL 10 MG/ML IV SOLN
INTRAVENOUS | Status: DC | PRN
  Administered 2014-01-23: 20 mg via INTRAVENOUS

## 2014-01-23 MED ORDER — LIDOCAINE-EPINEPHRINE 0.5 %-1:200000 IJ SOLN
INTRAMUSCULAR | Status: DC | PRN
  Administered 2014-01-23: 8 mL via INTRADERMAL

## 2014-01-23 MED ORDER — HYDROMORPHONE HCL PF 1 MG/ML IJ SOLN: 0.2500 mg | INTRAMUSCULAR | Status: DC | PRN

## 2014-01-23 MED ORDER — PANTOPRAZOLE SODIUM 40 MG PO TBEC
40.0000 mg | DELAYED_RELEASE_TABLET | Freq: Every day | ORAL | Status: DC
  Administered 2014-01-23 – 2014-01-27 (×5): 40 mg via ORAL
  Filled 2014-01-23 (×5): qty 1

## 2014-01-23 SURGICAL SUPPLY — 59 items
BAG DECANTER FOR FLEXI CONT (MISCELLANEOUS) ×3 IMPLANT
BUR ACORN 6.0 ACORN (BURR) ×2 IMPLANT
BUR ACORN 6.0MM ACORN (BURR) ×1
BUR ROUTER D-58 CRANI (BURR) ×6 IMPLANT
CANISTER SUCT 3000ML (MISCELLANEOUS) ×3 IMPLANT
CATH VENTRIC 35X38 W/TROCAR LG (CATHETERS) ×3 IMPLANT
CLIP TI MEDIUM 6 (CLIP) IMPLANT
CONT SPEC 4OZ CLIKSEAL STRL BL (MISCELLANEOUS) ×3 IMPLANT
CORDS BIPOLAR (ELECTRODE) ×3 IMPLANT
DRAIN BAG CSF ACCUDRAIN (MISCELLANEOUS) ×3 IMPLANT
DRAPE MICROSCOPE ZEISS OPMI (DRAPES) IMPLANT
DRAPE NEUROLOGICAL W/INCISE (DRAPES) ×3 IMPLANT
DRAPE SURG 17X23 STRL (DRAPES) IMPLANT
DRAPE WARM FLUID 44X44 (DRAPE) ×3 IMPLANT
DRESSING TELFA 8X3 (GAUZE/BANDAGES/DRESSINGS) IMPLANT
DRSG OPSITE POSTOP 4X6 (GAUZE/BANDAGES/DRESSINGS) ×3 IMPLANT
DURAPREP 6ML APPLICATOR 50/CS (WOUND CARE) ×3 IMPLANT
ELECT CAUTERY BLADE 6.4 (BLADE) ×3 IMPLANT
ELECT REM PT RETURN 9FT ADLT (ELECTROSURGICAL) ×3
ELECTRODE REM PT RTRN 9FT ADLT (ELECTROSURGICAL) ×1 IMPLANT
EVACUATOR 1/8 PVC DRAIN (DRAIN) IMPLANT
GAUZE SPONGE 4X4 16PLY XRAY LF (GAUZE/BANDAGES/DRESSINGS) IMPLANT
GLOVE BIO SURGEON STRL SZ8 (GLOVE) ×3 IMPLANT
GOWN BRE IMP SLV AUR LG STRL (GOWN DISPOSABLE) IMPLANT
GOWN BRE IMP SLV AUR XL STRL (GOWN DISPOSABLE) IMPLANT
GOWN STRL REIN 2XL LVL4 (GOWN DISPOSABLE) ×3 IMPLANT
HEMOSTAT POWDER KIT SURGIFOAM (HEMOSTASIS) IMPLANT
HOOK DURA (MISCELLANEOUS) ×3 IMPLANT
KIT BASIN OR (CUSTOM PROCEDURE TRAY) ×3 IMPLANT
KIT ROOM TURNOVER OR (KITS) ×3 IMPLANT
NEEDLE HYPO 22GX1.5 SAFETY (NEEDLE) ×3 IMPLANT
NS IRRIG 1000ML POUR BTL (IV SOLUTION) ×3 IMPLANT
PACK CRANIOTOMY (CUSTOM PROCEDURE TRAY) ×3 IMPLANT
PAD ARMBOARD 7.5X6 YLW CONV (MISCELLANEOUS) ×9 IMPLANT
PATTIES SURGICAL .25X.25 (GAUZE/BANDAGES/DRESSINGS) IMPLANT
PATTIES SURGICAL .5 X.5 (GAUZE/BANDAGES/DRESSINGS) IMPLANT
PATTIES SURGICAL .5 X3 (DISPOSABLE) IMPLANT
PATTIES SURGICAL 1X1 (DISPOSABLE) IMPLANT
PERFORATOR LRG  14-11MM (BIT)
PERFORATOR LRG 14-11MM (BIT) IMPLANT
PIN MAYFIELD SKULL DISP (PIN) ×3 IMPLANT
PLATE 1.5  2HOLE MED NEURO (Plate) ×6 IMPLANT
PLATE 1.5 2HOLE MED NEURO (Plate) ×3 IMPLANT
RUBBERBAND STERILE (MISCELLANEOUS) IMPLANT
SCREW SELF DRILL HT 1.5/4MM (Screw) ×18 IMPLANT
SPONGE GAUZE 4X4 12PLY (GAUZE/BANDAGES/DRESSINGS) ×3 IMPLANT
SPONGE NEURO XRAY DETECT 1X3 (DISPOSABLE) IMPLANT
SPONGE SURGIFOAM ABS GEL 100 (HEMOSTASIS) ×3 IMPLANT
STAPLER VISISTAT 35W (STAPLE) ×3 IMPLANT
SUT ETHILON 3 0 FSL (SUTURE) IMPLANT
SUT NURALON 4 0 TR CR/8 (SUTURE) ×3 IMPLANT
SUT VIC AB 2-0 CP2 18 (SUTURE) ×6 IMPLANT
SYR 20ML ECCENTRIC (SYRINGE) ×3 IMPLANT
SYR CONTROL 10ML LL (SYRINGE) ×3 IMPLANT
TOWEL OR 17X24 6PK STRL BLUE (TOWEL DISPOSABLE) ×3 IMPLANT
TOWEL OR 17X26 10 PK STRL BLUE (TOWEL DISPOSABLE) ×3 IMPLANT
TRAY FOLEY CATH 14FRSI W/METER (CATHETERS) IMPLANT
UNDERPAD 30X30 INCONTINENT (UNDERPADS AND DIAPERS) IMPLANT
WATER STERILE IRR 1000ML POUR (IV SOLUTION) ×3 IMPLANT

## 2014-01-23 NOTE — Op Note (Signed)
01/22/2014 - 01/23/2014  11:17 AM  PATIENT:  Taylor Andrews  80 y.o. female  PRE-OPERATIVE DIAGNOSIS:  Right chronic subdural hematoma  POST-OPERATIVE DIAGNOSIS:  Same  PROCEDURE:  Right craniotomy for evacuation of chronic subdural hematoma with placement of subdural drain  SURGEON:  Sherley Bounds, MD  ASSISTANTS: Dr. Ellene Route  ANESTHESIA:   General  EBL: Less than 50 ml  Total I/O In: 100 [I.V.:100] Out: 90 [Urine:40; Blood:50]  BLOOD ADMINISTERED:none  DRAINS: Subdural drain   SPECIMEN:  No Specimen  INDICATION FOR PROCEDURE: This patient presented with more recent stumbling gait and difficulty with mentation. CT scan showed a large right chronic subdural hematoma. I recommended a craniotomy for evacuation. Patient understood the risks, benefits, and alternatives and potential outcomes and wished to proceed.  PROCEDURE DETAILS: The patient was taken to the operating room and after induction of adequate generalized endotracheal anesthesia, the head was affixed in a 3 point [place] head rest, and turned to the left to expose the right frontotemporal parietal region. The head was shaved and then cleaned and then prepped with DuraPrep and draped in the usual sterile fashion. 10 cc of local anesthetic was injected, and a curvy linear right frontal incision was made on the right of the head. Raney clips were placed to establish hemostasis of the scalp, the muscle was reflected with the scalp flap, to expose the right frontal region. A burr hole was placed, and a craniotomy flap was turned utilizing the high-speed, air poweedr drill. The flap was then placed in bacitracin-containing saline solution, and the dura was opened to expose a right chronic subdural hematoma. There was release of motor oil like fluid. A hematoma was then removed with a combination of irrigation and suction, and the membrane overlying the brain was opened and fenestrated widely. I continued to irrigate until the irrigant  was clear to, and dried any bleeding with bipolar cautery. I then placed a subdural drain through separate stab incision and close the dura with a running 4-0 Nurolon suture. Dural tack up sutures were placed. The dura was lined with Gelfoam, and the craniotomy flap was replaced with doggie-bone plates. The wound was copiously irrigated.  the galea was then closed with interrupted 2-0 Vicryl suture. The skin was then closed with staples a sterile dressing was applied. The patient was then taken out of the 3-point Mayfield headrest and awakened from general anesthesia, and transported to the recovery room in stable condition. At the end of the procedure all sponge, needle, and instrument counts were correct.   PLAN OF CARE: Admit to inpatient   PATIENT DISPOSITION:  PACU - hemodynamically stable.   Delay start of Pharmacological VTE agent (>24hrs) due to surgical blood loss or risk of bleeding:  yes

## 2014-01-23 NOTE — Anesthesia Postprocedure Evaluation (Signed)
  Anesthesia Post-op Note  Patient: Taylor Andrews  Procedure(s) Performed: Procedure(s): CRANIOTOMY HEMATOMA EVACUATION SUBDURAL (Right)  Patient Location: PACU  Anesthesia Type:General  Level of Consciousness: awake, sedated and patient cooperative  Airway and Oxygen Therapy: Patient Spontanous Breathing  Post-op Pain: none  Post-op Assessment: Post-op Vital signs reviewed, Patient's Cardiovascular Status Stable, Respiratory Function Stable, Patent Airway, No signs of Nausea or vomiting and Pain level controlled  Post-op Vital Signs: stable  Complications: No apparent anesthesia complications

## 2014-01-23 NOTE — Preoperative (Signed)
Beta Blockers   Reason not to administer Beta Blockers:Not Applicable 

## 2014-01-23 NOTE — Progress Notes (Signed)
Patient ID: Taylor Andrews, female   DOB: 30-Apr-1934, 80 y.o.   MRN: [id number] I just had a long discussion with 4 of her family members. We discussed the right chronic subdural hematoma and my plan the right craniotomy for evacuation of the subdural hematoma. I explained the reasoning for offering this surgery.They state she did have a fall down the stairs several weeks ago. They have noticed worsening mentation and more "stumbling." I have described the surgery to them. I have answered their questions to the best of my ability. They understand the risk of the surgery include but are not limited to leading, infection, stroke, brain injury, numbness, weakness, loss of voice or speech, loss of vision, for further surgery, acute bleeding, lack of relief of symptoms, worsening symptoms, and anesthesia risk including MI pneumonia DVT and death. They agreed to proceed.

## 2014-01-23 NOTE — Transfer of Care (Signed)
Immediate Anesthesia Transfer of Care Note  Patient: Taylor Andrews  Procedure(s) Performed: Procedure(s): CRANIOTOMY HEMATOMA EVACUATION SUBDURAL (Right)  Patient Location: PACU  Anesthesia Type:General  Level of Consciousness: awake, alert  and patient cooperative  Airway & Oxygen Therapy: Patient Spontanous Breathing  Post-op Assessment: Report given to PACU RN  Post vital signs: Reviewed and stable  Complications: No apparent anesthesia complications

## 2014-01-23 NOTE — Anesthesia Preprocedure Evaluation (Addendum)
Anesthesia Evaluation  Patient identified by MRN, date of birth, ID band Patient awake    Reviewed: Allergy & Precautions, H&P , NPO status , Patient's Chart, lab work & pertinent test results  Airway Mallampati: II TM Distance: >3 FB Neck ROM: Full    Dental  (+) Edentulous Upper, Partial Lower   Pulmonary  breath sounds clear to auscultation        Cardiovascular hypertension, Rhythm:Regular Rate:Normal     Neuro/Psych    GI/Hepatic GERD-  ,  Endo/Other  diabetes, Type 2, Oral Hypoglycemic Agents  Renal/GU      Musculoskeletal   Abdominal   Peds  Hematology  (+) anemia ,   Anesthesia Other Findings   Reproductive/Obstetrics                          Anesthesia Physical Anesthesia Plan  ASA: III  Anesthesia Plan: General   Post-op Pain Management:    Induction: Intravenous  Airway Management Planned: Oral ETT  Additional Equipment:   Intra-op Plan:   Post-operative Plan: Extubation in OR and Possible Post-op intubation/ventilation  Informed Consent: I have reviewed the patients History and Physical, chart, labs and discussed the procedure including the risks, benefits and alternatives for the proposed anesthesia with the patient or authorized representative who has indicated his/her understanding and acceptance.   Dental advisory given  Plan Discussed with: CRNA and Anesthesiologist  Anesthesia Plan Comments:        Anesthesia Quick Evaluation

## 2014-01-24 ENCOUNTER — Inpatient Hospital Stay (HOSPITAL_COMMUNITY): Payer: Medicare Other

## 2014-01-24 LAB — GLUCOSE, CAPILLARY
GLUCOSE-CAPILLARY: 122 mg/dL — AB (ref 70–99)
GLUCOSE-CAPILLARY: 137 mg/dL — AB (ref 70–99)
Glucose-Capillary: 122 mg/dL — ABNORMAL HIGH (ref 70–99)
Glucose-Capillary: 120 mg/dL — ABNORMAL HIGH (ref 70–99)
Glucose-Capillary: 124 mg/dL — ABNORMAL HIGH (ref 70–99)

## 2014-01-24 MED ORDER — LEVETIRACETAM 250 MG PO TABS
250.0000 mg | ORAL_TABLET | Freq: Two times a day (BID) | ORAL | Status: DC
  Administered 2014-01-24 – 2014-01-28 (×9): 250 mg via ORAL
  Filled 2014-01-24 (×11): qty 1

## 2014-01-24 NOTE — Progress Notes (Signed)
Patient ID: Taylor Andrews, female   DOB: 12-Aug-1934, 80 y.o.   MRN: [id number] Subjective: Patient reports no headache  Objective: Vital signs in last 24 hours: Temp:  [97.8 F (36.6 C)-99.9 F (37.7 C)] 99.9 F (37.7 C) (03/22 0816) Pulse Rate:  [78-120] 96 (03/22 0700) Resp:  [14-24] 22 (03/22 0700) BP: (101-148)/(31-72) 101/72 mmHg (03/22 0700) SpO2:  [92 %-100 %] 94 % (03/22 0700) Weight:  [56.4 kg (124 lb 5.4 oz)] 56.4 kg (124 lb 5.4 oz) (03/22 0400)  Intake/Output from previous day: 03/21 0701 - 03/22 0700 In: 3371.7 [P.O.:420; I.V.:2641.7; IV Piggyback:310] Out: 2319 [Urine:2160; Drains:109; Blood:50] Intake/Output this shift: Total I/O In: 50 [I.V.:50] Out: 220 [Urine:220]  Neurologic: Grossly normal, maybe a little sleepier than yesterday but arouses very easily excised open. She answers questions fairly appropriately. She knows her name and place not age. Moves all extremities well. Feeding herself breakfast. Incision looks good.  Lab Results: Lab Results  Component Value Date   WBC 4.6 01/22/2014   HGB 14.6 01/22/2014   HCT 44.0 01/22/2014   MCV 90.7 01/22/2014   PLT 268 01/22/2014   Lab Results  Component Value Date   INR 0.93 01/22/2014   BMET Lab Results  Component Value Date   NA 144 01/22/2014   K 3.4* 01/22/2014   CL 100 01/22/2014   CO2 34* 01/22/2014   GLUCOSE 72 01/22/2014   BUN 11 01/22/2014   CREATININE 0.68 01/22/2014   CALCIUM 10.0 01/22/2014    Studies/Results: Dg Chest 2 View  01/22/2014   CLINICAL DATA:  Weakness.  EXAM: CHEST  2 VIEW  COMPARISON:  July 14, 2013.  FINDINGS: The heart size and mediastinal contours are within normal limits. Both lungs are clear. No pneumothorax or pleural effusion is noted. The visualized skeletal structures are unremarkable.  IMPRESSION: No acute cardiopulmonary abnormality seen.   Electronically Signed   By: Sabino Dick M.D.   On: 01/22/2014 14:44   Ct Head Wo Contrast  01/24/2014   CLINICAL DATA:  Followup  subdural hematoma  EXAM: CT HEAD WITHOUT CONTRAST  TECHNIQUE: Contiguous axial images were obtained from the base of the skull through the vertex without intravenous contrast.  COMPARISON:  Comparison 01/22/2014 and previous  FINDINGS: Subdural drain has been placed from the site of a right frontal craniotomy, lying within the right sided subdural collection. Much of the subdural fluid has been drained but perhaps 30% remains. There is subdural air in the nondependent portions. There is less mass effect with right-to-left shift of only 2 mm presently. No new bleeding is seen. No evidence of brain parenchymal complication such as infarction or intraparenchymal hemorrhage.  IMPRESSION: Right-sided subdural drain placed with removal of the majority of the right-sided subdural fluid. Less mass effect with right-to-left shift reduced from 4 mm to 2 mm.   Electronically Signed   By: Nelson Chimes M.D.   On: 01/24/2014 08:07   Ct Head Wo Contrast  01/22/2014   CLINICAL DATA:  Confusion for several months worse for 1 week, imbalance for 5 days, leg weakness, history colon cancer, hypertension, diabetes  EXAM: CT HEAD WITHOUT CONTRAST  TECHNIQUE: Contiguous axial images were obtained from the base of the skull through the vertex without intravenous contrast.  COMPARISON:  01/31/2012  FINDINGS: Moderate to large right subdural collection in the frontoparietal region measuring up to 20 mm thick anteriorly.  This compresses the right hemisphere and the right lateral ventricle.  Mild right to left midline shift of  4 mm.  Observed subdural collection is predominantly low to intermediate attenuation suggesting this is subacute, though there is minimal high attenuation posteriorly which could represent a small amount of acute superimposed hemorrhage.  No intraparenchymal hemorrhage,, mass lesion or evidence of acute infarction.  No left-sided extra-axial collection.  Bones and sinuses unremarkable.  IMPRESSION: Moderate to large  subdural hematoma in right frontoparietal region up to 20 mm thick with compression of the right hemisphere, right lateral ventricle, and 4 mm of right to left midline shift.  Intermediate attenuation of this collection suggests this is predominantly subacute though there is a small amount of higher attenuation posteriorly/dependently which may represent acute superimposed hemorrhage.  Critical Value/emergent results were called by telephone at the time of interpretation on 01/22/2014 at 3:05 PM to Dr. Milton Ferguson , who verbally acknowledged these results.   Electronically Signed   By: Lavonia Dana M.D.   On: 01/22/2014 15:06   Chest Port 1 View  01/23/2014   CLINICAL DATA:  Pre-op, subdural hematoma  EXAM: PORTABLE CHEST - 1 VIEW  COMPARISON:  DG CHEST 2 VIEW dated 01/22/2014  FINDINGS: The heart size and mediastinal contours are within normal limits. Both lungs are clear. The visualized skeletal structures are unremarkable.  IMPRESSION: No active disease.   Electronically Signed   By: Margaree Mackintosh M.D.   On: 01/23/2014 08:05    Assessment/Plan: Seems to be doing fairly well. Order therapy for her. Her to mobilize her. Drain removed. CT scan looks as expected.   LOS: 2 days    Koki Buxton S 01/24/2014, 9:20 AM

## 2014-01-25 LAB — GLUCOSE, CAPILLARY
GLUCOSE-CAPILLARY: 81 mg/dL (ref 70–99)
Glucose-Capillary: 109 mg/dL — ABNORMAL HIGH (ref 70–99)
Glucose-Capillary: 90 mg/dL (ref 70–99)

## 2014-01-25 NOTE — Evaluation (Signed)
Physical Therapy Evaluation Patient Details Name: Taylor Andrews MRN: [id number] DOB: July 17, 1934 Today's Date: 01/25/2014 Time: 1751-0258 PT Time Calculation (min): 41 min  PT Assessment / Plan / Recommendation History of Present Illness  Pt s/p R craniotomy for excision of chronic R subdural hematoma.  Clinical Impression  Pt functioning at minA level with baseline of dementia. Spoke with Taylor Andrews, niece, who reports d/c plan is for pt to return home with her mom (pt's sister-in-law) who will provide 24/7 supervision. Recommend HHPT and RW in addition to 24/7 supervision for safe d/c home.    PT Assessment  Patient needs continued PT services    Follow Up Recommendations  Home health PT;Supervision/Assistance - 24 hour    Does the patient have the potential to tolerate intense rehabilitation      Barriers to Discharge        Equipment Recommendations  Rolling walker with 5" wheels    Recommendations for Other Services     Frequency Min 4X/week    Precautions / Restrictions Precautions Precautions: Fall Restrictions Weight Bearing Restrictions: No   Pertinent Vitals/Pain Denies pain      Mobility  Bed Mobility Overal bed mobility: Needs Assistance Bed Mobility: Supine to Sit Supine to sit: Min assist General bed mobility comments: max directional verbal and tactile cues to complete task Transfers Overall transfer level: Needs assistance Transfers: Sit to/from Stand Sit to Stand: Min assist General transfer comment: max directional verbal and tactile cues to complete task Ambulation/Gait Ambulation/Gait assistance: Min assist Ambulation Distance (Feet): 150 Feet Assistive device: Rolling walker (2 wheeled);1 person hand held assist Gait Pattern/deviations: Step-through pattern;Decreased stride length;Shuffle Gait velocity: slow General Gait Details: pt requires assist x 1 whether patient uses RW or 1 person hand held assist, pt however with good walker management  around obstacles. pt remains mildly unsteady with/without AD requiring mina    Exercises     PT Diagnosis: Difficulty walking  PT Problem List: Decreased strength;Decreased activity tolerance;Decreased balance;Decreased mobility PT Treatment Interventions: DME instruction;Gait training;Functional mobility training;Therapeutic activities;Therapeutic exercise;Balance training;Neuromuscular re-education     PT Goals(Current goals can be found in the care plan section) Acute Rehab PT Goals Patient Stated Goal: didn't state PT Goal Formulation: With patient/family Time For Goal Achievement: 02/01/14 Potential to Achieve Goals: Good  Visit Information  Last PT Received On: 01/25/14 Assistance Needed: +1 History of Present Illness: Pt s/p R craniotomy for excision of chronic R subdural hematoma.       Prior [place] expects to be discharged to:: Private residence Living Arrangements: Other relatives (pt plans on returning to sister-in-law's home) Available Help at Discharge: Family;Available 24 hours/day Type of Home: House Home Access: Level entry Home Layout: One level Home Equipment: None Additional Comments: pt was living independently with family checking daily to assist in medicine management.  Prior Function Level of Independence: Needs assistance Gait / Transfers Assistance Needed: pt does not use device ADL's / Homemaking Assistance Needed: family manages medications Comments: pt does not drive Communication Communication: No difficulties Dominant Hand: Right    Cognition  Cognition Arousal/Alertness: Lethargic Behavior During Therapy: Flat affect Overall Cognitive Status: History of cognitive impairments - at baseline (pt with baseline dementia, pt not aware of date or president at baseline) Memory: Decreased short-term memory    Extremity/Trunk Assessment Upper Extremity Assessment Upper Extremity Assessment: LUE  deficits/detail LUE Deficits / Details: grossly 4/5 Lower Extremity Assessment Lower Extremity Assessment: LLE deficits/detail LLE Deficits / Details: grossly 4/5 Cervical /  Trunk Assessment Cervical / Trunk Assessment: Normal   Balance Balance Overall balance assessment: Needs assistance Sitting-balance support: Feet supported;No upper extremity supported Sitting balance-Leahy Scale: Fair Standing balance support: Single extremity supported Standing balance-Leahy Scale: Poor  End of Session PT - End of Session Equipment Utilized During Treatment: Gait belt Activity Tolerance: Patient tolerated treatment well Patient left:  (in bathroom with RN) Nurse Communication: Mobility status  GP     Kingsley Callander 01/25/2014, 9:20 AM   Kittie Plater, PT, DPT Pager #: [phone] Office #: [phone]

## 2014-01-25 NOTE — Progress Notes (Signed)
Patient ID: Taylor Andrews, female   DOB: December 13, 1933, 80 y.o.   MRN: [id number] Subjective: Patient reports no headache. Denies numbness or tingling or weakness.  Objective: Vital signs in last 24 hours: Temp:  [97.8 F (36.6 C)-100.5 F (38.1 C)] 98.8 F (37.1 C) (03/23 0800) Pulse Rate:  [81-99] 98 (03/23 0800) Resp:  [14-23] 18 (03/23 0800) BP: (99-141)/(36-99) 99/78 mmHg (03/23 0800) SpO2:  [93 %-100 %] 100 % (03/23 0800) Weight:  [56.2 kg (123 lb 14.4 oz)] 56.2 kg (123 lb 14.4 oz) (03/23 0300)  Intake/Output from previous day: 03/22 0701 - 03/23 0700 In: 960 [P.O.:860; I.V.:100] Out: 220 [Urine:220] Intake/Output this shift:    He is awake and alert and pleasant and conversant. She knows her name and place. Has no pronator drift. She moves all extremities equally and strongly.  Lab Results: Lab Results  Component Value Date   WBC 4.6 01/22/2014   HGB 14.6 01/22/2014   HCT 44.0 01/22/2014   MCV 90.7 01/22/2014   PLT 268 01/22/2014   Lab Results  Component Value Date   INR 0.93 01/22/2014   BMET Lab Results  Component Value Date   NA 144 01/22/2014   K 3.4* 01/22/2014   CL 100 01/22/2014   CO2 34* 01/22/2014   GLUCOSE 72 01/22/2014   BUN 11 01/22/2014   CREATININE 0.68 01/22/2014   CALCIUM 10.0 01/22/2014    Studies/Results: Ct Head Wo Contrast  01/24/2014   CLINICAL DATA:  Followup subdural hematoma  EXAM: CT HEAD WITHOUT CONTRAST  TECHNIQUE: Contiguous axial images were obtained from the base of the skull through the vertex without intravenous contrast.  COMPARISON:  Comparison 01/22/2014 and previous  FINDINGS: Subdural drain has been placed from the site of a right frontal craniotomy, lying within the right sided subdural collection. Much of the subdural fluid has been drained but perhaps 30% remains. There is subdural air in the nondependent portions. There is less mass effect with right-to-left shift of only 2 mm presently. No new bleeding is seen. No evidence of brain  parenchymal complication such as infarction or intraparenchymal hemorrhage.  IMPRESSION: Right-sided subdural drain placed with removal of the majority of the right-sided subdural fluid. Less mass effect with right-to-left shift reduced from 4 mm to 2 mm.   Electronically Signed   By: Nelson Chimes M.D.   On: 01/24/2014 08:07    Assessment/Plan: She is doing well. We'll transfer to the floor today. PT OT.   LOS: 3 days    JONES,DAVID S 01/25/2014, 9:30 AM

## 2014-01-25 NOTE — Evaluation (Signed)
Occupational Therapy Evaluation Patient Details Name: Taylor Andrews MRN: [id number] DOB: 07-Oct-1934 Today's Date: 01/25/2014 Time: 0109-3235 OT Time Calculation (min): 26 min  OT Assessment / Plan / Recommendation History of present illness Pt s/p R craniotomy for excision of chronic R subdural hematoma.   Clinical Impression   Pt presents with generalized weakness, lethargy, and impaired balance interfering with ability to perform ADL and ADL transfers.  She has baseline dementia, but was very functional prior to admission with family checking on her and assisting with transportation and medications. Pt will go home with her sister in law.  No family available to report bathroom set up.  Will follow to address ADL and determine DME needs upon discharge.    OT Assessment  Patient needs continued OT Services    Follow Up Recommendations  Home health OT;Supervision/Assistance - 24 hour    Barriers to Discharge      Equipment Recommendations   (will determine need for bathroom equipment)    Recommendations for Other Services    Frequency  Min 2X/week    Precautions / Restrictions Precautions Precautions: Fall Restrictions Weight Bearing Restrictions: No   Pertinent Vitals/Pain Denies pain, VSS    ADL  Eating/Feeding: Set up Where Assessed - Eating/Feeding: Chair Grooming: Wash/dry hands;Min guard Where Assessed - Grooming: Unsupported standing Upper Body Bathing: Supervision/safety Where Assessed - Upper Body Bathing: Unsupported sitting Lower Body Bathing: Minimal assistance Where Assessed - Lower Body Bathing: Unsupported sitting;Supported sit to stand Upper Body Dressing: Supervision/safety Where Assessed - Upper Body Dressing: Unsupported sitting Lower Body Dressing: Minimal assistance Where Assessed - Lower Body Dressing: Unsupported sitting;Supported sit to stand Toilet Transfer: Minimal assistance Toilet Transfer Method: Sit to stand Toilet Transfer Equipment:  Regular height toilet;Grab bars Toileting - Clothing Manipulation and Hygiene: Maximal assistance Where Assessed - Camera operator Manipulation and Hygiene: Sit on 3-in-1 or toilet Transfers/Ambulation Related to ADLs: min assist with hand held ADL Comments: Pt able to reach her feet to donn and doff socks in sitting, impaired balance interfering with ability to perform standing activities.    OT Diagnosis: Generalized weakness;Cognitive deficits  OT Problem List: Decreased strength;Decreased activity tolerance;Impaired balance (sitting and/or standing);Decreased coordination;Decreased cognition;Decreased safety awareness;Decreased knowledge of use of DME or AE OT Treatment Interventions: Self-care/ADL training;DME and/or AE instruction;Patient/family education;Balance training;Therapeutic activities   OT Goals(Current goals can be found in the care plan section) Acute Rehab OT Goals Patient Stated Goal: didn't state OT Goal Formulation: Patient unable to participate in goal setting Time For Goal Achievement: 02/08/14 Potential to Achieve Goals: Good ADL Goals Pt Will Perform Grooming: with supervision;standing Pt Will Perform Lower Body Bathing: with supervision;sit to/from stand Pt Will Perform Lower Body Dressing: with supervision;sit to/from stand Pt Will Transfer to Toilet: with supervision;ambulating;regular height toilet Pt Will Perform Toileting - Clothing Manipulation and hygiene: with supervision;sit to/from stand Pt Will Perform Tub/Shower Transfer: with supervision;ambulating (determine tub equipment needs)  Visit Information  Last OT Received On: 01/25/14 Assistance Needed: +1 History of Present Illness: Pt s/p R craniotomy for excision of chronic R subdural hematoma.       Prior [place] expects to be discharged to:: Private residence Living Arrangements: Other relatives Available Help at Discharge: Family;Available 24  hours/day Type of Home: House Home Access: Level entry Home Layout: One level Home Equipment: None Additional Comments: pt was living independently with family checking daily to assist in medicine management.  Prior Function Level of Independence: Needs assistance Gait /  Transfers Assistance Needed: pt does not use device ADL's / Homemaking Assistance Needed: family manages medications Comments: pt does not drive Communication Communication: No difficulties Dominant Hand: Right         Vision/Perception Vision - History Patient Visual Report: No change from baseline   Cognition  Cognition Arousal/Alertness: Lethargic Behavior During Therapy: Flat affect Overall Cognitive Status: History of cognitive impairments - at baseline Memory: Decreased short-term memory    Extremity/Trunk Assessment Upper Extremity Assessment Upper Extremity Assessment: Overall WFL for tasks assessed   Cervical / Trunk Assessment Cervical / Trunk Assessment: Normal     Mobility Bed Mobility Overal bed mobility:  (no assessed, pt on toilet upon OTs arrival)  Transfers Overall transfer level: Needs assistance Transfers: Sit to/from Stand Sit to Stand: Min assist     Exercise     Balance Balance Overall balance assessment: Needs assistance Sitting-balance support: Feet supported Sitting balance-Leahy Scale: Fair Standing balance support: During functional activity Standing balance-Leahy Scale: Poor   End of Session OT - End of Session Activity Tolerance: Patient limited by lethargy Patient left: in chair;with call bell/phone within reach;with nursing/sitter in room Nurse Communication:  (pt had BM)  GO     Malka So 01/25/2014, 9:45 AM [phone]

## 2014-01-26 ENCOUNTER — Inpatient Hospital Stay (HOSPITAL_COMMUNITY): Payer: Medicare Other

## 2014-01-26 ENCOUNTER — Encounter (HOSPITAL_COMMUNITY): Payer: Self-pay | Admitting: Neurological Surgery

## 2014-01-26 LAB — GLUCOSE, CAPILLARY
GLUCOSE-CAPILLARY: 82 mg/dL (ref 70–99)
Glucose-Capillary: 89 mg/dL (ref 70–99)
Glucose-Capillary: 78 mg/dL (ref 70–99)
Glucose-Capillary: 131 mg/dL — ABNORMAL HIGH (ref 70–99)

## 2014-01-26 NOTE — Progress Notes (Signed)
Physical Therapy Treatment Patient Details Name: Taylor Andrews MRN: [id number] DOB: 1934-05-25 Today's Date: 01/26/2014    History of Present Illness Pt s/p R craniotomy for excision of chronic R subdural hematoma.    PT Comments    Pt is progressing well towards functional goals. Pt was more alert on this date but functional activity continues to be limited to fatigue. Pt able to perform some higher level balance activities as described below. Pt to benefit from skilled acute PT to address limitation listed below.  Follow Up Recommendations  Home health PT;Supervision/Assistance - 24 hour     Equipment Recommendations  Rolling walker with 5" wheels    Recommendations for Other Services       Precautions / Restrictions Precautions Precautions: Fall Restrictions Weight Bearing Restrictions: No    Mobility  Bed Mobility Overal bed mobility: Needs Assistance Bed Mobility: Supine to Sit     Supine to sit: Min guard     General bed mobility comments: vwerbal cues for hand/leg placement and safety  Transfers Overall transfer level: Needs assistance Equipment used: Rolling walker (2 wheeled);None Transfers: Sit to/from Stand Sit to Stand: Min guard         General transfer comment: pt able to stand without UE support for 1 min prior to amb. verbal and tactle cues for hand placement and safety  Ambulation/Gait Ambulation/Gait assistance: Min assist Ambulation Distance (Feet): 100 Feet Assistive device: Rolling walker (2 wheeled);1 person hand held assist;None Gait Pattern/deviations: Step-through pattern;Decreased stride length;Shuffle Gait velocity: slow, guarded for falls   General Gait Details: pt's amb was limited to fatigue. pt reports slight dizziness upon initial amb but improved after a short standing break. pt able to amb with one hand held min A. pt able to perform side steps for 4 ft without RW or UE support. pt felt more stable amb with RW   Stairs            Wheelchair Mobility    Modified Rankin (Stroke Patients Only)       Balance Overall balance assessment: Needs assistance Sitting-balance support: Feet supported Sitting balance-Leahy Scale: Fair Sitting balance - Comments: pt req verbal cues not to stand right away.   Standing balance support: During functional activity (RW provided most support during functional activities ) Standing balance-Leahy Scale: Poor Standing balance comment: pt prefers to have something to hold on to for more stability but is able to stand without UE support. Pt reports feeling dizzy upon initial standing transfer req a 30 sec standing resting break priior to therapeutic activities             High level balance activites: Side stepping;Backward walking (tandem walking) High Level Balance Comments: pt able to walk backwards 3 ft without UE support. pt req min A after experiencing slight LOB with tandem walk. pt able to side step L and R 5 ft without UE support    Cognition Arousal/Alertness: Awake/alert Behavior During Therapy: Flat affect Overall Cognitive Status: History of cognitive impairments - at baseline       Memory: Decreased short-term memory              Exercises General Exercises - Lower Extremity Heel Raises: Both;10 reps;Strengthening;Standing Mini-Sqauts: 10 reps;Standing;Strengthening    General Comments  Pt requested to use the restroom. Pt req min guard for toilet transfers and supervision for hygiene post toilet use.      Pertinent Vitals/Pain Pt reports mild headache near site of incision and mild dizziness  with initial standing. Dizziness was controlled after a 30 sec rest break.     Home Living                      Prior Function            PT Goals (current goals can now be found in the care plan section) Acute Rehab PT Goals Patient Stated Goal: return home with neice  PT Goal Formulation: With patient Time For Goal Achievement:  02/01/14 Potential to Achieve Goals: Good Progress towards PT goals: Progressing toward goals    Frequency  Min 3X/week    PT Plan Frequency needs to be updated    End of Session Equipment Utilized During Treatment: Gait belt (RW) Activity Tolerance: Patient tolerated treatment well;Patient limited by fatigue Patient left: in bed;with call bell/phone within reach;with bed alarm set     Time: 0900-0930 PT Time Calculation (min): 30 min  Charges:                       G Codes:      Kohler,Andrew SPT 02-18-14, 10:56 AM  Agree with above assessment.  Kittie Plater, PT, DPT Pager #: [phone] Office #: [phone]

## 2014-01-26 NOTE — Progress Notes (Signed)
Patient ID: Taylor Andrews, female   DOB: 06-Jul-1934, 80 y.o.   MRN: [id number] Doin well this am. No headache. Moving well. Eating breakfast herself. Home soon?

## 2014-01-26 NOTE — Progress Notes (Signed)
Report received from 3MW at 1732 and pt arrived on unit at 1755 via wheelchair with family and belongings at side. Pt denies any pain, staples to head remain dry and intact. IV's to right hand remain intact and saline locked. Pt oriented to unit, call light within reach. Will continue to monitor quietly. Delia Heady, RN

## 2014-01-26 NOTE — Progress Notes (Signed)
Physical Therapy Treatment Patient Details Name: Taylor Andrews MRN: [id number] DOB: 10-Aug-1934 Today's Date: 01/26/2014    History of Present Illness Pt s/p R craniotomy for excision of chronic R subdural hematoma.    PT Comments    Pt is progressing well towards functional goals. Pt was more alert on this date but functional activity continues to be limited to fatigue. Pt able to perform some higher level balance activities as described below. Pt to benefit from skilled acute PT to address limitation listed below.  Follow Up Recommendations  Home health PT;Supervision/Assistance - 24 hour     Equipment Recommendations  Rolling walker with 5" wheels    Recommendations for Other Services       Precautions / Restrictions Precautions Precautions: Fall Restrictions Weight Bearing Restrictions: No    Mobility  Bed Mobility Overal bed mobility: Needs Assistance Bed Mobility: Supine to Sit     Supine to sit: Min guard     General bed mobility comments: vwerbal cues for hand/leg placement and safety  Transfers Overall transfer level: Needs assistance Equipment used: Rolling walker (2 wheeled);None Transfers: Sit to/from Stand Sit to Stand: Min guard         General transfer comment: pt able to stand without UE support for 1 min prior to amb. verbal and tactle cues for hand placement and safety  Ambulation/Gait Ambulation/Gait assistance: Min assist Ambulation Distance (Feet): 100 Feet Assistive device: Rolling walker (2 wheeled);1 person hand held assist;None Gait Pattern/deviations: Step-through pattern;Decreased stride length;Shuffle Gait velocity: slow, guarded for falls   General Gait Details: pt's amb was limited to fatigue. pt reports slight dizziness upon initial amb but improved after a short standing break. pt able to amb with one hand held min A. pt able to perform side steps for 4 ft without RW or UE support. pt felt more stable amb with RW   Stairs            Wheelchair Mobility    Modified Rankin (Stroke Patients Only)       Balance Overall balance assessment: Needs assistance Sitting-balance support: Feet supported Sitting balance-Leahy Scale: Fair Sitting balance - Comments: pt req verbal cues not to stand right away.   Standing balance support: During functional activity (RW provided most support during functional activities ) Standing balance-Leahy Scale: Poor Standing balance comment: pt prefers to have something to hold on to for more stability but is able to stand without UE support. Pt reports feeling dizzy upon initial standing transfer req a 30 sec standing resting break priior to therapeutic activities             High level balance activites: Side stepping;Backward walking (tandem walking) High Level Balance Comments: pt able to walk backwards 3 ft without UE support. pt req min A after experiencing slight LOB with tandem walk. pt able to side step L and R 5 ft without UE support    Cognition Arousal/Alertness: Awake/alert Behavior During Therapy: Flat affect Overall Cognitive Status: History of cognitive impairments - at baseline       Memory: Decreased short-term memory              Exercises General Exercises - Lower Extremity Heel Raises: Both;10 reps;Strengthening;Standing Mini-Sqauts: 10 reps;Standing;Strengthening    General Comments  Pt requested to use the restroom. Pt req min guard for toilet transfers and supervision for hygiene post toilet use.      Pertinent Vitals/Pain Pt reports mild headache near site of incision and mild dizziness  with initial standing. Dizziness was controlled after a 30 sec rest break.     Home Living                      Prior Function            PT Goals (current goals can now be found in the care plan section) Acute Rehab PT Goals Patient Stated Goal: return home with neice  PT Goal Formulation: With patient Time For Goal Achievement:  02/01/14 Potential to Achieve Goals: Good Progress towards PT goals: Progressing toward goals    Frequency  Min 3X/week    PT Plan Frequency needs to be updated    End of Session Equipment Utilized During Treatment: Gait belt (RW) Activity Tolerance: Patient tolerated treatment well;Patient limited by fatigue Patient left: in bed;with call bell/phone within reach;with bed alarm set     Time: 0900-0930 PT Time Calculation (min): 30 min  Charges:                       G Codes:      Brileigh Sevcik SPT 02-16-14, 10:56 AM

## 2014-01-27 LAB — GLUCOSE, CAPILLARY
GLUCOSE-CAPILLARY: 104 mg/dL — AB (ref 70–99)
GLUCOSE-CAPILLARY: 112 mg/dL — AB (ref 70–99)
GLUCOSE-CAPILLARY: 104 mg/dL — AB (ref 70–99)
Glucose-Capillary: 139 mg/dL — ABNORMAL HIGH (ref 70–99)

## 2014-01-27 NOTE — Progress Notes (Signed)
Occupational Therapy Treatment Patient Details Name: Taylor Andrews MRN: [id number] DOB: 02/23/1934 Today's Date: 01/27/2014    History of present illness Pt s/p R craniotomy for excision of chronic R subdural hematoma.   OT comments  Education provided to pt/family during session. Recommended someone be with pt 24/7 at home.   Follow Up Recommendations  Home health OT;Supervision/Assistance - 24 hour    Equipment Recommendations  None recommended by OT    Recommendations for Other Services      Precautions / Restrictions Precautions Precautions: Fall Restrictions Weight Bearing Restrictions: No       Mobility Bed Mobility Overal bed mobility: Needs Assistance Bed Mobility: Supine to Sit;Sit to Supine     Supine to sit: Modified independent (Device/Increase time) Sit to supine: Supervision      Transfers Overall transfer level: Needs assistance Equipment used: Rolling walker (2 wheeled);None Transfers: Sit to/from Stand Sit to Stand: Min guard         General transfer comment: cues for technique.    Balance                                   ADL   Grooming: Oral care;Wash/dry hands;Set up;Supervision/safety;Standing       Lower Body Dressing: Min guard;Sit to/from stand Toilet Transfer: Min guard;Ambulation;Comfort height toilet Toileting- Clothing Manipulation and Hygiene: Min guard;Sit to/from stand;Sitting/lateral lean Tub/ Shower Transfer: Minimal assistance (practiced stepping over) Functional mobility during ADLs: Min guard;Rolling walker (also ambulated a little without walker) General ADL Comments: Educated on tub transfer technique with pt and family. Pt practiced stepping over-also educated family on technique of backing to chair in tub and swinging legs in. Recommended family pick up rugs in house if there are any.       Vision                     Perception     Praxis      Cognition   Behavior During Therapy:  [facility] for tasks assessed/performed Overall Cognitive Status: History of cognitive impairments - at baseline                       Extremity/Trunk Assessment               Exercises       General Comments      Pertinent Vitals/ Pain      No pain reported.   Home Living                                          Prior Functioning/Environment              Frequency Min 2X/week     Progress Toward Goals  OT Goals(current goals can now be found in the care plan section)  Progress towards OT goals: Progressing toward goals  Acute Rehab OT Goals Patient Stated Goal: not stated OT Goal Formulation: Patient unable to participate in goal setting Time For Goal Achievement: 02/08/14 Potential to Achieve Goals: Good ADL Goals Pt Will Perform Grooming: with supervision;standing Pt Will Perform Lower Body Bathing: with supervision;sit to/from stand Pt Will Perform Lower Body Dressing: with supervision;sit to/from stand Pt Will Transfer to Toilet: with supervision;ambulating;regular height toilet Pt Will Perform Toileting - Clothing Manipulation and hygiene:  with supervision;sit to/from stand Pt Will Perform Tub/Shower Transfer: with supervision;ambulating (determine tub equipment needs)  Plan Discharge plan remains appropriate    End of Session Equipment Utilized During Treatment: Gait belt;Rolling walker  Activity Tolerance Patient tolerated treatment well   Patient Left in bed;with call bell/phone within reach;with family/visitor present   Nurse Communication          Time: 5427-0623 OT Time Calculation (min): 21 min  Charges: OT General Charges $OT Visit: 1 Procedure OT Treatments $Self Care/Home Management : 8-22 mins  Benito Mccreedy OTR/L [phone] 01/27/2014, 4:13 PM

## 2014-01-27 NOTE — Progress Notes (Signed)
Patient ID: Taylor Andrews, female   DOB: 15-Apr-1934, 80 y.o.   MRN: [id number] Patient really looks good this morning. Her incision looks good. She is awake and pleasant and conversant. She is oriented to person place and situation. She moves all extremities well. Continue physical and occupational therapy and once she is strong enough to become safe at home she will be eligible for discharge.

## 2014-01-27 NOTE — Care Management Note (Unsigned)
    Page 1 of 2   01/28/2014     11:05:07 AM   CARE MANAGEMENT NOTE 01/28/2014  Patient:  Taylor, Andrews   Account Number:  [id number]  Date Initiated:  01/26/2014  Documentation initiated by:  Sandi Mariscal  Subjective/Objective Assessment:   R craniotomy for excision of chronic R subdural hematoma     Action/Plan:   therapies recommending home w/ [facility]   Anticipated DC Date:  01/29/2014   Anticipated DC Plan:  [place]  CM consult      Choice offered to / List presented to:  C-1 Patient        [place] arranged  [place] [facility]      [place].   Status of service:  Completed, signed off Medicare Important Message given?   (If response is "NO", the following Medicare IM given date fields will be blank) Date Medicare IM given:   Date Additional Medicare IM given:    Discharge Disposition:    Per UR Regulation:  Reviewed for med. necessity/level of care/duration of stay  If discussed at [place] of Stay Meetings, dates discussed:    Comments:  01/28/14 [place], MSN, CM- Update.  Spoke with patient's neice Taylor Andrews, who states that patient's discharge location has changed.  Patient will now be discharging home with Taylor Andrews. Contact numbers remain the same. Mary with [facility] was notified of update.  New discharge address:  [address] [place], [place]    01/27/14 [place], MSN, CM- Met with patient and family to discuss home health needs for discharge. Patient is agreeable to home health and has chosen [facility].  Marie with [facility] was notified and has accepted the referral.  Patient will be discharging home to [place]  #1:   [phone] Taylor Andrews (neice) Contact #2:  [phone]  Taylor Andrews (neice)

## 2014-01-28 LAB — GLUCOSE, CAPILLARY
Glucose-Capillary: 105 mg/dL — ABNORMAL HIGH (ref 70–99)
Glucose-Capillary: 110 mg/dL — ABNORMAL HIGH (ref 70–99)

## 2014-01-28 NOTE — Progress Notes (Addendum)
Pt discharge education and instructions completed with pt and family at bedside. All voices understanding and denies any questions. Pt IV removed, staples remains dry and intact with no s/s of infection noticed. Pt transported off unit via wheelchair with family and belongings at side. Pt had no more bloody mucous discharge from rectum witnessed and pt along with family denies pt having anymore. Pt discharge to home with home health. Francis Gaines Fayth Trefry RN.

## 2014-01-28 NOTE — Discharge Summary (Signed)
Physician Discharge Summary  Patient ID: Taylor Andrews MRN: [id number] DOB/AGE: [id number] y.o.  Admit date: 01/22/2014 Discharge date: 01/28/2014  Admission Diagnoses: R SDH    Discharge Diagnoses: same   Discharged Condition: good  Hospital Course: The patient was admitted on 01/22/2014 and taken to the operating room where the patient underwent R crani for SDH. The patient tolerated the procedure well and was taken to the recovery room and then to the ICU in stable condition. The hospital course was routine. There were no complications. The wound remained clean dry and intact. She has no headache. No complaints of new N/T/W. Follow-up head CT looked good. The patient remained afebrile with stable vital signs, and tolerated a regular diet. The patient continued to increase activities, and pain was well controlled with oral pain medications.   Consults: None  Significant Diagnostic Studies:  Results for orders placed during the hospital encounter of 01/22/14  MRSA PCR SCREENING      Result Value Ref Range   MRSA by PCR NEGATIVE  NEGATIVE  CBC WITH DIFFERENTIAL      Result Value Ref Range   WBC 4.6  4.0 - 10.5 K/uL   RBC 4.85  3.87 - 5.11 MIL/uL   Hemoglobin 14.6  12.0 - 15.0 g/dL   HCT 44.0  36.0 - 46.0 %   MCV 90.7  78.0 - 100.0 fL   MCH 30.1  26.0 - 34.0 pg   MCHC 33.2  30.0 - 36.0 g/dL   RDW 14.0  11.5 - 15.5 %   Platelets 268  150 - 400 K/uL   Neutrophils Relative % 53  43 - 77 %   Neutro Abs 2.5  1.7 - 7.7 K/uL   Lymphocytes Relative 32  12 - 46 %   Lymphs Abs 1.5  0.7 - 4.0 K/uL   Monocytes Relative 13 (*) 3 - 12 %   Monocytes Absolute 0.6  0.1 - 1.0 K/uL   Eosinophils Relative 1  0 - 5 %   Eosinophils Absolute 0.1  0.0 - 0.7 K/uL   Basophils Relative 1  0 - 1 %   Basophils Absolute 0.1  0.0 - 0.1 K/uL  COMPREHENSIVE METABOLIC PANEL      Result Value Ref Range   Sodium 144  137 - 147 mEq/L   Potassium 3.4 (*) 3.7 - 5.3 mEq/L   Chloride 100  96 - 112 mEq/L    CO2 34 (*) 19 - 32 mEq/L   Glucose, Bld 72  70 - 99 mg/dL   BUN 11  6 - 23 mg/dL   Creatinine, Ser 0.68  0.50 - 1.10 mg/dL   Calcium 10.0  8.4 - 10.5 mg/dL   Total Protein 7.9  6.0 - 8.3 g/dL   Albumin 3.9  3.5 - 5.2 g/dL   AST 17  0 - 37 U/L   ALT 9  0 - 35 U/L   Alkaline Phosphatase 79  39 - 117 U/L   Total Bilirubin 0.5  0.3 - 1.2 mg/dL   GFR calc non Af Amer 82 (*) >90 mL/min   GFR calc Af Amer >90  >90 mL/min  URINALYSIS, ROUTINE W REFLEX MICROSCOPIC      Result Value Ref Range   Color, Urine YELLOW  YELLOW   APPearance CLEAR  CLEAR   Specific Gravity, Urine 1.010  1.005 - 1.030   pH 6.5  5.0 - 8.0   Glucose, UA NEGATIVE  NEGATIVE mg/dL   Hgb urine dipstick  NEGATIVE  NEGATIVE   Bilirubin Urine NEGATIVE  NEGATIVE   Ketones, ur NEGATIVE  NEGATIVE mg/dL   Protein, ur NEGATIVE  NEGATIVE mg/dL   Urobilinogen, UA 0.2  0.0 - 1.0 mg/dL   Nitrite NEGATIVE  NEGATIVE   Leukocytes, UA TRACE (*) NEGATIVE  URINE MICROSCOPIC-ADD ON      Result Value Ref Range   Squamous Epithelial / LPF RARE  RARE   WBC, UA 3-6  <3 WBC/hpf   Bacteria, UA RARE  RARE  PROTIME-INR      Result Value Ref Range   Prothrombin Time 12.3  11.6 - 15.2 seconds   INR 0.93  0.00 - 1.49  GLUCOSE, CAPILLARY      Result Value Ref Range   Glucose-Capillary 90  70 - 99 mg/dL  GLUCOSE, CAPILLARY      Result Value Ref Range   Glucose-Capillary 88  70 - 99 mg/dL   Comment 1 Notify RN     Comment 2 Documented in Chart    GLUCOSE, CAPILLARY      Result Value Ref Range   Glucose-Capillary 125 (*) 70 - 99 mg/dL   Comment 1 Documented in Chart     Comment 2 Notify RN    GLUCOSE, CAPILLARY      Result Value Ref Range   Glucose-Capillary 132 (*) 70 - 99 mg/dL   Comment 1 Notify RN     Comment 2 Documented in Chart    GLUCOSE, CAPILLARY      Result Value Ref Range   Glucose-Capillary 126 (*) 70 - 99 mg/dL   Comment 1 Notify RN    GLUCOSE, CAPILLARY      Result Value Ref Range   Glucose-Capillary 122 (*) 70 - 99  mg/dL  GLUCOSE, CAPILLARY      Result Value Ref Range   Glucose-Capillary 122 (*) 70 - 99 mg/dL  GLUCOSE, CAPILLARY      Result Value Ref Range   Glucose-Capillary 137 (*) 70 - 99 mg/dL  GLUCOSE, CAPILLARY      Result Value Ref Range   Glucose-Capillary 120 (*) 70 - 99 mg/dL   Comment 1 Notify RN    GLUCOSE, CAPILLARY      Result Value Ref Range   Glucose-Capillary 124 (*) 70 - 99 mg/dL  GLUCOSE, CAPILLARY      Result Value Ref Range   Glucose-Capillary 109 (*) 70 - 99 mg/dL  GLUCOSE, CAPILLARY      Result Value Ref Range   Glucose-Capillary 90  70 - 99 mg/dL  GLUCOSE, CAPILLARY      Result Value Ref Range   Glucose-Capillary 81  70 - 99 mg/dL  GLUCOSE, CAPILLARY      Result Value Ref Range   Glucose-Capillary 89  70 - 99 mg/dL  GLUCOSE, CAPILLARY      Result Value Ref Range   Glucose-Capillary 78  70 - 99 mg/dL  GLUCOSE, CAPILLARY      Result Value Ref Range   Glucose-Capillary 82  70 - 99 mg/dL   Comment 1 Notify RN     Comment 2 Documented in Chart    GLUCOSE, CAPILLARY      Result Value Ref Range   Glucose-Capillary 131 (*) 70 - 99 mg/dL  GLUCOSE, CAPILLARY      Result Value Ref Range   Glucose-Capillary 104 (*) 70 - 99 mg/dL  GLUCOSE, CAPILLARY      Result Value Ref Range   Glucose-Capillary 112 (*) 70 - 99 mg/dL  Comment 1 Documented in Chart     Comment 2 Notify RN    GLUCOSE, CAPILLARY      Result Value Ref Range   Glucose-Capillary 139 (*) 70 - 99 mg/dL   Comment 1 Documented in Chart     Comment 2 Notify RN    GLUCOSE, CAPILLARY      Result Value Ref Range   Glucose-Capillary 104 (*) 70 - 99 mg/dL   Comment 1 Documented in Chart     Comment 2 Notify RN    GLUCOSE, CAPILLARY      Result Value Ref Range   Glucose-Capillary 105 (*) 70 - 99 mg/dL   Comment 1 Documented in Chart     Comment 2 Notify RN    GLUCOSE, CAPILLARY      Result Value Ref Range   Glucose-Capillary 110 (*) 70 - 99 mg/dL   Comment 1 Documented in Chart     Comment 2 Notify RN       Dg Chest 2 View  01/22/2014   CLINICAL DATA:  Weakness.  EXAM: CHEST  2 VIEW  COMPARISON:  July 14, 2013.  FINDINGS: The heart size and mediastinal contours are within normal limits. Both lungs are clear. No pneumothorax or pleural effusion is noted. The visualized skeletal structures are unremarkable.  IMPRESSION: No acute cardiopulmonary abnormality seen.   Electronically Signed   By: Sabino Dick M.D.   On: 01/22/2014 14:44   Ct Head Wo Contrast  01/26/2014   CLINICAL DATA:  Re-evaluate subdural hematoma.  EXAM: CT HEAD WITHOUT CONTRAST  TECHNIQUE: Contiguous axial images were obtained from the base of the skull through the vertex without contrast.  COMPARISON:  CT head 01/24/2014.  CT head 01/22/14.  FINDINGS: The subdural drain has been removed. There is residual hypoattenuating right convexity extra-axial fluid measuring 9 mm thickness. No significant compression of the underlying brain. Moderate pneumocephalus, but decreased from 03/22. No midline shift. Craniotomy defect unremarkable.  IMPRESSION: Overall improved appearance status post subdural drain removal. Residual hypoattenuating extra-axial fluid measuring up to 9 mm in thickness. Continued surveillance may be warranted. Decreasing pneumocephalus.   Electronically Signed   By: Rolla Flatten M.D.   On: 01/26/2014 11:18   Ct Head Wo Contrast  01/24/2014   CLINICAL DATA:  Followup subdural hematoma  EXAM: CT HEAD WITHOUT CONTRAST  TECHNIQUE: Contiguous axial images were obtained from the base of the skull through the vertex without intravenous contrast.  COMPARISON:  Comparison 01/22/2014 and previous  FINDINGS: Subdural drain has been placed from the site of a right frontal craniotomy, lying within the right sided subdural collection. Much of the subdural fluid has been drained but perhaps 30% remains. There is subdural air in the nondependent portions. There is less mass effect with right-to-left shift of only 2 mm presently. No new  bleeding is seen. No evidence of brain parenchymal complication such as infarction or intraparenchymal hemorrhage.  IMPRESSION: Right-sided subdural drain placed with removal of the majority of the right-sided subdural fluid. Less mass effect with right-to-left shift reduced from 4 mm to 2 mm.   Electronically Signed   By: Nelson Chimes M.D.   On: 01/24/2014 08:07   Ct Head Wo Contrast  01/22/2014   CLINICAL DATA:  Confusion for several months worse for 1 week, imbalance for 5 days, leg weakness, history colon cancer, hypertension, diabetes  EXAM: CT HEAD WITHOUT CONTRAST  TECHNIQUE: Contiguous axial images were obtained from the base of the skull through the vertex  without intravenous contrast.  COMPARISON:  01/31/2012  FINDINGS: Moderate to large right subdural collection in the frontoparietal region measuring up to 20 mm thick anteriorly.  This compresses the right hemisphere and the right lateral ventricle.  Mild right to left midline shift of 4 mm.  Observed subdural collection is predominantly low to intermediate attenuation suggesting this is subacute, though there is minimal high attenuation posteriorly which could represent a small amount of acute superimposed hemorrhage.  No intraparenchymal hemorrhage,, mass lesion or evidence of acute infarction.  No left-sided extra-axial collection.  Bones and sinuses unremarkable.  IMPRESSION: Moderate to large subdural hematoma in right frontoparietal region up to 20 mm thick with compression of the right hemisphere, right lateral ventricle, and 4 mm of right to left midline shift.  Intermediate attenuation of this collection suggests this is predominantly subacute though there is a small amount of higher attenuation posteriorly/dependently which may represent acute superimposed hemorrhage.  Critical Value/emergent results were called by telephone at the time of interpretation on 01/22/2014 at 3:05 PM to Dr. Milton Ferguson , who verbally acknowledged these results.    Electronically Signed   By: Lavonia Dana M.D.   On: 01/22/2014 15:06   Chest Port 1 View  01/23/2014   CLINICAL DATA:  Pre-op, subdural hematoma  EXAM: PORTABLE CHEST - 1 VIEW  COMPARISON:  DG CHEST 2 VIEW dated 01/22/2014  FINDINGS: The heart size and mediastinal contours are within normal limits. Both lungs are clear. The visualized skeletal structures are unremarkable.  IMPRESSION: No active disease.   Electronically Signed   By: Margaree Mackintosh M.D.   On: 01/23/2014 08:05    Antibiotics:  Anti-infectives   Start     Dose/Rate Route Frequency Ordered Stop   01/23/14 1245  ceFAZolin (ANCEF) IVPB 1 g/50 mL premix     1 g 100 mL/hr over 30 Minutes Intravenous Every 8 hours 01/23/14 1244 01/23/14 2053   01/23/14 1039  bacitracin 50,000 Units in sodium chloride irrigation 0.9 % 500 mL irrigation  Status:  Discontinued       As needed 01/23/14 1040 01/23/14 1134      Discharge Exam: Blood pressure 116/58, pulse 81, temperature 98.9 F (37.2 C), temperature source Oral, resp. rate 18, height 4' 9.5" (1.461 m), weight 56.2 kg (123 lb 14.4 oz), SpO2 96.00%. Neurologic: Grossly normal, MAE x4, awake alert, oriented Incision CDI  Discharge Medications:     Medication List    TAKE these medications       amLODipine 10 MG tablet  Commonly known as:  NORVASC  Take 10 mg by mouth daily.     ciprofloxacin 250 MG tablet  Commonly known as:  CIPRO  Take 1 tablet (250 mg total) by mouth 2 (two) times daily.     dicyclomine 10 MG capsule  Commonly known as:  BENTYL  Take 10 mg by mouth 2 (two) times daily as needed for spasms.     donepezil 10 MG tablet  Commonly known as:  ARICEPT  Take 10 mg by mouth at bedtime.     hydrochlorothiazide 25 MG tablet  Commonly known as:  HYDRODIURIL  Take 12.5 mg by mouth every morning.     metFORMIN 500 MG 24 hr tablet  Commonly known as:  GLUCOPHAGE-XR  Take 500 mg by mouth every morning.     NAMENDA 5 MG tablet  Generic drug:  memantine  Take 5  mg by mouth 2 (two) times daily.     niacin 500  MG CR tablet  Commonly known as:  NIASPAN  Take 500 mg by mouth daily.      ASK your doctor about these medications       nitrofurantoin (macrocrystal-monohydrate) 100 MG capsule  Commonly known as:  MACROBID  Take 100 mg by mouth 2 (two) times daily.        Disposition: home with [facility]   Final Dx: R craniotomy for SDH      Discharge Orders   Future Orders Complete By Expires   Call MD for:  difficulty breathing, headache or visual disturbances  As directed    Call MD for:  persistant nausea and vomiting  As directed    Call MD for:  redness, tenderness, or signs of infection (pain, swelling, redness, odor or green/yellow discharge around incision site)  As directed    Call MD for:  severe uncontrolled pain  As directed    Call MD for:  temperature >100.4  As directed    Diet - low sodium heart healthy  As directed    Discharge instructions  As directed    Comments:     No strenuous activity   Increase activity slowly  As directed       Follow-up Information   Follow up with JONES,DAVID S, MD. Schedule an appointment as soon as possible for a visit in 2 weeks.   Specialty:  Neurosurgery   Contact information:   [address] [place] [address] [phone]       Follow up with Eustace Moore, MD. Schedule an appointment as soon as possible for a visit in 2 weeks.   Specialty:  Neurosurgery   Contact information:   [address] [place] [address] [phone]        Signed: Eustace Moore 01/28/2014, 2:36 PM

## 2014-01-28 NOTE — Progress Notes (Signed)
Physical Therapy Treatment Patient Details Name: LOIDA CALAMIA MRN: [id number] DOB: [birth year] Today's Date: 01/28/2014    History of Present Illness Pt s/p R craniotomy for excision of chronic R subdural hematoma.    PT Comments    Patient progressing well. Educated on importance of using Rw and not getting up without assistance. Patient is hoping that she can go home today with assistance from family.   Follow Up Recommendations  Home health PT;Supervision/Assistance - 24 hour     Equipment Recommendations  Rolling walker with 5" wheels    Recommendations for Other Services       Precautions / Restrictions Precautions Precautions: Fall    Mobility  Bed Mobility Overal bed mobility: Modified Independent                Transfers       Sit to Stand: Supervision         General transfer comment: cues for technique. Supervision for safety  Ambulation/Gait Ambulation/Gait assistance: Min guard Ambulation Distance (Feet): 250 Feet Assistive device: Rolling walker (2 wheeled) Gait Pattern/deviations: Step-through pattern;Decreased stride length Gait velocity: slow, guarded for falls   General Gait Details: Better ambulation this sessoin. Cues for safety with RW and positioning of RW.    Stairs Stairs: Yes Stairs assistance: Min guard Stair Management: Step to pattern;Forwards;One rail Right Number of Stairs: 5 General stair comments: MinGuard for safety. Patient appears stedy and confident on stairs  Wheelchair Mobility    Modified Rankin (Stroke Patients Only)       Balance                                    Cognition Arousal/Alertness: Awake/alert Behavior During Therapy: WFL for tasks assessed/performed Overall Cognitive Status: History of cognitive impairments - at baseline       Memory: Decreased short-term memory              Exercises      General Comments        Pertinent Vitals/Pain Denied pain     Home Living                      Prior Function            PT Goals (current goals can now be found in the care plan section) Progress towards PT goals: Progressing toward goals    Frequency  Min 3X/week    PT Plan      End of Session Equipment Utilized During Treatment: Gait belt Activity Tolerance: Patient tolerated treatment well Patient left: in bed;with call bell/phone within reach     Time: 0300-9233 PT Time Calculation (min): 14 min  Charges:  $Gait Training: 8-22 mins                    G Codes:      Jacqualyn Posey 01/28/2014, 2:13 PM 01/28/2014 Jacqualyn Posey PTA [phone] pager [phone] office

## 2014-01-28 NOTE — Progress Notes (Signed)
0825: pt alarm went off and pt was found in the bathroom with drops of bloody mucous fluid dripping on the floor. Pt was assisted back to the bed. Bloody fluid was assessed to be coming from pt rectum; pt had dried old bloody stains in her gown and bed bads; pt rectum was cleaned and not active bleeding noted; pt abd assessed and pt denies any tenderness, pain, BS x4 active in all quads, vital signs taken WNL, pt cleaned up in bed, MD paged and awaiting call back from MD. Will continue to monitor quietly. Francis Gaines Lavanna Rog RN.

## 2014-02-08 ENCOUNTER — Ambulatory Visit (INDEPENDENT_AMBULATORY_CARE_PROVIDER_SITE_OTHER): Payer: Medicare Other | Admitting: Gastroenterology

## 2014-02-08 ENCOUNTER — Encounter (INDEPENDENT_AMBULATORY_CARE_PROVIDER_SITE_OTHER): Payer: Self-pay

## 2014-02-08 DIAGNOSIS — D649 Anemia, unspecified: Secondary | ICD-10-CM

## 2014-02-08 LAB — IFOBT (OCCULT BLOOD): IFOBT: NEGATIVE

## 2014-02-08 NOTE — Progress Notes (Signed)
Pt return IFOBT test and it was NEGATIVE. 

## 2014-02-24 ENCOUNTER — Other Ambulatory Visit: Payer: Self-pay | Admitting: Neurological Surgery

## 2014-02-24 DIAGNOSIS — I6200 Nontraumatic subdural hemorrhage, unspecified: Secondary | ICD-10-CM

## 2014-03-08 ENCOUNTER — Ambulatory Visit
Admission: RE | Admit: 2014-03-08 | Discharge: 2014-03-08 | Disposition: A | Discharge status: Home / Self Care | Payer: Medicare Other | Source: Ambulatory Visit | Attending: Neurological Surgery | Admitting: Neurological Surgery

## 2014-03-08 DIAGNOSIS — I6200 Nontraumatic subdural hemorrhage, unspecified: Secondary | ICD-10-CM

## 2014-03-12 ENCOUNTER — Telehealth: Payer: Self-pay | Admitting: Internal Medicine

## 2014-03-12 NOTE — Telephone Encounter (Signed)
Patient's PCP said it was time for her to be scheduled for a colonoscopy. Her niece Veneda Melter) is the one to contact because the patient will get confused. Please call either [phone] or [phone]

## 2014-03-16 ENCOUNTER — Encounter: Payer: Self-pay | Admitting: *Deleted

## 2014-03-16 NOTE — Telephone Encounter (Signed)
I called Taylor Andrews. She just said that Dr. Gerarda Fraction said it looked like it was time for pt to have her colonoscopy. I told her that according to our records she is due in 01/2016.  Last seen by Neil Crouch, PA in 12/2013.   Please advise!

## 2014-03-16 NOTE — Telephone Encounter (Signed)
Agree, she is due 01/2016. Recent normal Hgb and negative ifobt.   Unless PCP has specific concerns, she is not due.

## 2014-03-17 NOTE — Telephone Encounter (Signed)
Taylor Andrews is aware and she will call of pt has issues before then.

## 2014-04-21 ENCOUNTER — Encounter: Payer: Self-pay | Admitting: Gastroenterology

## 2014-04-21 ENCOUNTER — Ambulatory Visit (INDEPENDENT_AMBULATORY_CARE_PROVIDER_SITE_OTHER): Payer: Medicare Other | Admitting: Gastroenterology

## 2014-04-21 VITALS — BP 112/61 | HR 74 | Temp 97.2°F | Resp 18 | Ht <= 58 in | Wt 127.2 lb

## 2014-04-21 DIAGNOSIS — Z86010 Personal history of colonic polyps: Secondary | ICD-10-CM

## 2014-04-21 DIAGNOSIS — R1030 Lower abdominal pain, unspecified: Secondary | ICD-10-CM

## 2014-04-21 DIAGNOSIS — R109 Unspecified abdominal pain: Secondary | ICD-10-CM

## 2014-04-21 NOTE — Assessment & Plan Note (Signed)
80 year old lady with history of mild dementia with previous chronic intermittent lower abdominal pain which is been extensively evaluated in the past. Please see my last office note in February. Patient no longer has abdominal pain. Niece reports that she's been doing very well since she's been at the assisted-living home for a couple months. No evidence of anemia and other with recent labs unremarkable. Last colonoscopy 3 years ago, due in the spring of 2017 for history of polyps. There has been question of whether or not she should have a colonoscopy at this point per PCP. No clear indication, will address for Dr. Gala Romney. Will repeat another I. FOBT as well. Discussed at length with patient's niece and patient and they both agree with plan.

## 2014-04-21 NOTE — Progress Notes (Signed)
Primary Care Physician: Glo Herring., MD  Primary Gastroenterologist:  Garfield Cornea, MD   Chief Complaint  Patient presents with  . Colon Cancer Screening  . Abdominal Pain    HPI: Taylor ALGUIRE is a 80 y.o. female here at the request of Dr. Baron Sane and her consideration of colonoscopy and evaluation of abdominal pain. We last saw her in February of this year for abdominal pain. She was found to have a UTI at that time. History of advanced colon polyps with high-grade dysplasia. Her last colonoscopy was in March 2012 and her repeat colonoscopy is due in March 2017. Since her last office visit she underwent a right craniotomy for evacuation of a chronic subdural hematoma with placement of a subdural drain. Niece is present with her today, [name]. She states patient is doing very well. Currently residing at an assisted living home.  Patient denies any ongoing abdominal pain. According to the niece she has not complained of it and couple months. Patient denies constipation, diarrhea, melena, rectal bleeding, vomiting, heartburn, dysphagia.No blood in stool or iron. ifobt negative in April.   Current Outpatient Prescriptions  Medication Sig Dispense Refill  . amLODipine (NORVASC) 10 MG tablet Take 10 mg by mouth daily.       Marland Kitchen dicyclomine (BENTYL) 10 MG capsule Take 10 mg by mouth 2 (two) times daily as needed for spasms.       Marland Kitchen donepezil (ARICEPT) 10 MG tablet Take 10 mg by mouth at bedtime.      . hydrochlorothiazide 25 MG tablet Take 12.5 mg by mouth every morning.       . metFORMIN (GLUCOPHAGE-XR) 500 MG 24 hr tablet Take 500 mg by mouth every morning.       Marland Kitchen NAMENDA 5 MG tablet Take 5 mg by mouth 2 (two) times daily.       . niacin (NIASPAN) 500 MG CR tablet Take 500 mg by mouth daily.       . Omega-3 Fatty Acids (FISH OIL) 1000 MG CAPS Take by mouth daily.       No current facility-administered medications for this visit.    Allergies as of 04/21/2014 - Review Complete  04/21/2014  Allergen Reaction Noted  . Iohexol  03/08/2008  . Milk-related compounds  06/02/2009   Past Medical History  Diagnosis Date  . Diabetes mellitus   . Hypertension   . Hyperlipidemia   . Fatty liver     on [place] normal LFT'S  . Iron deficiency     Chronic current parameter normal; 4/09 iron was 12 sat 5%  . GERD (gastroesophageal reflux disease)   . Adenomatous colon polyp     carcinoma in situ   Past Surgical History  Procedure Laterality Date  . Colonoscopy  8/05    internal hemorrhoids,polyp bengin  . Esophagogastroduodenoscopy  6/05    tiny antral erosin with some deformity of pyloric channel  . Vesicovaginal fistula closure w/ tah    . Ankle surgery      Left ;pins  . Colonoscopy  01/12/2011    JJK:KXFGHW rectum/ 5-mm polyp at the splenic flexure s/p polypectomy, tattooing, and scarred hepatic flexure, absolutely no evidence of any residual polyp tissue or neoplasm/Scattered left-sided diverticula. Tubular adenoma, no high-grade dysplasia.Repeat TCS due 01/2016  . Colonoscopy  06/16/2009    EXH:[id number] hemorrhoids, otherwise normal rectum.Flat polypoid lesion at the hepatic flexure, debulked with piecemeal snare polypectomy.  This area was also tattooed Transverse colon and descending colon  flat adenomatous appearing polyps resected with hot snare cautery as well. Tubulovillous adenomas  . Colonoscopy  December 2010    Dr. Gala Romney: Flat sessile hepatic flexure polyp status post saline-assisted piecemeal polypectomy. Fragments of adenomatous polyp with high-grade dysplasia.  . Colonoscopy  March 2011    Dr. Gala Romney: Minimal persisting polypoid tissue at site of previous piecemeal polypectomy status post hot snare polypectomy. Tubular adenoma, no high-grade dysplasia  . Abdominal hysterectomy    . Craniotomy Right 01/23/2014    Procedure: CRANIOTOMY HEMATOMA EVACUATION SUBDURAL;  Surgeon: Eustace Moore, MD;  Location: [facility];  Service: Neurosurgery;  Laterality:  Right;   Family History  Problem Relation Age of Onset  . Lupus Sister   . Other Sister     ? Liver cancer   History   Social History  . Marital Status: Divorced    Spouse Name: N/A    Number of Children: N/A  . Years of Education: N/A   Social History Main Topics  . Smoking status: Never Smoker   . Smokeless tobacco: None  . Alcohol Use: No  . Drug Use: No  . Sexual Activity: None   Other Topics Concern  . None   Social History Narrative  . None    ROS:  General: Negative for anorexia, weight loss, fever, chills, fatigue, weakness. ENT: Negative for hoarseness, difficulty swallowing , nasal congestion. CV: Negative for chest pain, angina, palpitations, dyspnea on exertion, peripheral edema.  Respiratory: Negative for dyspnea at rest, dyspnea on exertion, cough, sputum, wheezing.  GI: See history of present illness. GU:  Negative for dysuria, hematuria, urinary incontinence, urinary frequency, nocturnal urination.  Endo: Negative for unusual weight change.    Physical Examination:   BP 112/61  Pulse 74  Temp(Src) 97.2 F (36.2 C) (Oral)  Resp 18  Ht 4' 9.5" (1.461 m)  Wt 127 lb 3.2 oz (57.698 kg)  BMI 27.03 kg/m2  General: Well-nourished, well-developed in no acute distress.  Eyes: No icterus. Mouth: Oropharyngeal mucosa moist and pink , no lesions erythema or exudate. Lungs: Clear to auscultation bilaterally.  Heart: Regular rate and rhythm, no murmurs rubs or gallops.  Abdomen: Bowel sounds are normal, nontender, nondistended, no hepatosplenomegaly or masses, no abdominal bruits or hernia , no rebound or guarding.   Extremities: No lower extremity edema. No clubbing or deformities. Neuro: Alert and oriented x 4   Skin: Warm and dry, no jaundice.   Psych: Alert and cooperative, normal mood and affect.  Labs:  Labs from 03/11/2014 reviewed. Potassium 3.3, glucose 113, creatinine 0.71, total bilirubin 0.6, alkaline phosphatase 69, AST 14, ALT 8, albumin  4.4, white blood cell count 4800, hemoglobin 13.9, platelets 404,000, lipase 40, amylase 53.  Imaging Studies: No results found.

## 2014-04-21 NOTE — Patient Instructions (Signed)
1. Please collect stool specimen and return to our office. I will then discuss timing of next colonoscopy with Dr. Gala Romney based on results.

## 2014-04-21 NOTE — Progress Notes (Signed)
cc'd to pcp 

## 2014-04-28 ENCOUNTER — Other Ambulatory Visit (HOSPITAL_COMMUNITY): Payer: Self-pay | Admitting: Internal Medicine

## 2014-04-28 ENCOUNTER — Ambulatory Visit (INDEPENDENT_AMBULATORY_CARE_PROVIDER_SITE_OTHER): Payer: Medicare Other | Admitting: Gastroenterology

## 2014-04-28 DIAGNOSIS — Z1211 Encounter for screening for malignant neoplasm of colon: Secondary | ICD-10-CM

## 2014-04-28 DIAGNOSIS — Z1231 Encounter for screening mammogram for malignant neoplasm of breast: Secondary | ICD-10-CM

## 2014-04-28 LAB — IFOBT (OCCULT BLOOD): IMMUNOLOGICAL FECAL OCCULT BLOOD TEST: NEGATIVE

## 2014-04-28 NOTE — Progress Notes (Signed)
IFOBT Test Results (-) Negative

## 2014-04-28 NOTE — Progress Notes (Signed)
Quick Note:  Await Dr. Roseanne Kaufman input. ______

## 2014-05-10 ENCOUNTER — Telehealth: Payer: Self-pay | Admitting: Gastroenterology

## 2014-05-10 NOTE — Telephone Encounter (Signed)
Message copied by Mahala Menghini on Mon May 10, 2014 10:53 AM ------      Message from: Daneil Dolin      Created: Sun May 02, 2014  8:00 AM       yes      ----- Message -----         From: Mahala Menghini, PA-C         Sent: 04/28/2014   4:34 PM           To: Daneil Dolin, MD, Mahala Menghini, PA-C            80 year old lady with history of mild dementia with previous chronic intermittent lower abdominal pain which is been extensively evaluated in the past. Please see my last office note in February. Patient no longer has abdominal pain. Niece reports that she's been doing very well since she's been at the assisted-living home for a couple months.             No evidence of anemia and other with recent labs unremarkable. Last colonoscopy 3 years ago, due in the spring of 2017 for history of polyps. There has been question of whether or not she should have a colonoscopy at this point per PCP. No clear indication. ifobt negative.             Are you okay with waiting til 2017.       ------

## 2014-05-10 NOTE — Telephone Encounter (Signed)
Recent ifobt negative as well.  Please let family know (per Dr. Gala Romney) that patient can wait until spring 2017 for TCS for h/o colon polyps. Please make sure NIC is done.

## 2014-05-10 NOTE — Progress Notes (Signed)
Quick Note:  See separate telephone note. ______ 

## 2014-05-11 NOTE — Telephone Encounter (Signed)
Tried to call pt- LMOM 

## 2014-05-12 NOTE — Telephone Encounter (Signed)
Spoke with pts niece- informed her of recommendations and ifobt results.  Manuela Schwartz, please be sure to nic

## 2014-05-14 NOTE — Telephone Encounter (Signed)
Reminder in epic °

## 2014-06-07 ENCOUNTER — Ambulatory Visit (HOSPITAL_COMMUNITY)
Admission: RE | Admit: 2014-06-07 | Discharge: 2014-06-07 | Disposition: A | Discharge status: Home / Self Care | Payer: Medicare Other | Source: Ambulatory Visit | Attending: Internal Medicine | Admitting: Internal Medicine

## 2014-06-07 DIAGNOSIS — Z1231 Encounter for screening mammogram for malignant neoplasm of breast: Secondary | ICD-10-CM | POA: Insufficient documentation

## 2014-07-04 ENCOUNTER — Emergency Department (HOSPITAL_COMMUNITY)
Admission: EM | Admit: 2014-07-04 | Discharge: 2014-07-04 | Disposition: A | Discharge status: Home / Self Care | Payer: Medicare Other | Attending: Emergency Medicine | Admitting: Emergency Medicine

## 2014-07-04 ENCOUNTER — Emergency Department (HOSPITAL_COMMUNITY): Payer: Medicare Other

## 2014-07-04 ENCOUNTER — Encounter (HOSPITAL_COMMUNITY): Payer: Self-pay | Admitting: Emergency Medicine

## 2014-07-04 DIAGNOSIS — Z860100 Personal history of colon polyps, unspecified: Secondary | ICD-10-CM | POA: Insufficient documentation

## 2014-07-04 DIAGNOSIS — E785 Hyperlipidemia, unspecified: Secondary | ICD-10-CM | POA: Diagnosis not present

## 2014-07-04 DIAGNOSIS — F0390 Unspecified dementia without behavioral disturbance: Secondary | ICD-10-CM | POA: Diagnosis not present

## 2014-07-04 DIAGNOSIS — R109 Unspecified abdominal pain: Secondary | ICD-10-CM | POA: Insufficient documentation

## 2014-07-04 DIAGNOSIS — G8929 Other chronic pain: Secondary | ICD-10-CM | POA: Diagnosis not present

## 2014-07-04 DIAGNOSIS — I10 Essential (primary) hypertension: Secondary | ICD-10-CM | POA: Insufficient documentation

## 2014-07-04 DIAGNOSIS — I6203 Nontraumatic chronic subdural hemorrhage: Secondary | ICD-10-CM

## 2014-07-04 DIAGNOSIS — E119 Type 2 diabetes mellitus without complications: Secondary | ICD-10-CM | POA: Diagnosis not present

## 2014-07-04 DIAGNOSIS — L259 Unspecified contact dermatitis, unspecified cause: Secondary | ICD-10-CM | POA: Diagnosis not present

## 2014-07-04 DIAGNOSIS — Z79899 Other long term (current) drug therapy: Secondary | ICD-10-CM | POA: Diagnosis not present

## 2014-07-04 DIAGNOSIS — R42 Dizziness and giddiness: Secondary | ICD-10-CM | POA: Diagnosis not present

## 2014-07-04 DIAGNOSIS — I6200 Nontraumatic subdural hemorrhage, unspecified: Secondary | ICD-10-CM | POA: Insufficient documentation

## 2014-07-04 HISTORY — DX: Traumatic subdural hemorrhage with loss of consciousness of unspecified duration, initial encounter: S06.5X9A

## 2014-07-04 HISTORY — DX: Unspecified dementia, unspecified severity, without behavioral disturbance, psychotic disturbance, mood disturbance, and anxiety: F03.90

## 2014-07-04 HISTORY — DX: Unspecified abdominal pain: R10.9

## 2014-07-04 HISTORY — DX: Other chronic pain: G89.29

## 2014-07-04 LAB — URINALYSIS, ROUTINE W REFLEX MICROSCOPIC
BILIRUBIN URINE: NEGATIVE
GLUCOSE, UA: NEGATIVE mg/dL
Hgb urine dipstick: NEGATIVE
KETONES UR: NEGATIVE mg/dL
Nitrite: NEGATIVE
PH: 6.5 (ref 5.0–8.0)
Protein, ur: NEGATIVE mg/dL
SPECIFIC GRAVITY, URINE: 1.01 (ref 1.005–1.030)
Urobilinogen, UA: 0.2 mg/dL (ref 0.0–1.0)

## 2014-07-04 LAB — CBC WITH DIFFERENTIAL/PLATELET
BASOS PCT: 1 % (ref 0–1)
Basophils Absolute: 0 10*3/uL (ref 0.0–0.1)
EOS ABS: 0 10*3/uL (ref 0.0–0.7)
Eosinophils Relative: 1 % (ref 0–5)
HEMATOCRIT: 42.1 % (ref 36.0–46.0)
Hemoglobin: 13.7 g/dL (ref 12.0–15.0)
Lymphocytes Relative: 42 % (ref 12–46)
Lymphs Abs: 2 10*3/uL (ref 0.7–4.0)
MCH: 29.3 pg (ref 26.0–34.0)
MCHC: 32.5 g/dL (ref 30.0–36.0)
MCV: 90 fL (ref 78.0–100.0)
MONO ABS: 0.4 10*3/uL (ref 0.1–1.0)
Monocytes Relative: 9 % (ref 3–12)
Neutro Abs: 2.3 10*3/uL (ref 1.7–7.7)
Neutrophils Relative %: 47 % (ref 43–77)
Platelets: 298 10*3/uL (ref 150–400)
RBC: 4.68 MIL/uL (ref 3.87–5.11)
RDW: 13.6 % (ref 11.5–15.5)
WBC: 4.8 10*3/uL (ref 4.0–10.5)

## 2014-07-04 LAB — COMPREHENSIVE METABOLIC PANEL
ALBUMIN: 3.7 g/dL (ref 3.5–5.2)
ALT: 16 U/L (ref 0–35)
ANION GAP: 14 (ref 5–15)
AST: 17 U/L (ref 0–37)
Alkaline Phosphatase: 82 U/L (ref 39–117)
BUN: 11 mg/dL (ref 6–23)
CO2: 29 mEq/L (ref 19–32)
CREATININE: 0.74 mg/dL (ref 0.50–1.10)
Calcium: 9.8 mg/dL (ref 8.4–10.5)
Chloride: 100 mEq/L (ref 96–112)
GFR calc Af Amer: >90 mL/min (ref >90)
GFR calc non Af Amer: 80 mL/min — ABNORMAL LOW (ref >90)
Glucose, Bld: 78 mg/dL (ref 70–99)
Potassium: 3.6 mEq/L — ABNORMAL LOW (ref 3.7–5.3)
Sodium: 143 mEq/L (ref 137–147)
TOTAL PROTEIN: 7.2 g/dL (ref 6.0–8.3)
Total Bilirubin: 0.3 mg/dL (ref 0.3–1.2)

## 2014-07-04 LAB — LACTIC ACID, PLASMA: Lactic Acid, Venous: 2.3 mmol/L — ABNORMAL HIGH (ref 0.5–2.2)

## 2014-07-04 LAB — URINE MICROSCOPIC-ADD ON

## 2014-07-04 LAB — LIPASE, BLOOD: LIPASE: 71 U/L — AB (ref 11–59)

## 2014-07-04 MED ORDER — SODIUM CHLORIDE 0.9 % IJ SOLN
INTRAMUSCULAR | Status: AC
  Filled 2014-07-04: qty 30

## 2014-07-04 MED ORDER — SODIUM CHLORIDE 0.9 % IV SOLN: INTRAVENOUS | Status: DC

## 2014-07-04 NOTE — Discharge Instructions (Signed)
°Emergency Department Resource Guide °1) Find a Doctor and Pay Out of Pocket °Although you won't have to find out who is covered by your insurance plan, it is a good idea to ask around and get recommendations. You will then need to call the office and see if the doctor you have chosen will accept you as a new patient and what types of options they offer for patients who are self-pay. Some doctors offer discounts or will set up payment plans for their patients who do not have insurance, but you will need to ask so you aren't surprised when you get to your appointment. ° °2) Contact Your Local Health Department °Not all health departments have doctors that can see patients for sick visits, but many do, so it is worth a call to see if yours does. If you don't know where your local health department is, you can check in your phone book. The CDC also has a tool to help you locate your state's health department, and many state websites also have listings of all of their local health departments. ° °3) Find a Walk-in Clinic °If your illness is not likely to be very severe or complicated, you may want to try a walk in clinic. These are popping up all over the country in pharmacies, drugstores, and shopping centers. They're usually staffed by nurse practitioners or physician assistants that have been trained to treat common illnesses and complaints. They're usually fairly quick and inexpensive. However, if you have serious medical issues or chronic medical problems, these are probably not your best option. ° °No Primary Care Doctor: °- Call [facility] Connect at  832-8000 - they can help you locate a primary care doctor that  accepts your insurance, provides certain services, etc. °- Physician Referral Service- 1-800-533-3463 ° °Chronic Pain Problems: °Organization         Address  Phone   Notes  °[facility]  (336) 297-2271 Patients need to be referred by their primary care doctor.  ° °Medication  Assistance: °Organization         Address  Phone   Notes  °[place] [facility] [address] °[place], [place] [address] (336) 641-8030 --Must be a resident of [place] °-- Must have NO insurance coverage whatsoever (no Medicaid/ Medicare, etc.) °-- The pt. MUST have a primary care doctor that directs their care regularly and follows them in the community °  °[facility]  (866) 331-1348   °[employer]  (888) 892-1162   ° °Agencies that provide inexpensive medical care: °Organization         Address  Phone   Notes  °[facility]  (336) 832-8035   °[facility]    (336) 832-7272   °[facility] [address] °[place], [place] [address] (336) 832-4777   °[facility] [address], °[place] (336) 271-4999   °[facility]    (336) 373-0678   °[facility]    (336) 272-1050   °[facility] ° [address], [place] Phone:  (336) 832-4444, Fax:  (336) 832-4440 Hours of Operation:  9 am - 6 pm, M-F.  Also accepts Medicaid/Medicare and self-pay.  °[facility] ° [address], [place] Phone: (336) 832-3150, Fax: (336) 832-3151. Hours of Operation:  8:30 am - 5:30 pm, M-F.  Also accepts Medicaid and self-pay.  °[facility] [place] [address], [place] Phone: (336) 878-6027   °[facility] [address], [place], [place] (336)723-1848, Ext. 123 Mondays & Thursdays: 7-9 AM.  First 15 patients are seen on a first come, first serve basis. °  ° °Medicaid-accepting [place] Providers: ° °Organization         Address  Phone   Notes  °[facility] [address], [place] (336) 641-2100 Also accepts self-pay patients.  °[facility] [address], [place] ° (336) 856-9996   °[facility] [address], [place]  (336) 288-8857   °[facility] [address] [place] [address], [place] (336) 299-7000   °Veita Bland [address], [place]  ° (336) 373-1557 Only accepts [place] Access Medicaid patients after they have their name applied to their card.  ° °Self-Pay (no insurance) in [place]: ° °Organization         Address  Phone   Notes  °Sickle Cell Patients, [facility] [address], [place] (336) 832-1970   °[facility] [address], [place] (336) 832-4400   °[facility] [place] ° [address], [place] (336) 992-4800   °[facility]/Dr. Osei-Bonsu ° [address] [place] [address], [place] or [address], [place] (336) 841-8500 Phone number for both [place] and [place] locations is the same.  °[facility] [address], [place] (336) 299-0000   °[facility] [place] [address] [place] [address], [place] or [address] (336) 852-7530 °(336) 878-2260   °[facility] [address], [place] (336) 350-1642, phone; (336) 294-5005, fax Sees patients 1st and 3rd Saturday of every month.  Must not qualify for public or private insurance (i.e. Medicaid, Medicare, [place] Health Choice, Veterans' Benefits) • Household income should be no more than 200% of the poverty level •The clinic cannot treat you if you are pregnant or think you are pregnant • Sexually transmitted diseases are not treated at the clinic.  ° ° °Dental Care: °Organization         Address  Phone  Notes  °[place] [facility] [address], [place] (336) 641-6152 Accepts children up to age 21 who are enrolled in Medicaid or [place] Health Choice; pregnant women with a Medicaid card; and children who have applied for Medicaid or [place] Health Choice, but were declined, whose parents can pay a reduced fee at time of service.  °[place]  [facility] [place]  [address], [place] (336) 641-7733 Accepts children up to age 21 who are enrolled in Medicaid or [place] Health Choice; pregnant women with a Medicaid card; and children who have applied for Medicaid or [place] Health Choice, but were declined, whose parents can pay a reduced fee at time of service.  °[facility] ° [address],  (336) 641-4533 Patients are seen by appointment only. Walk-ins are not accepted. [facility] will see patients 18 years of age and older. °Monday - Tuesday (8am-5pm) °Most Wednesdays (8:30-5pm) °$30 per visit, cash only  °[facility] ° [address], [place] (336) 641-4533 Patients are seen by appointment only. Walk-ins are not accepted. [facility] will see patients 18 years of age and older. °One   Wednesday Evening (Monthly: Volunteer Based).  $30 per visit, cash only  °[facility]  (919) 537-3737 for adults; Children under age 4, call Graduate Pediatric Dentistry at (919) [phone]. Children aged 4-14, please call (919) [phone] to request a pediatric application. ° Dental services are provided in all areas of dental care including fillings, crowns and bridges, complete and partial dentures, implants, gum treatment, root canals, and extractions. Preventive care is also provided. Treatment is provided to both adults and children. °Patients are selected via a lottery and there is often a waiting list. °  °[facility] [address], °[place] ° (336) 763-8833 [email or web address] °  °[facility] [address], [place], [place] (336)723-1848, Ext. 123 Second and Fourth Thursday of each month, opens at 6:30 AM; Clinic ends at 9 AM.  Patients are seen on a first-come first-served basis, and a limited number are seen during each clinic.  ° °[facility] ° [address], [place], [place] (336) 723-7904    Eligibility Requirements °You must have lived in [place], [place], or [place] counties for at least the last three months. °  You cannot be eligible for state or federal sponsored healthcare insurance, including Veterans Administration, Medicaid, or Medicare. °  You generally cannot be eligible for healthcare insurance through your employer.  °  How to apply: °Eligibility screenings are held every Tuesday and Wednesday afternoon from 1:00 pm until 4:00 pm. You do not need an appointment for the interview!  °[facility] [address], [place], [place] 336-631-2330   °[facility]  336-342-8273   °[facility]  336-703-3100   °[facility]  336-570-6415   ° °Behavioral Health Resources in the Community: °Intensive Outpatient Programs °Organization         Address  Phone  Notes  °[facility] [address], [place], [place] 336-878-6098   °[facility] [address], [place], [place] 336-832-9800   °[facility] [address], [place], [place] ° 336-882-2125   °[facility] [address],  °[place], [place] 1-800-853-5163 or 336-641-4981   °Substance Abuse Resources °Organization         Address  Phone  Notes  °[facility]  336-882-2125   °[facility]  336-784-9470   °[facility]  336-285-9073   °[facility]  336-845-3988   °Residential & Outpatient Substance Abuse Program  1-800-659-3381   °Psychological Services °Organization         Address  Phone  Notes  °[facility]  336- 832-9600   °[facility]  336- 378-7881   °[facility] [address], [place] 1-800-853-5163 or 336-641-4981   ° °Mobile Crisis Teams °Organization         Address  Phone  Notes  °[facility], Mobile Crisis Care Unit  1-877-626-1772   °Assertive °[facility] ° [address]  [place], [place] 336-834-9664   °Sharon DeEsch [address] ° [place] 336-554-5454   ° °Self-Help/Support Groups °Organization         Address  Phone             Notes  °[facility]  - variety of support groups  336- 373-1402 Call for more information  °Narcotics Anonymous (NA), [facility] [address], °[place]  2 meetings at this location  ° °  Residential Treatment Programs Organization         Address  Phone  Notes  [facility] [address]    [place]  1-847-786-0361   [facility]  [address] [place] [address], [place], [place]   [place], [place] 304-202-4377 Admissions: 8am-3pm M-F  [facility] [address],    [place], [place] 740-814-4818   [facility] [address] [place], [place], [place]   [facility] [address]  [place], [place]   [facility] Intensive Outpatient [address], [place], [place]   [facility] ([address] [place]  [place], [place] 1-718-626-7216 or 606-455-3942   [facility] ([facility] [address] [place], [place] Accepts Medicaid  [facility] [address],  [place] 1-(669)152-5062 Substance Abuse/Addiction Treatment   [facility]         Address  Phone  Notes  [facility]  (636)080-8597   Domenic Schwab, PhD [address] [place], [place]   609-019-7364 or 641-642-5699   [facility] [place], [place] 573-843-5166   [facility] [address] [place], [place] (934)607-4375 Insurance/Medicaid/sponsorship through [facility] [address]                                    [place], [place] [phone] [place] [address] [place], [place] [phone]    Dr. Adele Schilder  231-851-6639   [facility] Dept. 1) [address] [place] 2) [place] 3)  [address], [place] 434-069-9235 [phone]  (778)[phone]   [place] 6043146475 or [phone] (After Hours)       Take your usual prescriptions as previously directed.  Take an over the counter non-sedating antihistamine such as claritin, allegra or zyrtec, as directed on packaging.  Call your regular medical doctor tomorrow to schedule a follow up appointment within the next 2 days. Call your regular GI doctor tomorrow to schedule a follow up appointment within the next week.  Return to the Emergency Department immediately sooner if worsening.

## 2014-07-04 NOTE — ED Notes (Addendum)
Pt reports lower abdominal pain since last night. Pt denies any n/v/d. LBM yesterday. Pt also reports intermittent dizziness since last week.  Pt reports dizziness with changing of positions today. Pt also reports broke out in a "rash around my eyes." that started today. Pt denies any black tarry stools/rectal bleeding. Pt alert and oriented. Speech clear. nad noted.

## 2014-07-04 NOTE — ED Provider Notes (Signed)
CSN: [id number]     Arrival date & time 07/04/14  1342 History   First MD Initiated Contact with Patient 07/04/14 1514     Chief Complaint  Patient presents with  . Abdominal Pain  . Dizziness      Patient is a 80 y.o. female presenting with abdominal pain and dizziness. The history is provided by a relative and the patient. The history is limited by the condition of the patient (Hx dementia).  Abdominal Pain Dizziness Pt was seen at 1520. Per pt and her family: c/o gradual onset and persistence of acute flair of her chronic abd "pain" for the past 2 weeks. Pt's family has been "giving her the pill Dr. Gala Romney gave [place]" for pain which "seems to help." Denies any change in her usual chronic pain pattern. Pt also states she woke up with "dry skin rash around my eyes." States she was "sitting outside on the porch yesterday" before her rash began. Pt also c/o feeling "lightheaded" today when she changes positions. Pt's family states they "want a CT of her head because she has had a bleed before." Pt currently denies any complaints. Denies fevers, no N/V/D, no back pain, no CP/SOB, no diffuse rash, no dysphagia, no stridor/wheezing, no black or blood in stools or emesis, no focal motor weakness, no tingling/numbness in extremities, no facial droop, no slurred speech, no syncope, no eye pain, no eye injury.    Past Medical History  Diagnosis Date  . Diabetes mellitus   . Hypertension   . Hyperlipidemia   . Fatty liver     on [place] normal LFT'S  . Iron deficiency     Chronic current parameter normal; 4/09 iron was 12 sat 5%  . GERD (gastroesophageal reflux disease)   . Adenomatous colon polyp     carcinoma in situ  . Dementia   . Chronic abdominal pain   . SDH (subdural hematoma) 01/2014    chronic   Past Surgical History  Procedure Laterality Date  . Colonoscopy  8/05    internal hemorrhoids,polyp bengin  . Esophagogastroduodenoscopy  6/05    tiny antral erosin with some deformity of pyloric  channel  . Vesicovaginal fistula closure w/ tah    . Ankle surgery      Left ;pins  . Colonoscopy  01/12/2011    WNI:OEVOJJ rectum/ 5-mm polyp at the splenic flexure s/p polypectomy, tattooing, and scarred hepatic flexure, absolutely no evidence of any residual polyp tissue or neoplasm/Scattered left-sided diverticula. Tubular adenoma, no high-grade dysplasia.Repeat TCS due 01/2016  . Colonoscopy  06/16/2009    KKX:FGHWEXHB hemorrhoids, otherwise normal rectum.Flat polypoid lesion at the hepatic flexure, debulked with piecemeal snare polypectomy.  This area was also tattooed Transverse colon and descending colon  flat adenomatous appearing polyps resected with hot snare cautery as well. Tubulovillous adenomas  . Colonoscopy  December 2010    Dr. Gala Romney: Flat sessile hepatic flexure polyp status post saline-assisted piecemeal polypectomy. Fragments of adenomatous polyp with high-grade dysplasia.  . Colonoscopy  March 2011    Dr. Gala Romney: Minimal persisting polypoid tissue at site of previous piecemeal polypectomy status post hot snare polypectomy. Tubular adenoma, no high-grade dysplasia  . Abdominal hysterectomy    . Craniotomy Right 01/23/2014    Procedure: CRANIOTOMY HEMATOMA EVACUATION SUBDURAL;  Surgeon: Eustace Moore, MD;  Location: [facility];  Service: Neurosurgery;  Laterality: Right;   Family History  Problem Relation Age of Onset  . Lupus Sister   . Other Sister     ?  Liver cancer   History  Substance Use Topics  . Smoking status: Never Smoker   . Smokeless tobacco: Not on file  . Alcohol Use: No    Review of Systems  Unable to perform ROS: Dementia  Gastrointestinal: Positive for abdominal pain.  Neurological: Positive for dizziness.      Allergies  Iohexol and Milk-related compounds  Home Medications   Prior to Admission medications   Medication Sig Start Date End Date Taking? Authorizing Provider  amLODipine (NORVASC) 10 MG tablet Take 10 mg by mouth daily.     Yes Historical Provider, MD  dicyclomine (BENTYL) 10 MG capsule Take 10 mg by mouth 2 (two) times daily as needed for spasms.    Yes Historical Provider, MD  donepezil (ARICEPT) 10 MG tablet Take 10 mg by mouth at bedtime.   Yes Historical Provider, MD  hydrochlorothiazide 25 MG tablet Take 12.5 mg by mouth every morning.    Yes Historical Provider, MD  metFORMIN (GLUCOPHAGE) 500 MG tablet Take by mouth daily with breakfast.   Yes Historical Provider, MD  NAMENDA 5 MG tablet Take 5 mg by mouth 2 (two) times daily.  12/09/13  Yes Historical Provider, MD  niacin (NIASPAN) 500 MG CR tablet Take 500 mg by mouth daily.  12/23/13  Yes Historical Provider, MD  Omega-3 Fatty Acids (FISH OIL) 1000 MG CAPS Take 1 capsule by mouth daily.    Yes Historical Provider, MD   BP 125/54  Pulse 75  Temp(Src) 98.3 F (36.8 C) (Oral)  Resp 12  Ht 4\' 9"  (1.448 m)  Wt 131 lb (59.421 kg)  BMI 28.34 kg/m2  SpO2 100%  16:36 Orthostatic Vital Signs  Orthostatic Lying - BP- Lying: 123/51 mmHg ; Pulse- Lying: 62  Orthostatic Sitting - BP- Sitting: 129/58 mmHg ; Pulse- Sitting: 71  Orthostatic Standing at 0 minutes - BP- Standing at 0 minutes: 136/80 mmHg ; Pulse- Standing at 0 minutes: 77    Physical Exam 1525: Physical examination:  Nursing notes reviewed; Vital signs and O2 SAT reviewed;  Constitutional: Well developed, Well nourished, Well hydrated, In no acute distress; Head:  Normocephalic, atraumatic; Eyes: EOMI, PERRL, No scleral icterus. No conjunctival injection. No obvious FB, hyphema or hypopyon bilat.  +dry skin with scaling around bilat eyes and upper eyelids, NT. No erythema, no ecchymosis, no edema, no vesicles..; ENMT: Mouth and pharynx normal, Mucous membranes moist. TM's clear bilat. +edemetous nasal turbinates bilat with clear rhinorrhea. Mouth and pharynx without lesions. No tonsillar exudates. No intra-oral edema. No submandibular or sublingual edema. No hoarse voice, no drooling, no stridor. No  pain with manipulation of larynx. No trismus.;; Neck: Supple, Full range of motion, No lymphadenopathy; Cardiovascular: Regular rate and rhythm, No gallop; Respiratory: Breath sounds clear & equal bilaterally, No wheezes.  Speaking full sentences with ease, Normal respiratory effort/excursion; Chest: Nontender, Movement normal; Abdomen: Soft, +mild diffuse tenderness to palp. No rebound or guarding. Nondistended, Normal bowel sounds; Genitourinary: No CVA tenderness; Extremities: Pulses normal, No tenderness, No edema, No calf edema or asymmetry.; Neuro: AA&Ox3, vague historian. Major CN grossly intact. Speech clear.  No facial droop.  No nystagmus. Grips equal. Strength 5/5 equal bilat UE's and LE's.  DTR 2/4 equal bilat UE's and LE's.  No gross sensory deficits.  Normal cerebellar testing bilat UE's (finger-nose) and LE's (heel-shin). Climbs on and off stretcher easily by herself. Gait steady..; Skin: Color normal, Warm, Dry. No diffuse rash.    ED Course  Procedures     EKG  Interpretation None      MDM  MDM Reviewed: previous chart, nursing note and vitals Reviewed previous: labs and CT scan Interpretation: labs, CT scan and x-ray    Results for orders placed during the hospital encounter of 07/04/14  CBC WITH DIFFERENTIAL      Result Value Ref Range   WBC 4.8  4.0 - 10.5 K/uL   RBC 4.68  3.87 - 5.11 MIL/uL   Hemoglobin 13.7  12.0 - 15.0 g/dL   HCT 42.1  36.0 - 46.0 %   MCV 90.0  78.0 - 100.0 fL   MCH 29.3  26.0 - 34.0 pg   MCHC 32.5  30.0 - 36.0 g/dL   RDW 13.6  11.5 - 15.5 %   Platelets 298  150 - 400 K/uL   Neutrophils Relative % 47  43 - 77 %   Neutro Abs 2.3  1.7 - 7.7 K/uL   Lymphocytes Relative 42  12 - 46 %   Lymphs Abs 2.0  0.7 - 4.0 K/uL   Monocytes Relative 9  3 - 12 %   Monocytes Absolute 0.4  0.1 - 1.0 K/uL   Eosinophils Relative 1  0 - 5 %   Eosinophils Absolute 0.0  0.0 - 0.7 K/uL   Basophils Relative 1  0 - 1 %   Basophils Absolute 0.0  0.0 - 0.1 K/uL   COMPREHENSIVE METABOLIC PANEL      Result Value Ref Range   Sodium 143  137 - 147 mEq/L   Potassium 3.6 (*) 3.7 - 5.3 mEq/L   Chloride 100  96 - 112 mEq/L   CO2 29  19 - 32 mEq/L   Glucose, Bld 78  70 - 99 mg/dL   BUN 11  6 - 23 mg/dL   Creatinine, Ser 0.74  0.50 - 1.10 mg/dL   Calcium 9.8  8.4 - 10.5 mg/dL   Total Protein 7.2  6.0 - 8.3 g/dL   Albumin 3.7  3.5 - 5.2 g/dL   AST 17  0 - 37 U/L   ALT 16  0 - 35 U/L   Alkaline Phosphatase 82  39 - 117 U/L   Total Bilirubin 0.3  0.3 - 1.2 mg/dL   GFR calc non Af Amer 80 (*) >90 mL/min   GFR calc Af Amer >90  >90 mL/min   Anion gap 14  5 - 15  LIPASE, BLOOD      Result Value Ref Range   Lipase 71 (*) 11 - 59 U/L  LACTIC ACID, PLASMA      Result Value Ref Range   Lactic Acid, Venous 2.3 (*) 0.5 - 2.2 mmol/L  URINALYSIS, ROUTINE W REFLEX MICROSCOPIC      Result Value Ref Range   Color, Urine YELLOW  YELLOW   APPearance CLEAR  CLEAR   Specific Gravity, Urine 1.010  1.005 - 1.030   pH 6.5  5.0 - 8.0   Glucose, UA NEGATIVE  NEGATIVE mg/dL   Hgb urine dipstick NEGATIVE  NEGATIVE   Bilirubin Urine NEGATIVE  NEGATIVE   Ketones, ur NEGATIVE  NEGATIVE mg/dL   Protein, ur NEGATIVE  NEGATIVE mg/dL   Urobilinogen, UA 0.2  0.0 - 1.0 mg/dL   Nitrite NEGATIVE  NEGATIVE   Leukocytes, UA TRACE (*) NEGATIVE  URINE MICROSCOPIC-ADD ON      Result Value Ref Range   Squamous Epithelial / LPF FEW (*) RARE   WBC, UA 3-6  <3 WBC/hpf   Bacteria, UA RARE  RARE   Ct Abdomen Pelvis Wo Contrast 07/04/2014   CLINICAL DATA:  Lower abdominal pain.  EXAM: CT ABDOMEN AND PELVIS WITHOUT CONTRAST  TECHNIQUE: Multidetector CT imaging of the abdomen and pelvis was performed following the standard protocol without IV contrast.  COMPARISON:  02/03/2013  FINDINGS: Lung bases are clear. No effusions. Heart is normal size. Scattered coronary artery calcifications.  Liver, gallbladder, spleen, pancreas, adrenals, kidneys are unremarkable. No renal or ureteral stones. No  hydronephrosis.  Normal retrocecal appendix. Moderate stool burden throughout the colon. Stomach and small bowel are decompressed and unremarkable. Prior hysterectomy. No adnexal masses. Urinary bladder is unremarkable.  No acute bony abnormality or focal bone lesion. Degenerative changes in the lumbar spine.  IMPRESSION: No renal or ureteral stones.  No hydronephrosis.  Moderate stool burden in the colon.  Normal appendix.   Electronically Signed   By: Rolm Baptise M.D.   On: 07/04/2014 17:54   Ct Head Wo Contrast 07/04/2014   CLINICAL DATA:  80 year old female with dizziness and lightheadedness.  EXAM: CT HEAD WITHOUT CONTRAST  TECHNIQUE: Contiguous axial images were obtained from the base of the skull through the vertex without intravenous contrast.  COMPARISON:  03/08/2014 and prior CTs  FINDINGS: A stable right frontoparietal chronic subdural collection measuring up to 4 mm is unchanged.  There is no evidence of acute infarction, midline shift, hydrocephalus, hemorrhage, or new extra-axial collection.  Mild chronic small-vessel white matter ischemic changes are identified.  Right frontal craniotomy changes are present.  IMPRESSION: No evidence of acute intracranial abnormality.  Stable 4 mm chronic right frontoparietal subdural collection without midline shift.  Mild chronic small-vessel white matter ischemic changes.   Electronically Signed   By: Hassan Rowan M.D.   On: 07/04/2014 17:55    1900:  Pt is not orthostatic. Pt has ambulated while in the ED with steady gait, easy resps. Pt has tol PO well without N/V. Pt states she "feels better" and wants to go home now. Workup reassuring. Will tx symptomatically at this time, f/u GI MD and PMD. Dx and testing d/w pt and family.  Questions answered.  Verb understanding, agreeable to d/c home with outpt f/u.      Francine Graven, DO 07/06/14 1601

## 2014-07-05 LAB — URINE CULTURE: Colony Count: 40000

## 2014-08-02 ENCOUNTER — Other Ambulatory Visit (HOSPITAL_COMMUNITY): Payer: Self-pay | Admitting: Internal Medicine

## 2014-08-02 ENCOUNTER — Other Ambulatory Visit (HOSPITAL_COMMUNITY): Payer: Self-pay | Admitting: Family Medicine

## 2014-08-02 DIAGNOSIS — M949 Disorder of cartilage, unspecified: Principal | ICD-10-CM

## 2014-08-02 DIAGNOSIS — M899 Disorder of bone, unspecified: Secondary | ICD-10-CM

## 2014-08-05 ENCOUNTER — Ambulatory Visit (HOSPITAL_COMMUNITY)
Admission: RE | Admit: 2014-08-05 | Discharge: 2014-08-05 | Disposition: A | Discharge status: Home / Self Care | Payer: Medicare Other | Source: Ambulatory Visit | Attending: Internal Medicine | Admitting: Internal Medicine

## 2014-08-05 DIAGNOSIS — M949 Disorder of cartilage, unspecified: Secondary | ICD-10-CM | POA: Insufficient documentation

## 2014-08-05 DIAGNOSIS — M899 Disorder of bone, unspecified: Secondary | ICD-10-CM | POA: Insufficient documentation

## 2015-03-11 DIAGNOSIS — Z6830 Body mass index (BMI) 30.0-30.9, adult: Secondary | ICD-10-CM | POA: Diagnosis not present

## 2015-03-11 DIAGNOSIS — E119 Type 2 diabetes mellitus without complications: Secondary | ICD-10-CM | POA: Diagnosis not present

## 2015-03-11 DIAGNOSIS — E669 Obesity, unspecified: Secondary | ICD-10-CM | POA: Diagnosis not present

## 2015-03-11 DIAGNOSIS — I10 Essential (primary) hypertension: Secondary | ICD-10-CM | POA: Diagnosis not present

## 2015-05-16 ENCOUNTER — Other Ambulatory Visit (HOSPITAL_COMMUNITY): Payer: Self-pay | Admitting: Internal Medicine

## 2015-05-16 DIAGNOSIS — Z1231 Encounter for screening mammogram for malignant neoplasm of breast: Secondary | ICD-10-CM

## 2015-05-23 IMAGING — CT CT HEAD W/O CM
1 series · 15 of 30 positions shown, 19 images · non-contrast
Comparison: 01/31/2012

CLINICAL DATA: Confusion for several months worse for 1 week,
imbalance for 5 days, leg weakness, history colon cancer,
hypertension, diabetes

EXAM:
CT HEAD WITHOUT CONTRAST
TECHNIQUE: Contiguous axial images were obtained from the base of the skull
through the vertex without intravenous contrast.

[Series 2: headseq 4.8 h37s · axial · 0.43mm/px · z∈[+79,+208]mm · 15 of 30 slices shown, 19 images]
[im 2/30  brain]
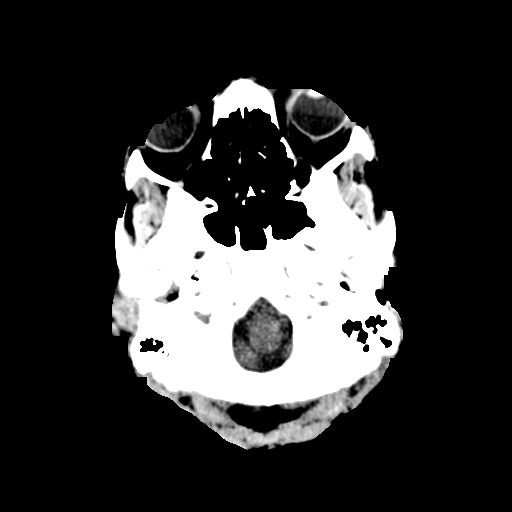
[im 2/30  bone]
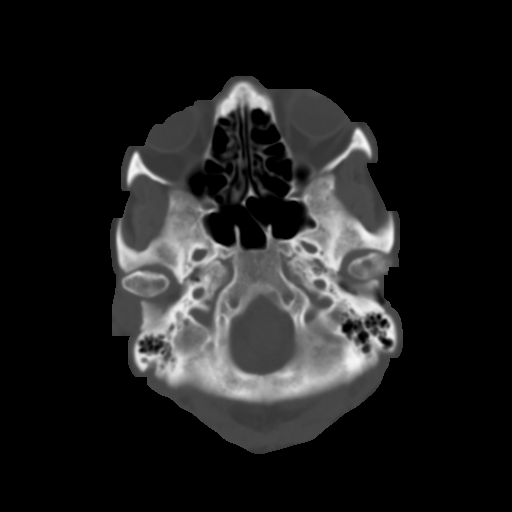
[im 4/30  brain]
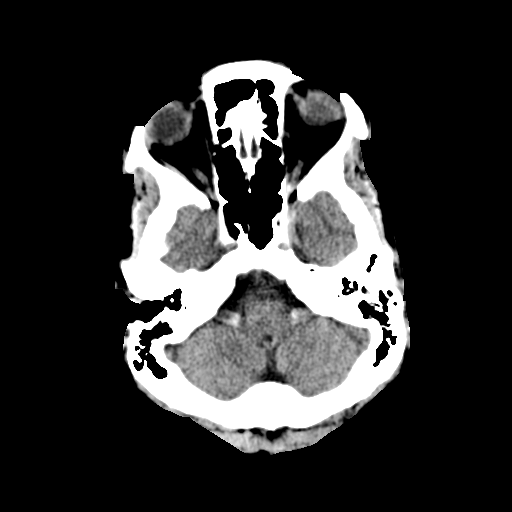
[im 6/30  brain]
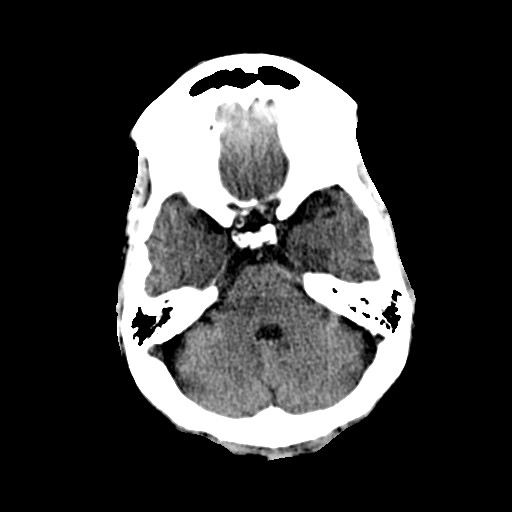
[im 8/30  brain]
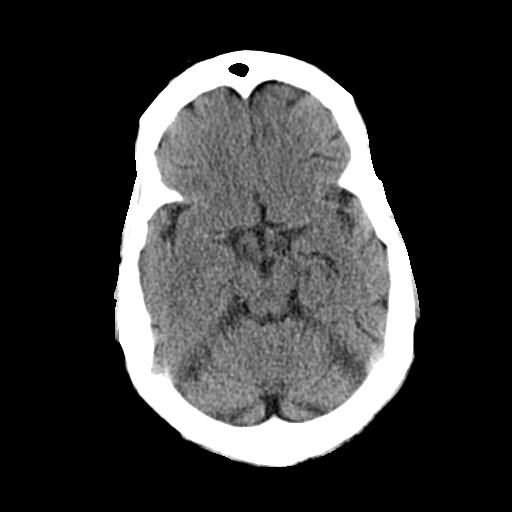
[im 10/30  brain]
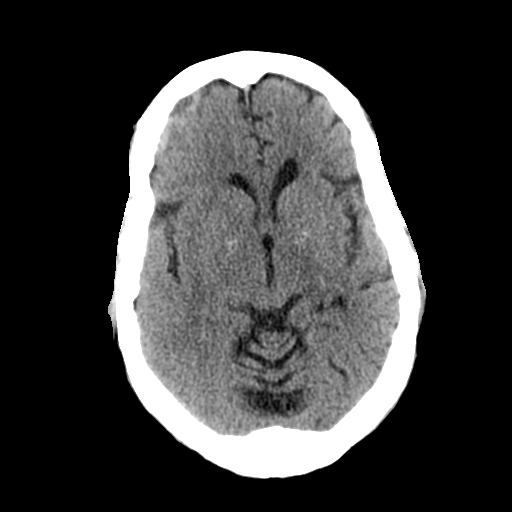
[im 10/30  bone]
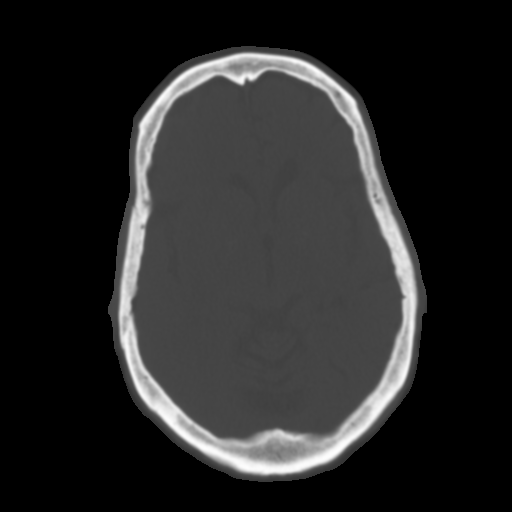
[im 12/30  brain]
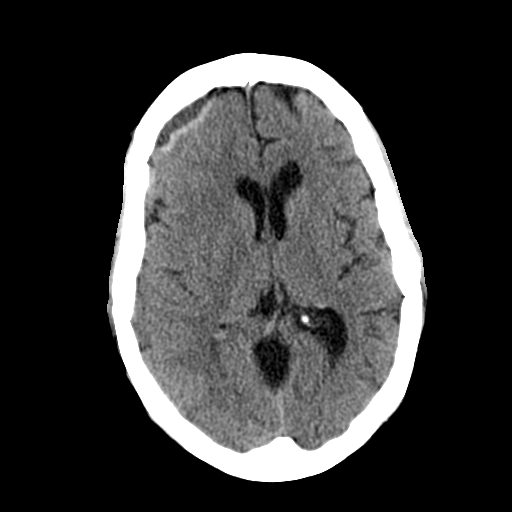
[im 14/30  brain]
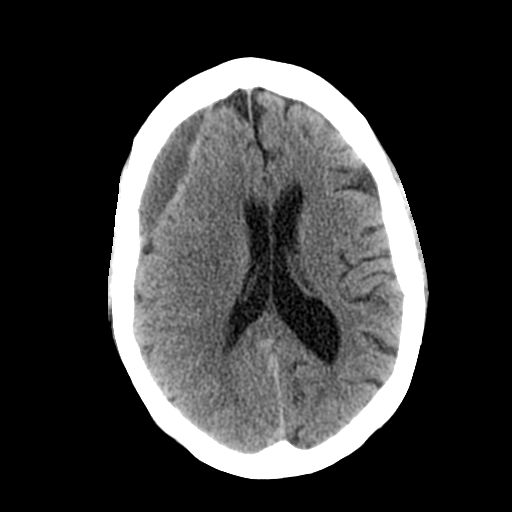
[im 16/30  brain]
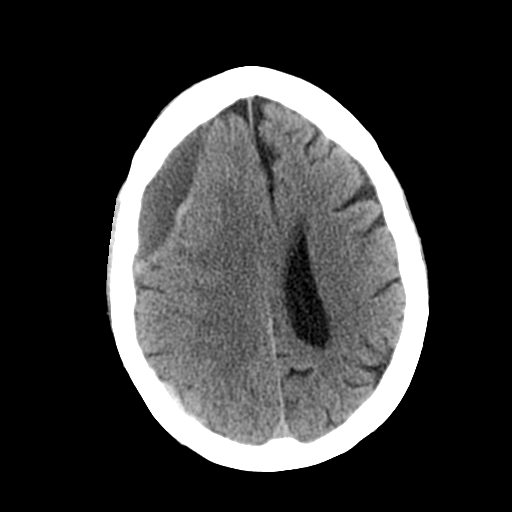
[im 17/30  brain]
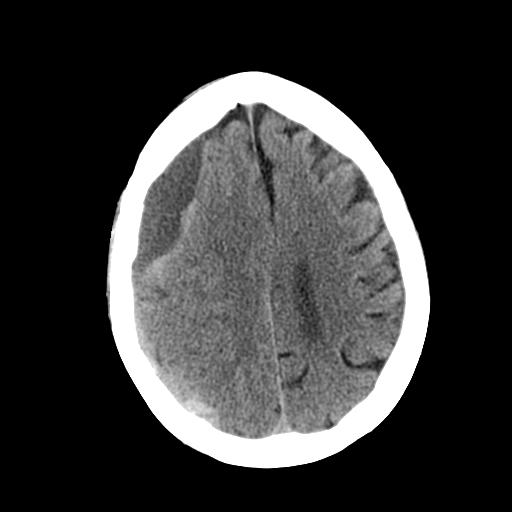
[im 17/30  bone]
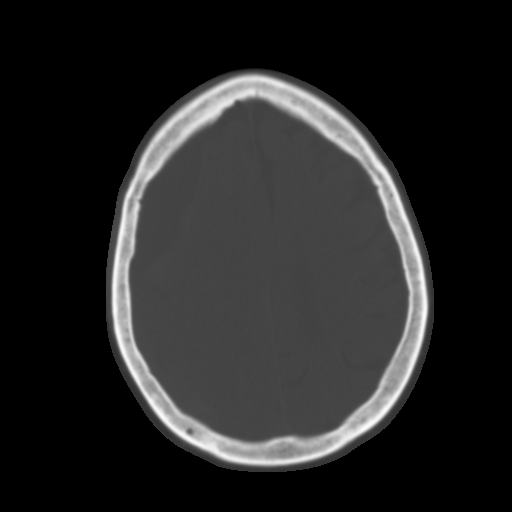
[im 19/30  brain]
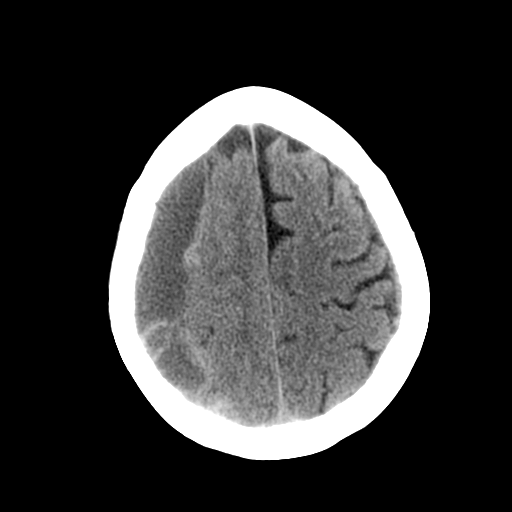
[im 21/30  brain]
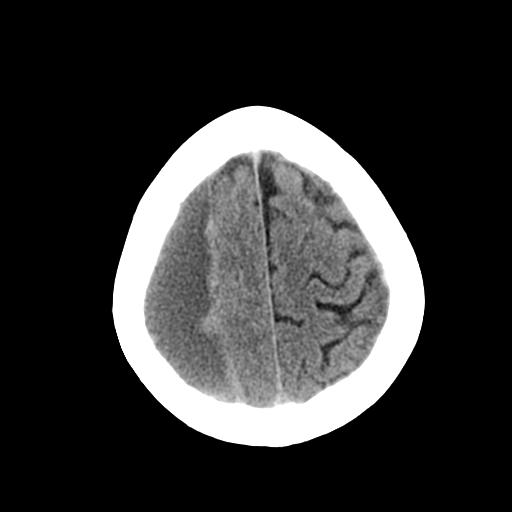
[im 23/30  brain]
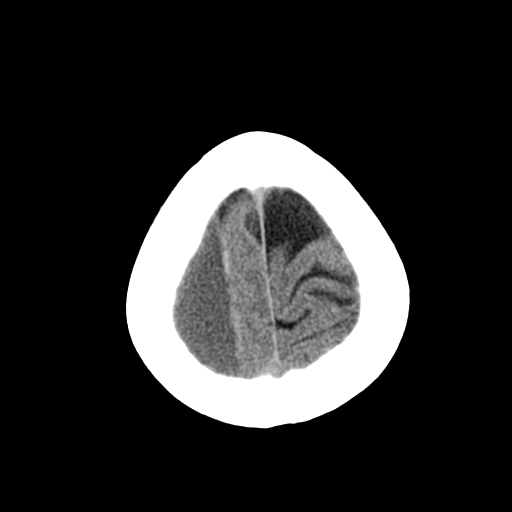
[im 25/30  brain]
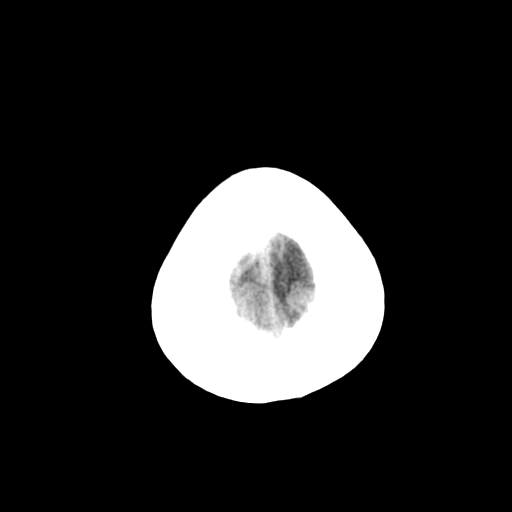
[im 25/30  bone]
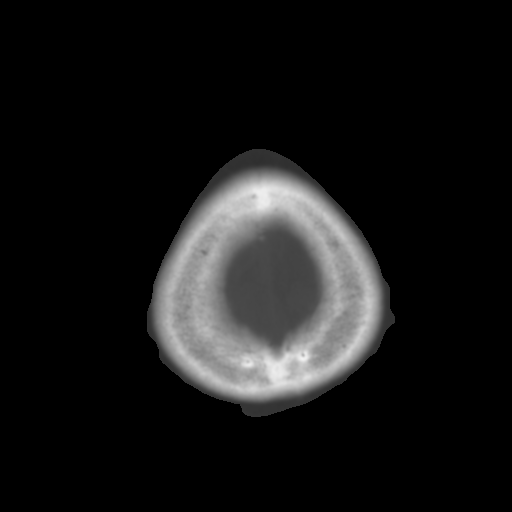
[im 27/30  brain]
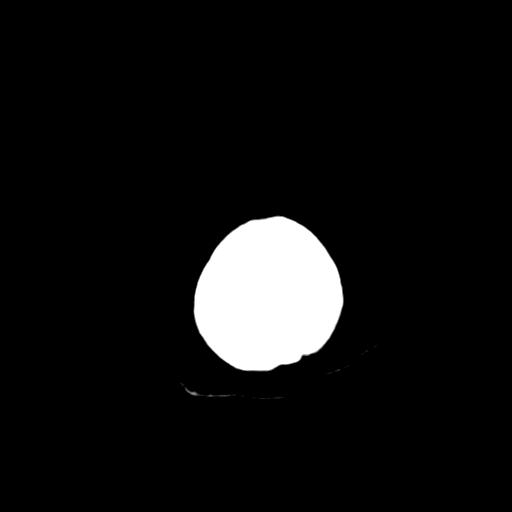
[im 29/30  brain]
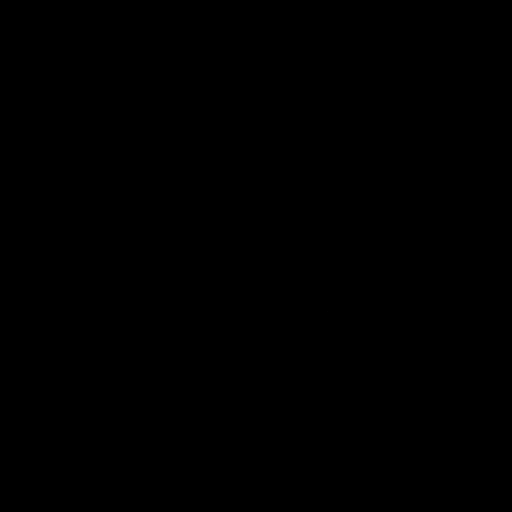

[15 of 30 positions shown; findings below may reference images not displayed]

FINDINGS: Moderate to large right subdural collection in the frontoparietal
region measuring up to 20 mm thick anteriorly.

This compresses the right hemisphere and the right lateral
ventricle.

Mild right to left midline shift of 4 mm.

Observed subdural collection is predominantly low to intermediate
attenuation suggesting this is subacute, though there is minimal
high attenuation posteriorly which could represent a small amount of
acute superimposed hemorrhage.

No intraparenchymal hemorrhage,, mass lesion or evidence of acute
infarction.

No left-sided extra-axial collection.

Bones and sinuses unremarkable.
IMPRESSION: Moderate to large subdural hematoma in right frontoparietal region
up to 20 mm thick with compression of the right hemisphere, right
lateral ventricle, and 4 mm of right to left midline shift.

Intermediate attenuation of this collection suggests this is
predominantly subacute though there is a small amount of higher
attenuation posteriorly/dependently which may represent acute
superimposed hemorrhage.

Critical Value/emergent results were called by telephone at the time
of interpretation on 01/22/2014 at [DATE] to Dr. MARITZA ALEJANDRA CHERUBIN , who
verbally acknowledged these results.

## 2015-06-10 ENCOUNTER — Ambulatory Visit (HOSPITAL_COMMUNITY)
Admission: RE | Admit: 2015-06-10 | Discharge: 2015-06-10 | Disposition: A | Discharge status: Home / Self Care | Payer: Medicare Other | Source: Ambulatory Visit | Attending: Internal Medicine | Admitting: Internal Medicine

## 2015-06-10 DIAGNOSIS — Z1231 Encounter for screening mammogram for malignant neoplasm of breast: Secondary | ICD-10-CM | POA: Diagnosis not present

## 2015-07-13 ENCOUNTER — Emergency Department (HOSPITAL_COMMUNITY): Payer: Medicare Other

## 2015-07-13 ENCOUNTER — Inpatient Hospital Stay (HOSPITAL_COMMUNITY)
Admission: EM | Admit: 2015-07-13 | Discharge: 2015-07-16 | DRG: 871 | Disposition: A | Discharge status: Nursing Home (Includes ICF) | Payer: Medicare Other | Attending: Internal Medicine | Admitting: Internal Medicine

## 2015-07-13 ENCOUNTER — Encounter (HOSPITAL_COMMUNITY): Payer: Self-pay | Admitting: Emergency Medicine

## 2015-07-13 DIAGNOSIS — F0391 Unspecified dementia with behavioral disturbance: Secondary | ICD-10-CM | POA: Diagnosis not present

## 2015-07-13 DIAGNOSIS — E875 Hyperkalemia: Secondary | ICD-10-CM | POA: Diagnosis present

## 2015-07-13 DIAGNOSIS — G9340 Encephalopathy, unspecified: Secondary | ICD-10-CM | POA: Diagnosis not present

## 2015-07-13 DIAGNOSIS — I10 Essential (primary) hypertension: Secondary | ICD-10-CM | POA: Diagnosis present

## 2015-07-13 DIAGNOSIS — E785 Hyperlipidemia, unspecified: Secondary | ICD-10-CM | POA: Diagnosis present

## 2015-07-13 DIAGNOSIS — R569 Unspecified convulsions: Secondary | ICD-10-CM | POA: Diagnosis not present

## 2015-07-13 DIAGNOSIS — G0430 Acute necrotizing hemorrhagic encephalopathy, unspecified: Secondary | ICD-10-CM | POA: Diagnosis not present

## 2015-07-13 DIAGNOSIS — R4182 Altered mental status, unspecified: Secondary | ICD-10-CM

## 2015-07-13 DIAGNOSIS — E119 Type 2 diabetes mellitus without complications: Secondary | ICD-10-CM | POA: Diagnosis not present

## 2015-07-13 DIAGNOSIS — R40242 Glasgow coma scale score 9-12: Secondary | ICD-10-CM | POA: Diagnosis not present

## 2015-07-13 DIAGNOSIS — A419 Sepsis, unspecified organism: Secondary | ICD-10-CM | POA: Diagnosis not present

## 2015-07-13 DIAGNOSIS — K219 Gastro-esophageal reflux disease without esophagitis: Secondary | ICD-10-CM | POA: Diagnosis present

## 2015-07-13 DIAGNOSIS — K760 Fatty (change of) liver, not elsewhere classified: Secondary | ICD-10-CM | POA: Diagnosis not present

## 2015-07-13 DIAGNOSIS — J690 Pneumonitis due to inhalation of food and vomit: Secondary | ICD-10-CM | POA: Diagnosis not present

## 2015-07-13 DIAGNOSIS — F0390 Unspecified dementia without behavioral disturbance: Secondary | ICD-10-CM | POA: Diagnosis present

## 2015-07-13 DIAGNOSIS — R531 Weakness: Secondary | ICD-10-CM | POA: Diagnosis not present

## 2015-07-13 DIAGNOSIS — E876 Hypokalemia: Secondary | ICD-10-CM | POA: Diagnosis present

## 2015-07-13 DIAGNOSIS — R0902 Hypoxemia: Secondary | ICD-10-CM | POA: Diagnosis present

## 2015-07-13 DIAGNOSIS — I6203 Nontraumatic chronic subdural hemorrhage: Secondary | ICD-10-CM | POA: Diagnosis not present

## 2015-07-13 LAB — URINALYSIS, ROUTINE W REFLEX MICROSCOPIC
BILIRUBIN URINE: NEGATIVE
Glucose, UA: NEGATIVE mg/dL
HGB URINE DIPSTICK: NEGATIVE
Ketones, ur: NEGATIVE mg/dL
Leukocytes, UA: NEGATIVE
Nitrite: NEGATIVE
PROTEIN: NEGATIVE mg/dL
Specific Gravity, Urine: 1.01 (ref 1.005–1.030)
UROBILINOGEN UA: 0.2 mg/dL (ref 0.0–1.0)
pH: 7 (ref 5.0–8.0)

## 2015-07-13 LAB — APTT: aPTT: 24 seconds (ref 24–37)

## 2015-07-13 LAB — CBC WITH DIFFERENTIAL/PLATELET
Basophils Absolute: 0 10*3/uL (ref 0.0–0.1)
Basophils Relative: 0 % (ref 0–1)
EOS ABS: 0 10*3/uL (ref 0.0–0.7)
EOS PCT: 0 % (ref 0–5)
HCT: 42.1 % (ref 36.0–46.0)
Hemoglobin: 13.9 g/dL (ref 12.0–15.0)
LYMPHS PCT: 12 % (ref 12–46)
Lymphs Abs: 0.9 10*3/uL (ref 0.7–4.0)
MCH: 29.6 pg (ref 26.0–34.0)
MCHC: 33 g/dL (ref 30.0–36.0)
MCV: 89.8 fL (ref 78.0–100.0)
MONOS PCT: 3 % (ref 3–12)
Monocytes Absolute: 0.2 10*3/uL (ref 0.1–1.0)
Neutro Abs: 6 10*3/uL (ref 1.7–7.7)
Neutrophils Relative %: 85 % — ABNORMAL HIGH (ref 43–77)
Platelets: 289 10*3/uL (ref 150–400)
RBC: 4.69 MIL/uL (ref 3.87–5.11)
RDW: 13.8 % (ref 11.5–15.5)
WBC: 7.1 10*3/uL (ref 4.0–10.5)

## 2015-07-13 LAB — BLOOD GAS, ARTERIAL
Acid-Base Excess: 2.2 mmol/L — ABNORMAL HIGH (ref 0.0–2.0)
BICARBONATE: 26.8 meq/L — AB (ref 20.0–24.0)
Drawn by: 234301
FIO2: 21
O2 Saturation: 96.5 %
PH ART: 7.477 — AB (ref 7.350–7.450)
pCO2 arterial: 35 mmHg (ref 35.0–45.0)
pO2, Arterial: 86.6 mmHg (ref 80.0–100.0)

## 2015-07-13 LAB — COMPREHENSIVE METABOLIC PANEL
ALK PHOS: 72 U/L (ref 38–126)
ALT: 17 U/L (ref 14–54)
ANION GAP: 10 (ref 5–15)
AST: 24 U/L (ref 15–41)
Albumin: 3.9 g/dL (ref 3.5–5.0)
BILIRUBIN TOTAL: 0.7 mg/dL (ref 0.3–1.2)
BUN: 13 mg/dL (ref 6–20)
CALCIUM: 9.4 mg/dL (ref 8.9–10.3)
CO2: 26 mmol/L (ref 22–32)
CREATININE: 0.84 mg/dL (ref 0.44–1.00)
Chloride: 104 mmol/L (ref 101–111)
Glucose, Bld: 172 mg/dL — ABNORMAL HIGH (ref 65–99)
Potassium: 3.5 mmol/L (ref 3.5–5.1)
SODIUM: 140 mmol/L (ref 135–145)
TOTAL PROTEIN: 7.3 g/dL (ref 6.5–8.1)

## 2015-07-13 LAB — AMMONIA: Ammonia: 17 umol/L (ref 9–35)

## 2015-07-13 LAB — PROTIME-INR
INR: 1.06 (ref 0.00–1.49)
PROTHROMBIN TIME: 14 s (ref 11.6–15.2)

## 2015-07-13 LAB — GLUCOSE, CAPILLARY: Glucose-Capillary: 148 mg/dL — ABNORMAL HIGH (ref 65–99)

## 2015-07-13 LAB — LACTIC ACID, PLASMA
LACTIC ACID, VENOUS: 2.8 mmol/L — AB (ref 0.5–2.0)
LACTIC ACID, VENOUS: 2 mmol/L (ref 0.5–2.0)
Lactic Acid, Venous: 2 mmol/L (ref 0.5–2.0)

## 2015-07-13 LAB — TSH: TSH: 1.074 u[IU]/mL (ref 0.350–4.500)

## 2015-07-13 LAB — PROCALCITONIN: Procalcitonin: <0.1 ng/mL

## 2015-07-13 LAB — TROPONIN I: Troponin I: <0.03 ng/mL (ref <0.031)

## 2015-07-13 LAB — I-STAT CG4 LACTIC ACID, ED: Lactic Acid, Venous: 3.01 mmol/L (ref 0.5–2.0)

## 2015-07-13 MED ORDER — POTASSIUM CHLORIDE 20 MEQ PO PACK
20.0000 meq | PACK | Freq: Every day | ORAL | Status: DC
  Administered 2015-07-13 – 2015-07-16 (×4): 20 meq via ORAL
  Filled 2015-07-13 (×4): qty 1

## 2015-07-13 MED ORDER — SODIUM CHLORIDE 0.9 % IV BOLUS (SEPSIS)
1000.0000 mL | INTRAVENOUS | Status: AC
  Administered 2015-07-13 (×2): 1000 mL via INTRAVENOUS

## 2015-07-13 MED ORDER — PIPERACILLIN-TAZOBACTAM 3.375 G IVPB 30 MIN
3.3750 g | Freq: Three times a day (TID) | INTRAVENOUS | Status: DC
  Filled 2015-07-13 (×4): qty 50

## 2015-07-13 MED ORDER — ONDANSETRON HCL 4 MG/2ML IJ SOLN: 4.0000 mg | Freq: Four times a day (QID) | INTRAMUSCULAR | Status: DC | PRN

## 2015-07-13 MED ORDER — PIPERACILLIN-TAZOBACTAM 3.375 G IVPB
INTRAVENOUS | Status: AC
  Filled 2015-07-13: qty 50

## 2015-07-13 MED ORDER — PIPERACILLIN-TAZOBACTAM 3.375 G IVPB 30 MIN
3.3750 g | Freq: Once | INTRAVENOUS | Status: AC
  Administered 2015-07-13: 3.375 g via INTRAVENOUS
  Filled 2015-07-13: qty 50

## 2015-07-13 MED ORDER — VANCOMYCIN HCL IN DEXTROSE 1-5 GM/200ML-% IV SOLN
1000.0000 mg | Freq: Once | INTRAVENOUS | Status: AC
  Administered 2015-07-13: 1000 mg via INTRAVENOUS
  Filled 2015-07-13: qty 200

## 2015-07-13 MED ORDER — AMLODIPINE BESYLATE 5 MG PO TABS
10.0000 mg | ORAL_TABLET | Freq: Every day | ORAL | Status: DC
  Administered 2015-07-13 – 2015-07-16 (×4): 10 mg via ORAL
  Filled 2015-07-13 (×4): qty 2

## 2015-07-13 MED ORDER — ACETAMINOPHEN 325 MG PO TABS: 650.0000 mg | ORAL_TABLET | Freq: Four times a day (QID) | ORAL | Status: DC | PRN

## 2015-07-13 MED ORDER — ENOXAPARIN SODIUM 40 MG/0.4ML ~~LOC~~ SOLN
40.0000 mg | SUBCUTANEOUS | Status: DC
  Administered 2015-07-13 – 2015-07-15 (×3): 40 mg via SUBCUTANEOUS
  Filled 2015-07-13 (×3): qty 0.4

## 2015-07-13 MED ORDER — ACETAMINOPHEN 650 MG RE SUPP
650.0000 mg | Freq: Four times a day (QID) | RECTAL | Status: DC | PRN
Start: 2015-07-13 — End: 2015-07-16

## 2015-07-13 MED ORDER — HALOPERIDOL LACTATE 5 MG/ML IJ SOLN
2.5000 mg | Freq: Once | INTRAMUSCULAR | Status: AC
  Administered 2015-07-14: 2.5 mg via INTRAVENOUS
  Filled 2015-07-13: qty 1

## 2015-07-13 MED ORDER — VANCOMYCIN HCL IN DEXTROSE 1-5 GM/200ML-% IV SOLN
INTRAVENOUS | Status: AC
  Filled 2015-07-13: qty 200

## 2015-07-13 MED ORDER — PIPERACILLIN-TAZOBACTAM 3.375 G IVPB
3.3750 g | Freq: Three times a day (TID) | INTRAVENOUS | Status: DC
  Administered 2015-07-13: 3.375 g via INTRAVENOUS
  Filled 2015-07-13 (×7): qty 50

## 2015-07-13 MED ORDER — PIPERACILLIN-TAZOBACTAM 3.375 G IVPB
3.3750 g | Freq: Three times a day (TID) | INTRAVENOUS | Status: DC
  Filled 2015-07-13 (×3): qty 50

## 2015-07-13 MED ORDER — INSULIN ASPART 100 UNIT/ML ~~LOC~~ SOLN: 0.0000 [IU] | Freq: Three times a day (TID) | SUBCUTANEOUS | Status: DC

## 2015-07-13 MED ORDER — HYDROCHLOROTHIAZIDE 25 MG PO TABS
12.5000 mg | ORAL_TABLET | Freq: Every morning | ORAL | Status: DC
  Administered 2015-07-13: 12.5 mg via ORAL
  Filled 2015-07-13: qty 1

## 2015-07-13 MED ORDER — SODIUM CHLORIDE 0.9 % IV SOLN: INTRAVENOUS | Status: DC

## 2015-07-13 MED ORDER — SODIUM CHLORIDE 0.9 % IJ SOLN
3.0000 mL | Freq: Two times a day (BID) | INTRAMUSCULAR | Status: DC
  Administered 2015-07-15 – 2015-07-16 (×3): 3 mL via INTRAVENOUS

## 2015-07-13 MED ORDER — VANCOMYCIN HCL 500 MG IV SOLR
500.0000 mg | Freq: Two times a day (BID) | INTRAVENOUS | Status: DC
  Filled 2015-07-13 (×3): qty 500

## 2015-07-13 MED ORDER — ONDANSETRON HCL 4 MG PO TABS: 4.0000 mg | ORAL_TABLET | Freq: Four times a day (QID) | ORAL | Status: DC | PRN

## 2015-07-13 MED ORDER — INSULIN ASPART 100 UNIT/ML ~~LOC~~ SOLN: 0.0000 [IU] | Freq: Every day | SUBCUTANEOUS | Status: DC

## 2015-07-13 MED ORDER — SODIUM CHLORIDE 0.9 % IV BOLUS (SEPSIS)
1000.0000 mL | INTRAVENOUS | Status: AC
  Administered 2015-07-13: 1000 mL via INTRAVENOUS

## 2015-07-13 NOTE — ED Provider Notes (Addendum)
CSN: [id number]     Arrival date & time 07/13/15  4332 History  This chart was scribed for Orpah Greek, MD by Terressa Koyanagi, ED Scribe. This patient was seen in room APA18/APA18 and the patient's care was started at 8:52 AM.  LEVEL 5 CAVEAT: AMS  Chief Complaint  Patient presents with  . Altered Mental Status   The history is provided by the EMS personnel. No language interpreter was used.   PCP: Glo Herring., MD HPI Comments: Taylor Andrews is a 81 y.o. female brought in by ambulance, who presents to the Emergency Department complaining of AMS. Per EMS they were contacted this morning by the home where pt resides because: (1) pt wet her bed last night (pt does not have a Hx of urinary incontinence at baseline), (2) was not able to communicate well with the staff (at baseline pt is able to communicate with other without difficulty), and (3) pt was unable to stand due to weakness (at baseline, pt walks without assistance). EMS reports pt had one episode of vomiting in the ambulance en route to the ED.   Past Medical History  Diagnosis Date  . Diabetes mellitus   . Hypertension   . Hyperlipidemia   . Fatty liver     on [place] normal LFT'S  . Iron deficiency     Chronic current parameter normal; 4/09 iron was 12 sat 5%  . GERD (gastroesophageal reflux disease)   . Adenomatous colon polyp     carcinoma in situ  . Dementia   . Chronic abdominal pain   . SDH (subdural hematoma) 01/2014    chronic   Past Surgical History  Procedure Laterality Date  . Colonoscopy  8/05    internal hemorrhoids,polyp bengin  . Esophagogastroduodenoscopy  6/05    tiny antral erosin with some deformity of pyloric channel  . Vesicovaginal fistula closure w/ tah    . Ankle surgery      Left ;pins  . Colonoscopy  01/12/2011    RJJ:OACZYS rectum/ 5-mm polyp at the splenic flexure s/p polypectomy, tattooing, and scarred hepatic flexure, absolutely no evidence of any residual polyp tissue or  neoplasm/Scattered left-sided diverticula. Tubular adenoma, no high-grade dysplasia.Repeat TCS due 01/2016  . Colonoscopy  06/16/2009    AYT:KZSWFUXN hemorrhoids, otherwise normal rectum.Flat polypoid lesion at the hepatic flexure, debulked with piecemeal snare polypectomy.  This area was also tattooed Transverse colon and descending colon  flat adenomatous appearing polyps resected with hot snare cautery as well. Tubulovillous adenomas  . Colonoscopy  December 2010    Dr. Gala Romney: Flat sessile hepatic flexure polyp status post saline-assisted piecemeal polypectomy. Fragments of adenomatous polyp with high-grade dysplasia.  . Colonoscopy  March 2011    Dr. Gala Romney: Minimal persisting polypoid tissue at site of previous piecemeal polypectomy status post hot snare polypectomy. Tubular adenoma, no high-grade dysplasia  . Abdominal hysterectomy    . Craniotomy Right 01/23/2014    Procedure: CRANIOTOMY HEMATOMA EVACUATION SUBDURAL;  Surgeon: Eustace Moore, MD;  Location: [facility];  Service: Neurosurgery;  Laterality: Right;   Family History  Problem Relation Age of Onset  . Lupus Sister   . Other Sister     ? Liver cancer   Social History  Substance Use Topics  . Smoking status: Never Smoker   . Smokeless tobacco: None  . Alcohol Use: No   OB History    No data available     Review of Systems  Unable to perform ROS: Mental  status change      Allergies  Iohexol and Milk-related compounds  Home Medications   Prior to Admission medications   Medication Sig Start Date End Date Taking? Authorizing Provider  amLODipine (NORVASC) 10 MG tablet Take 10 mg by mouth daily.    Yes Historical Provider, MD  dicyclomine (BENTYL) 10 MG capsule Take 10 mg by mouth 4 (four) times daily as needed for spasms.    Yes Historical Provider, MD  docusate sodium (COLACE) 100 MG capsule Take 100 mg by mouth 3 (three) times daily.   Yes Historical Provider, MD  donepezil (ARICEPT) 10 MG tablet Take 10 mg by  mouth at bedtime.   Yes Historical Provider, MD  hydrochlorothiazide 25 MG tablet Take 12.5 mg by mouth every morning.    Yes Historical Provider, MD  metFORMIN (GLUCOPHAGE) 500 MG tablet Take 500 mg by mouth daily with breakfast.    Yes Historical Provider, MD  NAMENDA 5 MG tablet Take 5 mg by mouth 2 (two) times daily.  12/09/13  Yes Historical Provider, MD  niacin (NIASPAN) 500 MG CR tablet Take 500 mg by mouth daily.  12/23/13  Yes Historical Provider, MD  Omega-3 Fatty Acids (FISH OIL) 1000 MG CAPS Take 1 capsule by mouth daily.    Yes Historical Provider, MD  potassium chloride (KLOR-CON) 20 MEQ packet Take 20 mEq by mouth daily.   Yes Historical Provider, MD   Triage Vitals: BP 149/60 mmHg  Pulse 83  Temp(Src) 97.8 F (36.6 C) (Oral)  Resp 16  Ht 5\' 6"  (1.676 m)  Wt 131 lb (59.421 kg)  BMI 21.15 kg/m2  SpO2 95% Physical Exam  Constitutional: She appears well-developed and well-nourished. No distress.  HENT:  Head: Normocephalic and atraumatic.  Right Ear: Hearing normal.  Left Ear: Hearing normal.  Nose: Nose normal.  Mouth/Throat: Oropharynx is clear and moist and mucous membranes are normal.  Eyes: Conjunctivae and EOM are normal. Pupils are equal, round, and reactive to light.  Neck: Normal range of motion. Neck supple.  Cardiovascular: Regular rhythm, S1 normal and S2 normal.  Exam reveals no gallop and no friction rub.   No murmur heard. Pulmonary/Chest: Effort normal and breath sounds normal. No respiratory distress. She exhibits no tenderness.  Abdominal: Soft. Normal appearance and bowel sounds are normal. There is no hepatosplenomegaly. There is no tenderness. There is no rebound, no guarding, no tenderness at McBurney's point and negative Murphy's sign. No hernia.  Musculoskeletal: Normal range of motion.  Neurological: She is alert. She has normal strength. She is disoriented. No cranial nerve deficit or sensory deficit. Coordination normal. GCS eye subscore is 4. GCS  verbal subscore is 5. GCS motor subscore is 6.  Skin: Skin is warm, dry and intact. No rash noted. No cyanosis.  Psychiatric: Her speech is normal.  Nursing note and vitals reviewed.   ED Course  Procedures (including critical care time) DIAGNOSTIC STUDIES: Oxygen Saturation is 95% on RA, adequate by my interpretation.      Labs Review Labs Reviewed  CBC WITH DIFFERENTIAL/PLATELET - Abnormal; Notable for the following:    Neutrophils Relative % 85 (*)    All other components within normal limits  COMPREHENSIVE METABOLIC PANEL - Abnormal; Notable for the following:    Glucose, Bld 172 (*)    All other components within normal limits  I-STAT CG4 LACTIC ACID, ED - Abnormal; Notable for the following:    Lactic Acid, Venous 3.01 (*)    All other components within normal  limits  URINE CULTURE  CULTURE, BLOOD (ROUTINE X 2)  CULTURE, BLOOD (ROUTINE X 2)  URINALYSIS, ROUTINE W REFLEX MICROSCOPIC (NOT AT [facility])  TROPONIN I    Imaging Review Ct Head Wo Contrast  07/13/2015   CLINICAL DATA:  Mental status change  EXAM: CT HEAD WITHOUT CONTRAST  TECHNIQUE: Contiguous axial images were obtained from the base of the skull through the vertex without intravenous contrast.  COMPARISON:  07/04/2014  FINDINGS: No skull fracture. Again noted postsurgical changes with prior right frontal craniotomy. No intracranial hemorrhage, mass effect or midline shift. Paranasal sinuses and mastoid air cells are unremarkable.  Mild cerebral atrophy is stable. No acute cortical infarction. Stable 4 mm right frontoparietal chronic subdural collection unchanged from prior exam. Best seen in axial image 20. Ventricular size is stable from prior exam. No mass lesion is noted on this unenhanced scan.  IMPRESSION: No acute intracranial abnormality. Stable cerebral atrophy. Stable 4 mm right frontoparietal chronic subdural collection.   Electronically Signed   By: Lahoma Crocker M.D.   On: 07/13/2015 10:20   Dg Chest Port 1  View  07/13/2015   CLINICAL DATA:  Altered mental status.  Weakness.  EXAM: PORTABLE CHEST - 1 VIEW  COMPARISON:  January 23, 2014.  FINDINGS: The heart size and mediastinal contours are within normal limits. No pneumothorax is noted. Right lung is clear. Elevated left hemidiaphragm is noted. Mild left basilar opacity is noted which may represent subsegmental atelectasis or possibly early pneumonia. Possible small pleural effusion may be present on the left. The visualized skeletal structures are unremarkable.  IMPRESSION: Elevated left hemidiaphragm. Mild left basilar opacity concerning for subsegmental atelectasis or possibly pneumonia with possible small associated pleural effusion.   Electronically Signed   By: Marijo Conception, M.D.   On: 07/13/2015 09:17   I have personally reviewed and evaluated these images and lab results as part of my medical decision-making.   EKG Interpretation   Date/Time:  Wednesday July 13 2015 08:58:06 EDT Ventricular Rate:  79 PR Interval:  163 QRS Duration: 88 QT Interval:  409 QTC Calculation: 469 R Axis:   54 Text Interpretation:  Sinus rhythm Low voltage, precordial leads Otherwise  within normal limits Confirmed by Flavia Bruss  MD, Mayah Urquidi ([id number]) on  07/13/2015 9:01:24 AM      MDM   Final diagnoses:  Acute encephalopathy  Aspiration pneumonia, unspecified aspiration pneumonia type    Patient presents to the ER for evaluation of mental status changes. Family reports that the patient woke this morning he was confused. She went to bed last night without problems. She normally is alert and oriented, very active. She was noted to have vomiting in her bed overnight. When family found her she was very confused and difficult to arouse. At presentation to the ER, patient continues to be significantly confused. She is somnolent, but arousable. She was normotensive but slightly tachycardic at arrival. She also was noted to be slightly hypoxic for EMS,  saturations around 93%.  Patient has a nonfocal neurologic examination. Head CT was negative. Lab work was entirely normal other than mild elevation of lactic acid. No evidence of urinary tract infection. Kidney function is normal. Electrolytes normal. Troponin was negative, EKG within normal limits. Patient's chest x-ray, however, abnormal. Evidence of pneumonia at the left base. This is concerning for aspiration pneumonia. Patient administered Zosyn. She will be admitted to the hospital.  Addendum: Patient becoming tachycardic and blood pressure dropping, now 97/54. This is concerning for early  sepsis. Patient did have elevated lactic acid. Will provide weight based fluid resuscitation per sepsis protocol.  I personally performed the services described in this documentation, which was scribed in my presence. The recorded information has been reviewed and is accurate.    Orpah Greek, MD 07/13/15 [place], MD 07/13/15 1200

## 2015-07-13 NOTE — Progress Notes (Signed)
ANTIBIOTIC CONSULT NOTE - INITIAL  Pharmacy Consult for zosyn Indication: aspiration PNA  Allergies  Allergen Reactions  . Iohexol      Desc: PT STATES THAT THE CONTRAST THAT SHE HAD IN 2004 MADE HER SICK, PT COULD NOT SPECIFY REACTION TYPES   . Milk-Related Compounds     Lactose Intolerant     Patient Measurements: Height: 5\' 6"  (167.6 cm) Weight: 131 lb (59.421 kg) IBW/kg (Calculated) : 59.3   Vital Signs: Temp: 97.8 F (36.6 C) (09/07 0859) Temp Source: Oral (09/07 0859) BP: 120/62 mmHg (09/07 1100) Pulse Rate: 109 (09/07 1100) Intake/Output from previous day:   Intake/Output from this shift:    Labs:  Recent Labs  07/13/15 0939 07/13/15 0941  WBC 7.1  --   HGB 13.9  --   PLT 289  --   CREATININE  --  0.84   Estimated Creatinine Clearance: 51.7 mL/min (by C-G formula based on Cr of 0.84). No results for input(s): VANCOTROUGH, VANCOPEAK, VANCORANDOM, GENTTROUGH, GENTPEAK, GENTRANDOM, TOBRATROUGH, TOBRAPEAK, TOBRARND, AMIKACINPEAK, AMIKACINTROU, AMIKACIN in the last 72 hours.   Microbiology: No results found for this or any previous visit (from the past 720 hour(s)).  Medical History: Past Medical History  Diagnosis Date  . Diabetes mellitus   . Hypertension   . Hyperlipidemia   . Fatty liver     on [place] normal LFT'S  . Iron deficiency     Chronic current parameter normal; 4/09 iron was 12 sat 5%  . GERD (gastroesophageal reflux disease)   . Adenomatous colon polyp     carcinoma in situ  . Dementia   . Chronic abdominal pain   . SDH (subdural hematoma) 01/2014    chronic    Medications:  See medication history Assessment: 81 yo lady to start zosyn for aspiration PNA.  She has AMS and vomited in the ambulance en route to the ED  Goal of Therapy:  Eradication of infection  Plan:  Zosyn 3.375 gm IV X 1 over 30 min then 3.375 gm IV q8 hours IE F/u renal function, cultures and clinical course  Thanks for allowing pharmacy to be a part of this  patient's care.  Excell Seltzer, PharmD Clinical Pharmacist 07/13/2015,11:23 AM

## 2015-07-13 NOTE — ED Notes (Signed)
Pt is difficult stick.  Enough blood obtained for CBC.  Will attempt further for additional orders.

## 2015-07-13 NOTE — Progress Notes (Signed)
Pt keeps attempting to get out of bed and is very aggitated. She keeps stating that there are spiders crawling around and she is getting up to kill them. Spoke with Dr.Schoor who put in an order for 2.5 of Haldol. Will continue to monitor

## 2015-07-13 NOTE — H&P (Signed)
[facility] History and Physical  Taylor Andrews NKN:[id number] DOB: 1934/02/18 DOA: 07/13/2015  Referring physician: dr. Betsey Holiday, ER PCP: Glo Herring., MD   Chief Complaint: altered mental status  HPI: Taylor Andrews is a 81 y.o. female who has a history of dementia and is a resident of an ALF, was brought to the emergency room today with altered mental status. Coronary patient's family, she was in her usual state of health yesterday, did not have any complaints. No reported history of fevers. She had gone to bed and overnight had an episode of vomiting while in bed. She also had some urinary incontinence. This morning she was noted to be increasingly lethargic, sleeping more and have periods of confusion. They also noticed that she may be having hallucinations since she was reaching for things that weren't there. She was increasingly confused. She was evaluated in the emergency room where imaging indicated a possible pneumonia. She denies any shortness of breath or cough. She is being admitted for further treatments.   Review of Systems:  Cannot assess due to mental status   Past Medical History  Diagnosis Date  . Diabetes mellitus   . Hypertension   . Hyperlipidemia   . Fatty liver     on [place] normal LFT'S  . Iron deficiency     Chronic current parameter normal; 4/09 iron was 12 sat 5%  . GERD (gastroesophageal reflux disease)   . Adenomatous colon polyp     carcinoma in situ  . Dementia   . Chronic abdominal pain   . SDH (subdural hematoma) 01/2014    chronic   Past Surgical History  Procedure Laterality Date  . Colonoscopy  8/05    internal hemorrhoids,polyp bengin  . Esophagogastroduodenoscopy  6/05    tiny antral erosin with some deformity of pyloric channel  . Vesicovaginal fistula closure w/ tah    . Ankle surgery      Left ;pins  . Colonoscopy  01/12/2011    FXT:KWIOXB rectum/ 5-mm polyp at the splenic flexure s/p polypectomy, tattooing, and scarred hepatic  flexure, absolutely no evidence of any residual polyp tissue or neoplasm/Scattered left-sided diverticula. Tubular adenoma, no high-grade dysplasia.Repeat TCS due 01/2016  . Colonoscopy  06/16/2009    DZH:GDJMEQAS hemorrhoids, otherwise normal rectum.Flat polypoid lesion at the hepatic flexure, debulked with piecemeal snare polypectomy.  This area was also tattooed Transverse colon and descending colon  flat adenomatous appearing polyps resected with hot snare cautery as well. Tubulovillous adenomas  . Colonoscopy  December 2010    Dr. Gala Romney: Flat sessile hepatic flexure polyp status post saline-assisted piecemeal polypectomy. Fragments of adenomatous polyp with high-grade dysplasia.  . Colonoscopy  March 2011    Dr. Gala Romney: Minimal persisting polypoid tissue at site of previous piecemeal polypectomy status post hot snare polypectomy. Tubular adenoma, no high-grade dysplasia  . Abdominal hysterectomy    . Craniotomy Right 01/23/2014    Procedure: CRANIOTOMY HEMATOMA EVACUATION SUBDURAL;  Surgeon: Eustace Moore, MD;  Location: [facility];  Service: Neurosurgery;  Laterality: Right;   Social History:  reports that she has never smoked. She does not have any smokeless tobacco history on file. She reports that she does not drink alcohol or use illicit drugs.  Allergies  Allergen Reactions  . Iohexol      Desc: PT STATES THAT THE CONTRAST THAT SHE HAD IN 2004 MADE HER SICK, PT COULD NOT SPECIFY REACTION TYPES   . Milk-Related Compounds     Lactose Intolerant  Family History  Problem Relation Age of Onset  . Lupus Sister   . Other Sister     ? Liver cancer    Prior to Admission medications   Medication Sig Start Date End Date Taking? Authorizing Provider  amLODipine (NORVASC) 10 MG tablet Take 10 mg by mouth daily.    Yes Historical Provider, MD  dicyclomine (BENTYL) 10 MG capsule Take 10 mg by mouth 4 (four) times daily as needed for spasms.    Yes Historical Provider, MD  docusate  sodium (COLACE) 100 MG capsule Take 100 mg by mouth 3 (three) times daily.   Yes Historical Provider, MD  donepezil (ARICEPT) 10 MG tablet Take 10 mg by mouth at bedtime.   Yes Historical Provider, MD  hydrochlorothiazide 25 MG tablet Take 12.5 mg by mouth every morning.    Yes Historical Provider, MD  metFORMIN (GLUCOPHAGE) 500 MG tablet Take 500 mg by mouth daily with breakfast.    Yes Historical Provider, MD  NAMENDA 5 MG tablet Take 5 mg by mouth 2 (two) times daily.  12/09/13  Yes Historical Provider, MD  niacin (NIASPAN) 500 MG CR tablet Take 500 mg by mouth daily.  12/23/13  Yes Historical Provider, MD  Omega-3 Fatty Acids (FISH OIL) 1000 MG CAPS Take 1 capsule by mouth daily.    Yes Historical Provider, MD  potassium chloride (KLOR-CON) 20 MEQ packet Take 20 mEq by mouth daily.   Yes Historical Provider, MD   Physical Exam: Filed Vitals:   07/13/15 1200 07/13/15 1232 07/13/15 1313 07/13/15 1532  BP: 121/59  149/64 155/46  Pulse: 100  86 75  Temp:  98 F (36.7 C) 97.7 F (36.5 C) 97.5 F (36.4 C)  TempSrc:  Oral Axillary Oral  Resp: 20  18 18   Height:   5\' 6"  (1.676 m)   Weight:   61.825 kg (136 lb 4.8 oz)   SpO2: 95%  98% 98%    Wt Readings from Last 3 Encounters:  07/13/15 61.825 kg (136 lb 4.8 oz)  07/04/14 59.421 kg (131 lb)  04/21/14 57.698 kg (127 lb 3.2 oz)    General:  Sitting up in bed, no distress Eyes: Pupils are pin point and equal bilaterally, normal lids, irises & conjunctiva ENT: grossly normal hearing, lips & tongue Neck: no LAD, masses or thyromegaly Cardiovascular: RRR, no m/r/g. No LE edema. Telemetry: SR, no arrhythmias  Respiratory: CTA bilaterally, no w/r/r. Normal respiratory effort. Abdomen: soft, ntnd Skin: no rash or induration seen on limited exam Musculoskeletal: grossly normal tone BUE/BLE Psychiatric: confused Neurologic: no gross focal deficits          Labs on Admission:  Basic Metabolic Panel:  Recent Labs Lab 07/13/15 0941  NA  140  K 3.5  CL 104  CO2 26  GLUCOSE 172*  BUN 13  CREATININE 0.84  CALCIUM 9.4   Liver Function Tests:  Recent Labs Lab 07/13/15 0941  AST 24  ALT 17  ALKPHOS 72  BILITOT 0.7  PROT 7.3  ALBUMIN 3.9   No results for input(s): LIPASE, AMYLASE in the last 168 hours. No results for input(s): AMMONIA in the last 168 hours. CBC:  Recent Labs Lab 07/13/15 0939  WBC 7.1  NEUTROABS 6.0  HGB 13.9  HCT 42.1  MCV 89.8  PLT 289   Cardiac Enzymes:  Recent Labs Lab 07/13/15 0941  TROPONINI <0.03    BNP (last 3 results) No results for input(s): BNP in the last 8760 hours.  ProBNP (  last 3 results) No results for input(s): PROBNP in the last 8760 hours.  CBG: No results for input(s): GLUCAP in the last 168 hours.  Radiological Exams on Admission: Ct Head Wo Contrast  07/13/2015   CLINICAL DATA:  Mental status change  EXAM: CT HEAD WITHOUT CONTRAST  TECHNIQUE: Contiguous axial images were obtained from the base of the skull through the vertex without intravenous contrast.  COMPARISON:  07/04/2014  FINDINGS: No skull fracture. Again noted postsurgical changes with prior right frontal craniotomy. No intracranial hemorrhage, mass effect or midline shift. Paranasal sinuses and mastoid air cells are unremarkable.  Mild cerebral atrophy is stable. No acute cortical infarction. Stable 4 mm right frontoparietal chronic subdural collection unchanged from prior exam. Best seen in axial image 20. Ventricular size is stable from prior exam. No mass lesion is noted on this unenhanced scan.  IMPRESSION: No acute intracranial abnormality. Stable cerebral atrophy. Stable 4 mm right frontoparietal chronic subdural collection.   Electronically Signed   By: Lahoma Crocker M.D.   On: 07/13/2015 10:20   Dg Chest Port 1 View  07/13/2015   CLINICAL DATA:  Altered mental status.  Weakness.  EXAM: PORTABLE CHEST - 1 VIEW  COMPARISON:  January 23, 2014.  FINDINGS: The heart size and mediastinal contours are  within normal limits. No pneumothorax is noted. Right lung is clear. Elevated left hemidiaphragm is noted. Mild left basilar opacity is noted which may represent subsegmental atelectasis or possibly early pneumonia. Possible small pleural effusion may be present on the left. The visualized skeletal structures are unremarkable.  IMPRESSION: Elevated left hemidiaphragm. Mild left basilar opacity concerning for subsegmental atelectasis or possibly pneumonia with possible small associated pleural effusion.   Electronically Signed   By: Marijo Conception, M.D.   On: 07/13/2015 09:17    EKG: Independently reviewed. No acute changes  Assessment/Plan Active Problems:   Aspiration pneumonia   Acute encephalopathy   Sepsis   Dementia   HTN (hypertension)   DM (diabetes mellitus), type 2   Encephalopathy   1. Sepsis. Patient was noted to be mildly hypotensive, tachycardic and had change in mental status. Etiology includes possible pneumonia. She is receiving IV fluids per sepsis protocol and lactic acid is trending down. She is on broad-spectrum antibiotic cultures have been sent. Urinalysis is unremarkable and she does not have any neck stiffness. Continue to follow. 2. Possible aspiration pneumonia. Found on chest x-ray . history of vomiting prior to arrival. She does not appear to be significantly short of breath or coughing. She has been started on antibiotics. Have low threshold to discontinue diuretics as her clinical condition improves. 3. Acute encephalopathy. Etiology is not entirely clear. ABG is unremarkable. Possibly related to pneumonia. She is afebrile at this time. She does not appear to have any gross focal deficits. She has not been started on any new medications. Will continue to monitor at this time. If she has not improved in the next 24 hours, can consider further imaging with MRI brain. CT head was unremarkable for acute changes. 4. Diabetes. Hold metformin and start sliding scale  insulin. 5. Hypertension. Continue outpatient regimen. 6. Dementia. Continue Namenda and Aricept.  Code Status: full code DVT Prophylaxis: lovenox Family Communication: discussed with family at bedside Disposition Plan: discharge back to ALF once improved  Time spent: 57mins  Lanna Labella [facility] Pager [phone]

## 2015-07-13 NOTE — ED Notes (Signed)
EMS states that pt woke up this morning not acting like herself.  Pt is alert but confused.

## 2015-07-13 NOTE — Progress Notes (Signed)
ANTIBIOTIC CONSULT NOTE - INITIAL  Pharmacy Consult for Vancomycin & Zosyn Indication: pneumonia  Allergies  Allergen Reactions  . Iohexol      Desc: PT STATES THAT THE CONTRAST THAT SHE HAD IN 2004 MADE HER SICK, PT COULD NOT SPECIFY REACTION TYPES   . Milk-Related Compounds     Lactose Intolerant     Patient Measurements: Height: 5\' 6"  (167.6 cm) Weight: 136 lb 4.8 oz (61.825 kg) IBW/kg (Calculated) : 59.3 Adjusted Body Weight:   Vital Signs: Temp: 97.5 F (36.4 C) (09/07 1532) Temp Source: Oral (09/07 1532) BP: 155/46 mmHg (09/07 1532) Pulse Rate: 75 (09/07 1532) Intake/Output from previous day:   Intake/Output from this shift:    Labs:  Recent Labs  07/13/15 0939 07/13/15 0941  WBC 7.1  --   HGB 13.9  --   PLT 289  --   CREATININE  --  0.84   Estimated Creatinine Clearance: 51.7 mL/min (by C-G formula based on Cr of 0.84). No results for input(s): VANCOTROUGH, VANCOPEAK, VANCORANDOM, GENTTROUGH, GENTPEAK, GENTRANDOM, TOBRATROUGH, TOBRAPEAK, TOBRARND, AMIKACINPEAK, AMIKACINTROU, AMIKACIN in the last 72 hours.   Microbiology: No results found for this or any previous visit (from the past 720 hour(s)).  Medical History: Past Medical History  Diagnosis Date  . Diabetes mellitus   . Hypertension   . Hyperlipidemia   . Fatty liver     on [place] normal LFT'S  . Iron deficiency     Chronic current parameter normal; 4/09 iron was 12 sat 5%  . GERD (gastroesophageal reflux disease)   . Adenomatous colon polyp     carcinoma in situ  . Dementia   . Chronic abdominal pain   . SDH (subdural hematoma) 01/2014    chronic    Medications:  Scheduled:  . amLODipine  10 mg Oral Daily  . enoxaparin (LOVENOX) injection  40 mg Subcutaneous Q24H  . hydrochlorothiazide  12.5 mg Oral q morning - 10a  . insulin aspart  0-5 Units Subcutaneous QHS  . [START ON 07/14/2015] insulin aspart  0-9 Units Subcutaneous TID WC  . piperacillin-tazobactam (ZOSYN)  IV  3.375 g  Intravenous Q8H  . potassium chloride  20 mEq Oral Daily  . sodium chloride  1,000 mL Intravenous Q1H  . sodium chloride  3 mL Intravenous Q12H  . [START ON 07/14/2015] vancomycin  500 mg Intravenous Q12H  . vancomycin  1,000 mg Intravenous Once   Assessment:  81 yo female ED patient, sepsis, possible aspiration pneumonia Zosyn 3.375 GM IV given at 12 noon today Vancomycin 1 gm IV ordered in ED  Goal of Therapy:  Vancomycin trough level 15-20 mcg/ml  Plan:  Zosyn 3.375 GM IV every 8 hours, next dose 8 PM tonight Vancomycin 500 mg IV every 12 hours, starting 12 hours after Vancomycin 1 GM dose ordered in ED Vancomycin trough at steady state Labs per protocol  Abner Greenspan, Grae Leathers Bennett 07/13/2015,6:18 PM

## 2015-07-13 NOTE — Progress Notes (Addendum)
[facility] PROGRESS NOTE  Taylor Andrews WFU:[id number] DOB: October 11, 1934 DOA: 07/13/2015 PCP: Glo Herring., MD  Assessment/Plan: 1. Sepsis likely related to PNA. Patient is currently hypertensive, but no longer tachycardic. Mental status remains unchanged. Pt received IV fluids per sepsis protocol. Lactic acid has trended down. She is on broad-spectrum antibiotics. Cultures pending. Urinalysis is unremarkable and she does not have any neck stiffness. She is hemodynamically stable. Will continue to follow.  2. Possible aspiration pneumonia. Found on chest x-ray. History of vomiting while sleeping prior to arrival. She does not appear to be significantly short of breath or coughing.Continue antibiotics. Have low threshold to discontinue antibiotics as her clinical condition improves. 3. Acute encephalopathy. Etiology is not entirely clear. ABG is unremarkable. Possibly related to pneumonia. Remains afebrile. She does not appear to have any gross focal deficits. She has not been started on any new medications. Will continue to monitor at this time. CT head was unremarkable for acute changes. Since she has had an acute change in mental status and symptoms are persisting, we will check MRI brain. We will also check EEG and request Neurology consultation. Ammonia and TSH are normal. B12 is in process. Will also order RPR.  4. Diabetes. Will continue to hold metformin. Continue SSI. 5. Hypertension. Will continue outpatient regimen. 6. Dementia. Will continue Namenda and Aricept. 7. Hyperkalemia. Replace. Check magnesium in AM.  Code Status: Full DVT Prophylaxis: Lovenox Family Communication: Daughter at bedside Disposition Plan: Discharge upon improvement   Consultants:  Neurology  Procedures:    Antibiotics:  Vancomycin 9/8>>  HPI/Subjective: Pt states she feels better. Per daughter, she can understand patient's words better but states that she is speaking to people who are not  here.  Objective: Filed Vitals:   07/14/15 0500  BP: 145/78  Pulse: 98  Temp: 98.5 F (36.9 C)  Resp: 18    Intake/Output Summary (Last 24 hours) at 07/14/15 1227 Last data filed at 07/14/15 0800  Gross per 24 hour  Intake    480 ml  Output      0 ml  Net    480 ml   Filed Weights   07/13/15 0859 07/13/15 1313  Weight: 59.421 kg (131 lb) 61.825 kg (136 lb 4.8 oz)    Exam: General:  Appears comfortable, calm. Cardiovascular: Regular rate and rhythm, no murmur, rub or gallop. No lower extremity edema. Telemetry: Sinus rhythm, no arrhythmias  Respiratory: Clear to auscultation bilaterally, no wheezes, rales or rhonchi. Normal respiratory effort. Abdomen: soft, ntnd Skin: no rash or induration  Musculoskeletal: grossly normal tone bilateral upper and lower extremities Psychiatric: Confused Neurologic: Oriented to self, place. Does not appear to have focal deficits.   Data Reviewed: Basic Metabolic Panel:  Recent Labs Lab 07/13/15 0941 07/14/15 0130  NA 140 147*  K 3.5 2.7*  CL 104 113*  CO2 26 26  GLUCOSE 172* 142*  BUN 13 8  CREATININE 0.84 0.64  CALCIUM 9.4 8.8*  MG  --  2.1   Liver Function Tests:  Recent Labs Lab 07/13/15 0941 07/14/15 0130  AST 24 29  ALT 17 18  ALKPHOS 72 65  BILITOT 0.7 0.6  PROT 7.3 6.7  ALBUMIN 3.9 3.6   CBC:  Recent Labs Lab 07/13/15 0939 07/14/15 0130  WBC 7.1 7.4  NEUTROABS 6.0  --   HGB 13.9 13.0  HCT 42.1 39.4  MCV 89.8 89.7  PLT 289 273   Cardiac Enzymes:  Recent Labs Lab 07/13/15 0941  TROPONINI <0.03  BNP (last 3 results)  ProBNP (last 3 results)  CBG:   Studies: Ct Head Wo Contrast  07/13/2015   CLINICAL DATA:  Mental status change  EXAM: CT HEAD WITHOUT CONTRAST  TECHNIQUE: Contiguous axial images were obtained from the base of the skull through the vertex without intravenous contrast.  COMPARISON:  07/04/2014  FINDINGS: No skull fracture. Again noted postsurgical changes with prior right  frontal craniotomy. No intracranial hemorrhage, mass effect or midline shift. Paranasal sinuses and mastoid air cells are unremarkable.  Mild cerebral atrophy is stable. No acute cortical infarction. Stable 4 mm right frontoparietal chronic subdural collection unchanged from prior exam. Best seen in axial image 20. Ventricular size is stable from prior exam. No mass lesion is noted on this unenhanced scan.  IMPRESSION: No acute intracranial abnormality. Stable cerebral atrophy. Stable 4 mm right frontoparietal chronic subdural collection.   Electronically Signed   By: Lahoma Crocker M.D.   On: 07/13/2015 10:20   Dg Chest Port 1 View  07/13/2015   CLINICAL DATA:  Altered mental status.  Weakness.  EXAM: PORTABLE CHEST - 1 VIEW  COMPARISON:  January 23, 2014.  FINDINGS: The heart size and mediastinal contours are within normal limits. No pneumothorax is noted. Right lung is clear. Elevated left hemidiaphragm is noted. Mild left basilar opacity is noted which may represent subsegmental atelectasis or possibly early pneumonia. Possible small pleural effusion may be present on the left. The visualized skeletal structures are unremarkable.  IMPRESSION: Elevated left hemidiaphragm. Mild left basilar opacity concerning for subsegmental atelectasis or possibly pneumonia with possible small associated pleural effusion.   Electronically Signed   By: Marijo Conception, M.D.   On: 07/13/2015 09:17    Scheduled Meds: . amLODipine  10 mg Oral Daily  . enoxaparin (LOVENOX) injection  40 mg Subcutaneous Q24H  . insulin aspart  0-5 Units Subcutaneous QHS  . insulin aspart  0-9 Units Subcutaneous TID WC  . LORazepam  1 mg Intravenous Once  . piperacillin-tazobactam (ZOSYN)  IV  3.375 g Intravenous Q8H  . potassium chloride  20 mEq Oral Daily  . sodium chloride  3 mL Intravenous Q12H  . vancomycin  500 mg Intravenous Q12H   Continuous Infusions: . sodium chloride 0.45 % 1,000 mL with potassium chloride 40 mEq infusion 75 mL/hr  at 07/14/15 1054    Active Problems:   Aspiration pneumonia   Acute encephalopathy   Sepsis   Dementia   HTN (hypertension)   DM (diabetes mellitus), type 2   Encephalopathy   Hypokalemia    Time spent: 35 minutes  I, Rhett Bannister, acting a scribe, recorded this note contemporaneously in the presence of Dr. Kathie Dike, M.D. on 07/14/2015 at 12:27 PM  Kathie Dike, M.D.  [facility] Pager [phone]. If 7PM-7AM, please contact night-coverage at [email or web address], password [facility] 07/14/2015, 12:27 PM  LOS: 1 day     I have reviewed the above documentation for accuracy and completeness, and I agree with the above.  Tinita Brooker

## 2015-07-13 NOTE — Progress Notes (Signed)
Patient with critical lactic acid of 2.8. Dr. Roderic Palau notified.

## 2015-07-14 ENCOUNTER — Inpatient Hospital Stay (HOSPITAL_COMMUNITY)
Admit: 2015-07-14 | Discharge: 2015-07-14 | Disposition: A | Discharge status: Home / Self Care | Payer: Medicare Other | Attending: Internal Medicine | Admitting: Internal Medicine

## 2015-07-14 ENCOUNTER — Inpatient Hospital Stay (HOSPITAL_COMMUNITY): Payer: Medicare Other

## 2015-07-14 DIAGNOSIS — G9340 Encephalopathy, unspecified: Secondary | ICD-10-CM | POA: Insufficient documentation

## 2015-07-14 DIAGNOSIS — E876 Hypokalemia: Secondary | ICD-10-CM | POA: Diagnosis not present

## 2015-07-14 LAB — URINE CULTURE: Culture: NO GROWTH

## 2015-07-14 LAB — CBC
HEMATOCRIT: 39.4 % (ref 36.0–46.0)
Hemoglobin: 13 g/dL (ref 12.0–15.0)
MCH: 29.6 pg (ref 26.0–34.0)
MCHC: 33 g/dL (ref 30.0–36.0)
MCV: 89.7 fL (ref 78.0–100.0)
PLATELETS: 273 10*3/uL (ref 150–400)
RBC: 4.39 MIL/uL (ref 3.87–5.11)
RDW: 13.7 % (ref 11.5–15.5)
WBC: 7.4 10*3/uL (ref 4.0–10.5)

## 2015-07-14 LAB — COMPREHENSIVE METABOLIC PANEL
ALT: 18 U/L (ref 14–54)
AST: 29 U/L (ref 15–41)
Albumin: 3.6 g/dL (ref 3.5–5.0)
Alkaline Phosphatase: 65 U/L (ref 38–126)
Anion gap: 8 (ref 5–15)
BILIRUBIN TOTAL: 0.6 mg/dL (ref 0.3–1.2)
BUN: 8 mg/dL (ref 6–20)
CHLORIDE: 113 mmol/L — AB (ref 101–111)
CO2: 26 mmol/L (ref 22–32)
CREATININE: 0.64 mg/dL (ref 0.44–1.00)
Calcium: 8.8 mg/dL — ABNORMAL LOW (ref 8.9–10.3)
Glucose, Bld: 142 mg/dL — ABNORMAL HIGH (ref 65–99)
POTASSIUM: 2.7 mmol/L — AB (ref 3.5–5.1)
Sodium: 147 mmol/L — ABNORMAL HIGH (ref 135–145)
TOTAL PROTEIN: 6.7 g/dL (ref 6.5–8.1)

## 2015-07-14 LAB — GLUCOSE, CAPILLARY
GLUCOSE-CAPILLARY: 116 mg/dL — AB (ref 65–99)
GLUCOSE-CAPILLARY: 101 mg/dL — AB (ref 65–99)
Glucose-Capillary: 95 mg/dL (ref 65–99)
Glucose-Capillary: 133 mg/dL — ABNORMAL HIGH (ref 65–99)

## 2015-07-14 LAB — MAGNESIUM: MAGNESIUM: 2.1 mg/dL (ref 1.7–2.4)

## 2015-07-14 LAB — LACTIC ACID, PLASMA: Lactic Acid, Venous: 1.3 mmol/L (ref 0.5–2.0)

## 2015-07-14 LAB — VITAMIN B12: VITAMIN B 12: 539 pg/mL (ref 180–914)

## 2015-07-14 MED ORDER — LORAZEPAM 2 MG/ML IJ SOLN
1.0000 mg | Freq: Once | INTRAMUSCULAR | Status: AC
  Administered 2015-07-14: 1 mg via INTRAVENOUS
  Filled 2015-07-14: qty 1

## 2015-07-14 MED ORDER — PIPERACILLIN-TAZOBACTAM 3.375 G IVPB
3.3750 g | Freq: Three times a day (TID) | INTRAVENOUS | Status: DC
  Administered 2015-07-14 – 2015-07-15 (×3): 3.375 g via INTRAVENOUS
  Filled 2015-07-14 (×13): qty 50

## 2015-07-14 MED ORDER — POTASSIUM CHLORIDE CRYS ER 20 MEQ PO TBCR
20.0000 meq | EXTENDED_RELEASE_TABLET | Freq: Once | ORAL | Status: AC
  Administered 2015-07-14: 20 meq via ORAL
  Filled 2015-07-14: qty 1

## 2015-07-14 MED ORDER — SODIUM CHLORIDE 0.45 % IV SOLN
INTRAVENOUS | Status: DC
  Administered 2015-07-14 – 2015-07-15 (×3): via INTRAVENOUS
  Filled 2015-07-14 (×8): qty 1000

## 2015-07-14 MED ORDER — POTASSIUM CHLORIDE 10 MEQ/100ML IV SOLN
10.0000 meq | INTRAVENOUS | Status: AC
  Administered 2015-07-14 (×3): 10 meq via INTRAVENOUS
  Filled 2015-07-14 (×4): qty 100

## 2015-07-14 MED ORDER — POTASSIUM CHLORIDE CRYS ER 20 MEQ PO TBCR
40.0000 meq | EXTENDED_RELEASE_TABLET | Freq: Once | ORAL | Status: AC
  Administered 2015-07-14: 40 meq via ORAL
  Filled 2015-07-14: qty 2

## 2015-07-14 MED ORDER — VANCOMYCIN HCL 500 MG IV SOLR
500.0000 mg | Freq: Two times a day (BID) | INTRAVENOUS | Status: DC
  Administered 2015-07-14 (×2): 500 mg via INTRAVENOUS
  Filled 2015-07-14 (×9): qty 500

## 2015-07-14 NOTE — Progress Notes (Signed)
Dr.Jenkins gave orders to postpone antibiotics for later and to cancel pt's last run of potassium.

## 2015-07-14 NOTE — Progress Notes (Signed)
EEG Completed; Results Pending  

## 2015-07-14 NOTE — Procedures (Signed)
  [place] A. Merlene Laughter, MD     [email or web address]           HISTORY: The patient is a 81 year old female who presents with confusion and altered mental status. This has been done to evaluate for seizures.  MEDICATIONS: Scheduled Meds: . amLODipine  10 mg Oral Daily  . enoxaparin (LOVENOX) injection  40 mg Subcutaneous Q24H  . insulin aspart  0-5 Units Subcutaneous QHS  . insulin aspart  0-9 Units Subcutaneous TID WC  . piperacillin-tazobactam (ZOSYN)  IV  3.375 g Intravenous Q8H  . potassium chloride  20 mEq Oral Daily  . sodium chloride  3 mL Intravenous Q12H  . vancomycin  500 mg Intravenous Q12H   Continuous Infusions: . sodium chloride 0.45 % 1,000 mL with potassium chloride 40 mEq infusion 75 mL/hr at 07/14/15 1054   PRN Meds:.acetaminophen **OR** acetaminophen, ondansetron **OR** ondansetron (ZOFRAN) IV  Prior to Admission medications   Medication Sig Start Date End Date Taking? Authorizing Provider  amLODipine (NORVASC) 10 MG tablet Take 10 mg by mouth daily.    Yes Historical Provider, MD  dicyclomine (BENTYL) 10 MG capsule Take 10 mg by mouth 4 (four) times daily as needed for spasms.    Yes Historical Provider, MD  docusate sodium (COLACE) 100 MG capsule Take 100 mg by mouth 3 (three) times daily.   Yes Historical Provider, MD  donepezil (ARICEPT) 10 MG tablet Take 10 mg by mouth at bedtime.   Yes Historical Provider, MD  hydrochlorothiazide 25 MG tablet Take 12.5 mg by mouth every morning.    Yes Historical Provider, MD  metFORMIN (GLUCOPHAGE) 500 MG tablet Take 500 mg by mouth daily with breakfast.    Yes Historical Provider, MD  NAMENDA 5 MG tablet Take 5 mg by mouth 2 (two) times daily.  12/09/13  Yes Historical Provider, MD  niacin (NIASPAN) 500 MG CR tablet Take 500 mg by mouth daily.  12/23/13  Yes Historical Provider, MD  Omega-3 Fatty Acids (FISH OIL) 1000 MG CAPS Take 1 capsule by mouth daily.    Yes Historical Provider, MD  potassium  chloride (KLOR-CON) 20 MEQ packet Take 20 mEq by mouth daily.   Yes Historical Provider, MD      ANALYSIS: A 16 channel recording using standard 10 20 measurements is conducted for 22 minutes. The background activity gases high as 6 Hz although there is slower activity is observed from time to time. There are prominent spindles indicating stage II sleep. There is beta activity observed in the frontal areas. Photic simulation and hyperventilation are not carried out. There is no focal or lateral slowing. There is no epileptiform activity is observed.   IMPRESSION: This recording shows moderate global slowing. However, there is no epileptiform activities observed.      Parag Dorton A. Merlene Laughter, M.D.  Diplomate, Tax adviser of Psychiatry and Neurology ( Neurology).

## 2015-07-14 NOTE — Progress Notes (Signed)
Pt's IV has infiltrated and there has been four unsuccessful attempts. Paged Dr.Jenkins. There are no new orders at this time.

## 2015-07-14 NOTE — Care Management Note (Signed)
Case Management Note  Patient Details  Name: Taylor Andrews MRN: [id number] Date of Birth: 05/25/1934   Expected Discharge Date:    07/17/2015              Expected Discharge Plan:  Assisted Living / Rest Home  In-House Referral:  Clinical Social Work  Discharge planning Services  CM Consult  Post Acute Care Choice:  NA Choice offered to:  NA  DME Arranged:    DME Agency:     HH Arranged:    [place] Agency:     Status of Service:  In process, will continue to follow  Medicare Important Message Given:    Date Medicare IM Given:    Medicare IM give by:    Date Additional Medicare IM Given:    Additional Medicare Important Message give by:     If discussed at [place] of Stay Meetings, dates discussed:    Additional Comments: [name]. Pt admitted with asp PNA. Pt plans to return to [facility] at [place] is aware and will arrange for return to facility. No CM needs anticipated, will cont to follow.  Sherald Barge, RN 07/14/2015, 2:14 PM

## 2015-07-14 NOTE — Clinical Social Work Note (Signed)
Clinical Social Work Assessment  Patient Details  Name: Taylor Andrews MRN: [id number] Date of Birth: [birth year]  Date of referral:  07/14/15               Reason for consult:  Facility Placement                Permission sought to share information with:    Permission granted to share information::     Name::        Agency::     Relationship::     Contact Information:     Housing/Transportation Living arrangements for the past 2 months:  [facility] Information:  Other (Comment Required) (Patient's neice, Veneda Melter. ) Patient Interpreter Needed:  None Criminal Activity/Legal Involvement Pertinent to Current Situation/Hospitalization:  No - Comment as needed Significant Relationships:  Other Family Members Lives with:  Facility Resident Do you feel safe going back to the place where you live?  Yes Need for family participation in patient care:  No (Coment)  Care giving concerns:  Facility resident.    Social Worker assessment / plan: Patient's niece, Veneda Melter, advised that patient has been a resident at [facility] for almost two years.  She stated that patient ambulates unassisted and does not require assistance with ADL's.  Ms. Andree Elk reported that she visits patient often, picks her up for church every Sunday and takes her to lunch some days during the week.  She stated that patient has three sons who live in [place], but don't get to visits often.  She advised that she desired for patient to go back to the facility upon discharge.  Ms. Andree Elk indicated that when patient lived alone as her dementia progressed she was no longer able to live alone due to safety reasons.    Employment status:  Retired Nurse, adult PT Recommendations:  Not assessed at this time Information / Referral to community resources:     Patient/Family's Response to care: Family is agreeable to patient returning to the facility.    Patient/Family's Understanding of and Emotional Response to Diagnosis, Current Treatment, and Prognosis: Family demonstrates understanding of patient's diagnosis, treatment and prognosis.   Emotional Assessment Appearance:  Developmentally appropriate Attitude/Demeanor/Rapport:   (Cooperative) Affect (typically observed):  Calm Orientation:  Oriented to Self Alcohol / Substance use:  Not Applicable Psych involvement (Current and /or in the community):  No (Comment)  Discharge Needs  Concerns to be addressed:  Discharge Planning Concerns Readmission within the last 30 days:  No Current discharge risk:  None Barriers to Discharge:  No Barriers Identified   Ihor Gully, LCSW 07/14/2015, 12:06 PM [phone]

## 2015-07-14 NOTE — Progress Notes (Addendum)
Critical potassium of 2.8. Page D.r Taylor Andrews who put in for 4 runs to be given. Will continue to monitor

## 2015-07-14 NOTE — Consult Note (Signed)
[place] A. Merlene Laughter, MD     [email or web address]          Taylor Andrews is an 81 y.o. female.   ASSESSMENT/PLAN: Acute encephalopathy of unclear etiology. Patient does have pneumonia which possibly could explain the encephalopathy. Given the acute nature one wonders about large vessel ischemic stroke although not seen on CT scan. I think we should repeat imaging. A repeat CT scan should suffice especially since she cannot stay still for the MRI.  The patient is a 81 year old black female who is relatively functional at baseline. She stays at home and has relatives mostly nieces help with her. She has 3 sons who live in [place]. The patient woke up with confusion, altered mental status, tactile and visual hallucinations on yesterday morning. Again, baseline she functions decently and has decent cognition although she is on Aricept. Evaluation shows no acute changes on CT scan. She does have a history of right frontotemporal subdural hematoma requiring surgery about a year ago. There is residual chronic extra axil full collection that is unchanged on imaging. The patient was noted to have evidence of mild pneumonia although no coughing, fevers or shortness of breath has been reported. Family reports no new medications. No focal neurological symptoms or deficits reported. No reports of chest pain or shortness of breath. Review of systems otherwise negative. The family reports that she continues to be confused with tactile hallucinations and visual hallucinations while hospitalized. She is talking to people who have passed.   GENERAL: She is in no acute distress.  HEENT: Supple. Atraumatic normocephalic.   ABDOMEN: soft  EXTREMITIES: No edema   BACK: Normal.  SKIN: Normal by inspection.    MENTAL STATUS: Quite drowsy and the stuporous. She does open her eyes to deep painful stimuli. She requires constant stimulation. She did awaken for a few minutes and follow  commands well. She was totally disoriented however.  CRANIAL NERVES: Pupils are pinpoint but reactive bilaterally. Equal, round and reactive to light and accommodation; extra ocular movements are full, there is no significant nystagmus; visual fields are full; upper and lower facial muscles are normal in strength and symmetric, there is no flattening of the nasolabial folds; tongue is midline.  MOTOR: She has normal tone and bulk and strength of the upper extremities is. She has at least antigravity strength in the legs.  COORDINATION: There are no tremors or dysmetria.   REFLEXES: Deep tendon reflexes are symmetrical and normal. Babinski reflexes are flexor bilaterally.   SENSATION: Normal to light touch and pain.    Blood pressure 123/74, pulse 89, temperature 98 F (36.7 C), temperature source Oral, resp. rate 18, height '5\' 6"'  (1.676 m), weight 61.825 kg (136 lb 4.8 oz), SpO2 99 %.  Past Medical History  Diagnosis Date  . Diabetes mellitus   . Hypertension   . Hyperlipidemia   . Fatty liver     on [place] normal LFT'S  . Iron deficiency     Chronic current parameter normal; 4/09 iron was 12 sat 5%  . GERD (gastroesophageal reflux disease)   . Adenomatous colon polyp     carcinoma in situ  . Dementia   . Chronic abdominal pain   . SDH (subdural hematoma) 01/2014    chronic    Past Surgical History  Procedure Laterality Date  . Colonoscopy  8/05    internal hemorrhoids,polyp bengin  . Esophagogastroduodenoscopy  6/05    tiny antral erosin with some deformity of pyloric channel  .  Vesicovaginal fistula closure w/ tah    . Ankle surgery      Left ;pins  . Colonoscopy  01/12/2011    ZOX:WRUEAV rectum/ 5-mm polyp at the splenic flexure s/p polypectomy, tattooing, and scarred hepatic flexure, absolutely no evidence of any residual polyp tissue or neoplasm/Scattered left-sided diverticula. Tubular adenoma, no high-grade dysplasia.Repeat TCS due 01/2016  . Colonoscopy  06/16/2009     WUJ:WJXBJYNW hemorrhoids, otherwise normal rectum.Flat polypoid lesion at the hepatic flexure, debulked with piecemeal snare polypectomy.  This area was also tattooed Transverse colon and descending colon  flat adenomatous appearing polyps resected with hot snare cautery as well. Tubulovillous adenomas  . Colonoscopy  December 2010    Dr. Gala Romney: Flat sessile hepatic flexure polyp status post saline-assisted piecemeal polypectomy. Fragments of adenomatous polyp with high-grade dysplasia.  . Colonoscopy  March 2011    Dr. Gala Romney: Minimal persisting polypoid tissue at site of previous piecemeal polypectomy status post hot snare polypectomy. Tubular adenoma, no high-grade dysplasia  . Abdominal hysterectomy    . Craniotomy Right 01/23/2014    Procedure: CRANIOTOMY HEMATOMA EVACUATION SUBDURAL;  Surgeon: Eustace Moore, MD;  Location: [facility];  Service: Neurosurgery;  Laterality: Right;    Family History  Problem Relation Age of Onset  . Lupus Sister   . Other Sister     ? Liver cancer    Social History:  reports that she has never smoked. She does not have any smokeless tobacco history on file. She reports that she does not drink alcohol or use illicit drugs.  Allergies:  Allergies  Allergen Reactions  . Iohexol      Desc: PT STATES THAT THE CONTRAST THAT SHE HAD IN 2004 MADE HER SICK, PT COULD NOT SPECIFY REACTION TYPES   . Milk-Related Compounds     Lactose Intolerant     Medications: Prior to Admission medications   Medication Sig Start Date End Date Taking? Authorizing Provider  amLODipine (NORVASC) 10 MG tablet Take 10 mg by mouth daily.    Yes Historical Provider, MD  dicyclomine (BENTYL) 10 MG capsule Take 10 mg by mouth 4 (four) times daily as needed for spasms.    Yes Historical Provider, MD  docusate sodium (COLACE) 100 MG capsule Take 100 mg by mouth 3 (three) times daily.   Yes Historical Provider, MD  donepezil (ARICEPT) 10 MG tablet Take 10 mg by mouth at bedtime.   Yes  Historical Provider, MD  hydrochlorothiazide 25 MG tablet Take 12.5 mg by mouth every morning.    Yes Historical Provider, MD  metFORMIN (GLUCOPHAGE) 500 MG tablet Take 500 mg by mouth daily with breakfast.    Yes Historical Provider, MD  NAMENDA 5 MG tablet Take 5 mg by mouth 2 (two) times daily.  12/09/13  Yes Historical Provider, MD  niacin (NIASPAN) 500 MG CR tablet Take 500 mg by mouth daily.  12/23/13  Yes Historical Provider, MD  Omega-3 Fatty Acids (FISH OIL) 1000 MG CAPS Take 1 capsule by mouth daily.    Yes Historical Provider, MD  potassium chloride (KLOR-CON) 20 MEQ packet Take 20 mEq by mouth daily.   Yes Historical Provider, MD    Scheduled Meds: . amLODipine  10 mg Oral Daily  . enoxaparin (LOVENOX) injection  40 mg Subcutaneous Q24H  . insulin aspart  0-5 Units Subcutaneous QHS  . insulin aspart  0-9 Units Subcutaneous TID WC  . piperacillin-tazobactam (ZOSYN)  IV  3.375 g Intravenous Q8H  . potassium chloride  20  mEq Oral Daily  . sodium chloride  3 mL Intravenous Q12H  . vancomycin  500 mg Intravenous Q12H   Continuous Infusions: . sodium chloride 0.45 % 1,000 mL with potassium chloride 40 mEq infusion 75 mL/hr at 07/14/15 1054   PRN Meds:.acetaminophen **OR** acetaminophen, ondansetron **OR** ondansetron (ZOFRAN) IV     Results for orders placed or performed during the hospital encounter of 07/13/15 (from the past 48 hour(s))  CBC with Differential/Platelet     Status: Abnormal   Collection Time: 07/13/15  9:39 AM  Result Value Ref Range   WBC 7.1 4.0 - 10.5 K/uL   RBC 4.69 3.87 - 5.11 MIL/uL   Hemoglobin 13.9 12.0 - 15.0 g/dL   HCT 42.1 36.0 - 46.0 %   MCV 89.8 78.0 - 100.0 fL   MCH 29.6 26.0 - 34.0 pg   MCHC 33.0 30.0 - 36.0 g/dL   RDW 13.8 11.5 - 15.5 %   Platelets 289 150 - 400 K/uL   Neutrophils Relative % 85 (H) 43 - 77 %   Neutro Abs 6.0 1.7 - 7.7 K/uL   Lymphocytes Relative 12 12 - 46 %   Lymphs Abs 0.9 0.7 - 4.0 K/uL   Monocytes Relative 3 3 - 12 %    Monocytes Absolute 0.2 0.1 - 1.0 K/uL   Eosinophils Relative 0 0 - 5 %   Eosinophils Absolute 0.0 0.0 - 0.7 K/uL   Basophils Relative 0 0 - 1 %   Basophils Absolute 0.0 0.0 - 0.1 K/uL  Culture, blood (routine x 2)     Status: None (Preliminary result)   Collection Time: 07/13/15  9:40 AM  Result Value Ref Range   Specimen Description BLOOD RIGHT HAND    Special Requests BOTTLES DRAWN AEROBIC ONLY 5CC    Culture NO GROWTH < 24 HOURS    Report Status PENDING   Comprehensive metabolic panel     Status: Abnormal   Collection Time: 07/13/15  9:41 AM  Result Value Ref Range   Sodium 140 135 - 145 mmol/L   Potassium 3.5 3.5 - 5.1 mmol/L   Chloride 104 101 - 111 mmol/L   CO2 26 22 - 32 mmol/L   Glucose, Bld 172 (H) 65 - 99 mg/dL   BUN 13 6 - 20 mg/dL   Creatinine, Ser 0.84 0.44 - 1.00 mg/dL   Calcium 9.4 8.9 - 10.3 mg/dL   Total Protein 7.3 6.5 - 8.1 g/dL   Albumin 3.9 3.5 - 5.0 g/dL   AST 24 15 - 41 U/L   ALT 17 14 - 54 U/L   Alkaline Phosphatase 72 38 - 126 U/L   Total Bilirubin 0.7 0.3 - 1.2 mg/dL   GFR calc non Af Amer >60 >60 mL/min   GFR calc Af Amer >60 >60 mL/min    Comment: (NOTE) The eGFR has been calculated using the CKD EPI equation. This calculation has not been validated in all clinical situations. eGFR's persistently <60 mL/min signify possible Chronic Kidney Disease.    Anion gap 10 5 - 15  Troponin I     Status: None   Collection Time: 07/13/15  9:41 AM  Result Value Ref Range   Troponin I <0.03 <0.031 ng/mL    Comment:        NO INDICATION OF MYOCARDIAL INJURY.   Urinalysis, Routine w reflex microscopic (not at [facility])     Status: None   Collection Time: 07/13/15 10:24 AM  Result Value Ref Range  Color, Urine YELLOW YELLOW   APPearance CLEAR CLEAR   Specific Gravity, Urine 1.010 1.005 - 1.030   pH 7.0 5.0 - 8.0   Glucose, UA NEGATIVE NEGATIVE mg/dL   Hgb urine dipstick NEGATIVE NEGATIVE   Bilirubin Urine NEGATIVE NEGATIVE   Ketones, ur NEGATIVE  NEGATIVE mg/dL   Protein, ur NEGATIVE NEGATIVE mg/dL   Urobilinogen, UA 0.2 0.0 - 1.0 mg/dL   Nitrite NEGATIVE NEGATIVE   Leukocytes, UA NEGATIVE NEGATIVE    Comment: MICROSCOPIC NOT DONE ON URINES WITH NEGATIVE PROTEIN, BLOOD, LEUKOCYTES, NITRITE, OR GLUCOSE <1000 mg/dL.  Urine culture     Status: None   Collection Time: 07/13/15 10:24 AM  Result Value Ref Range   Specimen Description URINE, CATHETERIZED    Special Requests NONE    Culture      NO GROWTH 1 DAY Performed at [facility]    Report Status 07/14/2015 FINAL   Culture, blood (routine x 2)     Status: None (Preliminary result)   Collection Time: 07/13/15 10:40 AM  Result Value Ref Range   Specimen Description BLOOD RIGHT ARM    Special Requests BOTTLES DRAWN AEROBIC ONLY 4CC    Culture NO GROWTH < 24 HOURS    Report Status PENDING   I-Stat CG4 Lactic Acid, ED     Status: Abnormal   Collection Time: 07/13/15 10:51 AM  Result Value Ref Range   Lactic Acid, Venous 3.01 (HH) 0.5 - 2.0 mmol/L   Comment NOTIFIED PHYSICIAN   Blood gas, arterial     Status: Abnormal   Collection Time: 07/13/15  3:10 PM  Result Value Ref Range   FIO2 21.00    Delivery systems ROOM AIR    pH, Arterial 7.477 (H) 7.350 - 7.450   pCO2 arterial 35.0 35.0 - 45.0 mmHg   pO2, Arterial 86.6 80.0 - 100.0 mmHg   Bicarbonate 26.8 (H) 20.0 - 24.0 mEq/L   Acid-Base Excess 2.2 (H) 0.0 - 2.0 mmol/L   O2 Saturation 96.5 %   Collection site RIGHT BRACHIAL    Drawn by [phone]    Sample type ARTERIAL   Lactic acid, plasma     Status: Abnormal   Collection Time: 07/13/15  4:41 PM  Result Value Ref Range   Lactic Acid, Venous 2.8 (HH) 0.5 - 2.0 mmol/L    Comment: CRITICAL RESULT CALLED TO, READ BACK BY AND VERIFIED WITH: KNIGHT,C ON 07/13/15 AT 1710 BY LOY,C   Procalcitonin     Status: None   Collection Time: 07/13/15  6:00 PM  Result Value Ref Range   Procalcitonin <0.10 ng/mL    Comment:        Interpretation: PCT (Procalcitonin) <= 0.5  ng/mL: Systemic infection (sepsis) is not likely. Local bacterial infection is possible. (NOTE)         ICU PCT Algorithm               Non ICU PCT Algorithm    ----------------------------     ------------------------------         PCT < 0.25 ng/mL                 PCT < 0.1 ng/mL     Stopping of antibiotics            Stopping of antibiotics       strongly encouraged.               strongly encouraged.    ----------------------------     ------------------------------  PCT level decrease by               PCT < 0.25 ng/mL       >= 80% from peak PCT       OR PCT 0.25 - 0.5 ng/mL          Stopping of antibiotics                                             encouraged.     Stopping of antibiotics           encouraged.    ----------------------------     ------------------------------       PCT level decrease by              PCT >= 0.25 ng/mL       < 80% from peak PCT        AND PCT >= 0.5 ng/mL            Continuin g antibiotics                                              encouraged.       Continuing antibiotics            encouraged.    ----------------------------     ------------------------------     PCT level increase compared          PCT > 0.5 ng/mL         with peak PCT AND          PCT >= 0.5 ng/mL             Escalation of antibiotics                                          strongly encouraged.      Escalation of antibiotics        strongly encouraged.   Protime-INR     Status: None   Collection Time: 07/13/15  6:00 PM  Result Value Ref Range   Prothrombin Time 14.0 11.6 - 15.2 seconds   INR 1.06 0.00 - 1.49  APTT     Status: None   Collection Time: 07/13/15  6:00 PM  Result Value Ref Range   aPTT 24 24 - 37 seconds  TSH     Status: None   Collection Time: 07/13/15  6:00 PM  Result Value Ref Range   TSH 1.074 0.350 - 4.500 uIU/mL  Ammonia     Status: None   Collection Time: 07/13/15  6:00 PM  Result Value Ref Range   Ammonia 17 9 - 35 umol/L  Lactic acid,  plasma     Status: None   Collection Time: 07/13/15  7:49 PM  Result Value Ref Range   Lactic Acid, Venous 2.0 0.5 - 2.0 mmol/L  Vitamin B12     Status: None   Collection Time: 07/13/15  7:49 PM  Result Value Ref Range   Vitamin B-12 539 180 - 914 pg/mL    Comment: (NOTE) This assay is not validated for testing neonatal or myeloproliferative syndrome specimens for Vitamin B12 levels.  Performed at [facility]   Glucose, capillary     Status: Abnormal   Collection Time: 07/13/15  8:20 PM  Result Value Ref Range   Glucose-Capillary 148 (H) 65 - 99 mg/dL   Comment 1 Notify RN    Comment 2 Document in Chart   Lactic acid, plasma     Status: None   Collection Time: 07/13/15 10:07 PM  Result Value Ref Range   Lactic Acid, Venous 2.0 0.5 - 2.0 mmol/L  Comprehensive metabolic panel     Status: Abnormal   Collection Time: 07/14/15  1:30 AM  Result Value Ref Range   Sodium 147 (H) 135 - 145 mmol/L   Potassium 2.7 (LL) 3.5 - 5.1 mmol/L    Comment: DELTA CHECK NOTED CRITICAL RESULT CALLED TO, READ BACK BY AND VERIFIED WITH: MCKINNEY S AT 0156 ON [id number] BY FORSYTH K    Chloride 113 (H) 101 - 111 mmol/L   CO2 26 22 - 32 mmol/L   Glucose, Bld 142 (H) 65 - 99 mg/dL   BUN 8 6 - 20 mg/dL   Creatinine, Ser 0.64 0.44 - 1.00 mg/dL   Calcium 8.8 (L) 8.9 - 10.3 mg/dL   Total Protein 6.7 6.5 - 8.1 g/dL   Albumin 3.6 3.5 - 5.0 g/dL   AST 29 15 - 41 U/L   ALT 18 14 - 54 U/L   Alkaline Phosphatase 65 38 - 126 U/L   Total Bilirubin 0.6 0.3 - 1.2 mg/dL   GFR calc non Af Amer >60 >60 mL/min   GFR calc Af Amer >60 >60 mL/min    Comment: (NOTE) The eGFR has been calculated using the CKD EPI equation. This calculation has not been validated in all clinical situations. eGFR's persistently <60 mL/min signify possible Chronic Kidney Disease.    Anion gap 8 5 - 15  CBC     Status: None   Collection Time: 07/14/15  1:30 AM  Result Value Ref Range   WBC 7.4 4.0 - 10.5 K/uL   RBC 4.39 3.87 -  5.11 MIL/uL   Hemoglobin 13.0 12.0 - 15.0 g/dL   HCT 39.4 36.0 - 46.0 %   MCV 89.7 78.0 - 100.0 fL   MCH 29.6 26.0 - 34.0 pg   MCHC 33.0 30.0 - 36.0 g/dL   RDW 13.7 11.5 - 15.5 %   Platelets 273 150 - 400 K/uL  Lactic acid, plasma     Status: None   Collection Time: 07/14/15  1:30 AM  Result Value Ref Range   Lactic Acid, Venous 1.3 0.5 - 2.0 mmol/L  Magnesium     Status: None   Collection Time: 07/14/15  1:30 AM  Result Value Ref Range   Magnesium 2.1 1.7 - 2.4 mg/dL  Glucose, capillary     Status: Abnormal   Collection Time: 07/14/15  7:08 AM  Result Value Ref Range   Glucose-Capillary 116 (H) 65 - 99 mg/dL   Comment 1 Notify RN    Comment 2 Document in Chart   Glucose, capillary     Status: None   Collection Time: 07/14/15 11:12 AM  Result Value Ref Range   Glucose-Capillary 95 65 - 99 mg/dL   Comment 1 Notify RN    Comment 2 Document in Chart   Glucose, capillary     Status: Abnormal   Collection Time: 07/14/15  4:27 PM  Result Value Ref Range   Glucose-Capillary 101 (H) 65 - 99 mg/dL   Comment 1 Notify  RN    Comment 2 Document in Chart     Studies/Results:  HEAD CT  No acute intracranial abnormality. Stable cerebral atrophy. Stable 4 mm right frontoparietal chronic subdural collection.   Mareta Chesnut A. Merlene Laughter, M.D.  Diplomate, Tax adviser of Psychiatry and Neurology ( Neurology). 07/14/2015, 5:57 PM

## 2015-07-15 ENCOUNTER — Inpatient Hospital Stay (HOSPITAL_COMMUNITY): Payer: Medicare Other

## 2015-07-15 DIAGNOSIS — E119 Type 2 diabetes mellitus without complications: Secondary | ICD-10-CM

## 2015-07-15 DIAGNOSIS — E876 Hypokalemia: Secondary | ICD-10-CM

## 2015-07-15 LAB — BASIC METABOLIC PANEL
ANION GAP: 8 (ref 5–15)
BUN: 9 mg/dL (ref 6–20)
CALCIUM: 9.7 mg/dL (ref 8.9–10.3)
CO2: 24 mmol/L (ref 22–32)
CREATININE: 0.88 mg/dL (ref 0.44–1.00)
Chloride: 109 mmol/L (ref 101–111)
Glucose, Bld: 104 mg/dL — ABNORMAL HIGH (ref 65–99)
Potassium: 4.2 mmol/L (ref 3.5–5.1)
SODIUM: 141 mmol/L (ref 135–145)

## 2015-07-15 LAB — CBC
HCT: 47.3 % — ABNORMAL HIGH (ref 36.0–46.0)
Hemoglobin: 15.4 g/dL — ABNORMAL HIGH (ref 12.0–15.0)
MCH: 29.5 pg (ref 26.0–34.0)
MCHC: 32.6 g/dL (ref 30.0–36.0)
MCV: 90.6 fL (ref 78.0–100.0)
PLATELETS: 335 10*3/uL (ref 150–400)
RBC: 5.22 MIL/uL — AB (ref 3.87–5.11)
RDW: 14.1 % (ref 11.5–15.5)
WBC: 7.2 10*3/uL (ref 4.0–10.5)

## 2015-07-15 LAB — MAGNESIUM: MAGNESIUM: 2.4 mg/dL (ref 1.7–2.4)

## 2015-07-15 LAB — GLUCOSE, CAPILLARY
GLUCOSE-CAPILLARY: 85 mg/dL (ref 65–99)
GLUCOSE-CAPILLARY: 103 mg/dL — AB (ref 65–99)
GLUCOSE-CAPILLARY: 122 mg/dL — AB (ref 65–99)
Glucose-Capillary: 87 mg/dL (ref 65–99)

## 2015-07-15 MED ORDER — HALOPERIDOL LACTATE 5 MG/ML IJ SOLN
5.0000 mg | Freq: Four times a day (QID) | INTRAMUSCULAR | Status: DC | PRN
  Filled 2015-07-15 (×2): qty 1

## 2015-07-15 MED ORDER — AMOXICILLIN-POT CLAVULANATE 875-125 MG PO TABS
1.0000 | ORAL_TABLET | Freq: Two times a day (BID) | ORAL | Status: DC
  Administered 2015-07-15 – 2015-07-16 (×3): 1 via ORAL
  Filled 2015-07-15 (×3): qty 1

## 2015-07-15 NOTE — Plan of Care (Signed)
Pt continues to be impulsive and only A/O self.  Bed alarm on and family contacted, willing to come in and sit with patient.

## 2015-07-15 NOTE — Plan of Care (Signed)
Family currently back in room and pt is conversing.  RN decided not to administer Haldol - so long as family is visiting and pt is safe.

## 2015-07-15 NOTE — Care Management Note (Signed)
Case Management Note  Patient Details  Name: Taylor Andrews MRN: [id number] Date of Birth: 08/17/1934  Expected Discharge Date:    07/16/2015              Expected Discharge Plan:  Assisted Living / Rest Home  In-House Referral:  Clinical Social Work  Discharge planning Services  CM Consult  Post Acute Care Choice:  NA Choice offered to:  NA  DME Arranged:    DME Agency:     HH Arranged:    [place] Agency:     Status of Service:  Completed, signed off  Medicare Important Message Given:  Yes-second notification given Date Medicare IM Given:    Medicare IM give by:    Date Additional Medicare IM Given:    Additional Medicare Important Message give by:     If discussed at [place] of Stay Meetings, dates discussed:    Additional Comments: Anticipate DC to ALF this weekend. CSW will arrange for return to facility. No CM needs.  Sherald Barge, RN 07/15/2015, 3:33 PM

## 2015-07-15 NOTE — Care Management Important Message (Signed)
Important Message  Patient Details  Name: Taylor Andrews MRN: [id number] Date of Birth: Jun 11, 1934   Medicare Important Message Given:  Yes-second notification given    Sherald Barge, RN 07/15/2015, 3:33 PM

## 2015-07-15 NOTE — Plan of Care (Signed)
Family has been with pt all day but left for home about 30 minutes ago ans stated not able to stay for night. Pt has attempting to get out of bed 3X since and even climbed over railing to do so.  Pt is cursing at staff and is only oriented to staff.  Pt not currently able to follow tasks and is not safe.  Bed alarm and fall precautions in place. Dr. Kandice Robinsons to inform and ask for possible PRN meds to assist. [facility] also notified of possible need for sitter.

## 2015-07-15 NOTE — Progress Notes (Signed)
[facility] PROGRESS NOTE  Taylor Andrews UDJ:[id number] DOB: February 03, 1934 DOA: 07/13/2015 PCP: Glo Herring., MD  Assessment/Plan: 1. Sepsis likely related to PNA.  Pt received IV fluids per sepsis protocol. Lactic acid has trended down. She is on broad-spectrum antibiotics. Cultures show no growth to date. Urinalysis is unremarkable and she does not have any neck stiffness. She is hemodynamically stable and afebrile. Will deescalate abx to Augmentin. Will continue to follow.   2. Aspiration pneumonia. Found on chest x-ray. History of vomiting while sleeping prior to arrival. She does not appear to be significantly short of breath or coughing. Will deescalate abx to Augmentin. Have low threshold to discontinue antibiotics as her clinical condition improves.  3. Acute encephalopathy. Etiology is not entirely clear. ABG is unremarkable. Possibly related to pneumonia. Remains afebrile. She does not appear to have any gross focal deficits. She has not been started on any new medications. Will continue to monitor at this time. Initial and repeat CT head was unremarkable for acute changes. EEG did not show any epileptiform discharges. Other metabolic workup has been unrevealing. Mental status appears to be slowly improving back to baseline. Neurology is following patient. Will continue to monitor for another 24hrs.   4. Diabetes. Will continue to hold metformin. Continue SSI. 5. Hypertension. Will continue outpatient regimen. 6. Dementia. Will continue Namenda and Aricept on discharge. 7. Hypokalemia. Replaced. Magnesium is normal.  Code Status: Full DVT Prophylaxis: Lovenox Family Communication: Family at bedside.  Disposition Plan: Discharge upon improvement hopefully in the next 24hrs.    Consultants:  Neurology  Procedures: EEG revealed moderate global slowing. However, no epileptiform activities observed   Antibiotics:  Vancomycin 9/7>>9/9  Zosyn 9/7>>9/9  Augmentin  9/9>>  HPI/Subjective: Per daughter she can understand her words better but she is unable to understand what she is saying.  Feeling better. No reports of chest pain, shortness of breath, n/v/d, or abd pain.   Objective: Filed Vitals:   07/15/15 0850  BP: 138/75  Pulse:   Temp:   Resp:     Intake/Output Summary (Last 24 hours) at 07/15/15 1133 Last data filed at 07/15/15 0815  Gross per 24 hour  Intake   2255 ml  Output    200 ml  Net   2055 ml   Filed Weights   07/13/15 0859 07/13/15 1313  Weight: 59.421 kg (131 lb) 61.825 kg (136 lb 4.8 oz)    Exam: General: NAD, looks comfortable Cardiovascular: RRR, S1, S2  Respiratory: clear bilaterally, No wheezing, rales or rhnochi Abdomen: soft, non tender, no distention , bowel sounds normal Musculoskeletal: No edema b/l.   Data Reviewed: Basic Metabolic Panel:  Recent Labs Lab 07/13/15 0941 07/14/15 0130 07/15/15 0830  NA 140 147* 141  K 3.5 2.7* 4.2  CL 104 113* 109  CO2 26 26 24   GLUCOSE 172* 142* 104*  BUN 13 8 9   CREATININE 0.84 0.64 0.88  CALCIUM 9.4 8.8* 9.7  MG  --  2.1 2.4   Liver Function Tests:  Recent Labs Lab 07/13/15 0941 07/14/15 0130  AST 24 29  ALT 17 18  ALKPHOS 72 65  BILITOT 0.7 0.6  PROT 7.3 6.7  ALBUMIN 3.9 3.6   CBC:  Recent Labs Lab 07/13/15 0939 07/14/15 0130 07/15/15 0830  WBC 7.1 7.4 7.2  NEUTROABS 6.0  --   --   HGB 13.9 13.0 15.4*  HCT 42.1 39.4 47.3*  MCV 89.8 89.7 90.6  PLT 289 273 335   Cardiac Enzymes:  Recent Labs Lab 07/13/15 0941  TROPONINI <0.03   BNP (last 3 results)  ProBNP (last 3 results)  CBG:   Studies: No results found.  Scheduled Meds: . amLODipine  10 mg Oral Daily  . enoxaparin (LOVENOX) injection  40 mg Subcutaneous Q24H  . insulin aspart  0-5 Units Subcutaneous QHS  . insulin aspart  0-9 Units Subcutaneous TID WC  . piperacillin-tazobactam (ZOSYN)  IV  3.375 g Intravenous Q8H  . potassium chloride  20 mEq Oral Daily  .  sodium chloride  3 mL Intravenous Q12H  . vancomycin  500 mg Intravenous Q12H   Continuous Infusions: . sodium chloride 0.45 % 1,000 mL with potassium chloride 40 mEq infusion 75 mL/hr at 07/14/15 2339    Active Problems:   Aspiration pneumonia   Acute encephalopathy   Sepsis   Dementia   HTN (hypertension)   DM (diabetes mellitus), type 2   Encephalopathy   Hypokalemia   Encephalopathy acute    Time spent: 25 minutes   By signing my name below, I, Rennis Harding, attest that this documentation has been prepared under the direction and in the presence of Kathie Dike, MD. Electronically signed: Rennis Harding, Scribe. 07/15/2015 11:35am  Kathie Dike, M.D.  [facility] Pager [phone]. If 7PM-7AM, please contact night-coverage at [email or web address], password [facility] 07/15/2015, 11:33 AM  LOS: 2 days     I have reviewed the above documentation for accuracy and completeness, and I agree with the above.  Kathie Dike, MD

## 2015-07-15 NOTE — Progress Notes (Signed)
Patient ID: Taylor Andrews, female   DOB: Mar 14, 1934, 81 y.o.   MRN: [id number]  Stacy A. Merlene Laughter, MD     [email or web address]          Taylor Andrews is an 81 y.o. female.   Assessment/Plan: Resolved Acute encephalopathy likely due to pneumonia.  Much improved - likely at baseline    GENERAL: She is in no acute distress.  HEENT: Supple. Atraumatic normocephalic.   ABDOMEN: soft  EXTREMITIES: No edema   BACK: Normal.  SKIN: Normal by inspection.   MENTAL STATUS: Awake and alert. Oriented to hospital. Speaks in simple sentnces and follows commands.  CRANIAL NERVES: Pupils are pinpoint but reactive bilaterally. Equal, round and reactive to light and accommodation; extra ocular movements are full, there is no significant nystagmus; visual fields are full; upper and lower facial muscles are normal in strength and symmetric, there is no flattening of the nasolabial folds; tongue is midline.  MOTOR: She has normal tone and bulk and strength of the upper extremities is. She has at least antigravity strength in the legs.  COORDINATION: There are no tremors or dysmetria.   HEAD CT unchanged.      Objective: Vital signs in last 24 hours: Temp:  [98.2 F (36.8 C)-98.4 F (36.9 C)] 98.2 F (36.8 C) (09/09 1448) Pulse Rate:  [76-82] 82 (09/09 1448) Resp:  [18-20] 18 (09/09 1448) BP: (113-138)/(51-75) 113/51 mmHg (09/09 1448) SpO2:  [98 %-100 %] 100 % (09/09 1448)  Intake/Output from previous day: 09/08 0701 - 09/09 0700 In: 2495 [P.O.:720; I.V.:1475; IV Piggyback:300] Out: -  Intake/Output this shift:   Nutritional status: Diet heart healthy/carb modified Room service appropriate?: Yes; Fluid consistency:: Thin   Lab Results: Results for orders placed or performed during the hospital encounter of 07/13/15 (from the past 48 hour(s))  Lactic acid, plasma     Status: None   Collection Time: 07/13/15 10:07 PM  Result Value Ref Range   Lactic Acid,  Venous 2.0 0.5 - 2.0 mmol/L  Comprehensive metabolic panel     Status: Abnormal   Collection Time: 07/14/15  1:30 AM  Result Value Ref Range   Sodium 147 (H) 135 - 145 mmol/L   Potassium 2.7 (LL) 3.5 - 5.1 mmol/L    Comment: DELTA CHECK NOTED CRITICAL RESULT CALLED TO, READ BACK BY AND VERIFIED WITH: MCKINNEY S AT 0156 ON [id number] BY FORSYTH K    Chloride 113 (H) 101 - 111 mmol/L   CO2 26 22 - 32 mmol/L   Glucose, Bld 142 (H) 65 - 99 mg/dL   BUN 8 6 - 20 mg/dL   Creatinine, Ser 0.64 0.44 - 1.00 mg/dL   Calcium 8.8 (L) 8.9 - 10.3 mg/dL   Total Protein 6.7 6.5 - 8.1 g/dL   Albumin 3.6 3.5 - 5.0 g/dL   AST 29 15 - 41 U/L   ALT 18 14 - 54 U/L   Alkaline Phosphatase 65 38 - 126 U/L   Total Bilirubin 0.6 0.3 - 1.2 mg/dL   GFR calc non Af Amer >60 >60 mL/min   GFR calc Af Amer >60 >60 mL/min    Comment: (NOTE) The eGFR has been calculated using the CKD EPI equation. This calculation has not been validated in all clinical situations. eGFR's persistently <60 mL/min signify possible Chronic Kidney Disease.    Anion gap 8 5 - 15  CBC     Status: None   Collection Time: 07/14/15  1:30 AM  Result Value  Ref Range   WBC 7.4 4.0 - 10.5 K/uL   RBC 4.39 3.87 - 5.11 MIL/uL   Hemoglobin 13.0 12.0 - 15.0 g/dL   HCT 39.4 36.0 - 46.0 %   MCV 89.7 78.0 - 100.0 fL   MCH 29.6 26.0 - 34.0 pg   MCHC 33.0 30.0 - 36.0 g/dL   RDW 13.7 11.5 - 15.5 %   Platelets 273 150 - 400 K/uL  Lactic acid, plasma     Status: None   Collection Time: 07/14/15  1:30 AM  Result Value Ref Range   Lactic Acid, Venous 1.3 0.5 - 2.0 mmol/L  Magnesium     Status: None   Collection Time: 07/14/15  1:30 AM  Result Value Ref Range   Magnesium 2.1 1.7 - 2.4 mg/dL  Glucose, capillary     Status: Abnormal   Collection Time: 07/14/15  7:08 AM  Result Value Ref Range   Glucose-Capillary 116 (H) 65 - 99 mg/dL   Comment 1 Notify RN    Comment 2 Document in Chart   Glucose, capillary     Status: None   Collection Time:  07/14/15 11:12 AM  Result Value Ref Range   Glucose-Capillary 95 65 - 99 mg/dL   Comment 1 Notify RN    Comment 2 Document in Chart   Glucose, capillary     Status: Abnormal   Collection Time: 07/14/15  4:27 PM  Result Value Ref Range   Glucose-Capillary 101 (H) 65 - 99 mg/dL   Comment 1 Notify RN    Comment 2 Document in Chart   Glucose, capillary     Status: Abnormal   Collection Time: 07/14/15  8:41 PM  Result Value Ref Range   Glucose-Capillary 133 (H) 65 - 99 mg/dL   Comment 1 Notify RN    Comment 2 Document in Chart   Glucose, capillary     Status: None   Collection Time: 07/15/15  8:13 AM  Result Value Ref Range   Glucose-Capillary 85 65 - 99 mg/dL  Basic metabolic panel     Status: Abnormal   Collection Time: 07/15/15  8:30 AM  Result Value Ref Range   Sodium 141 135 - 145 mmol/L   Potassium 4.2 3.5 - 5.1 mmol/L    Comment: DELTA CHECK NOTED   Chloride 109 101 - 111 mmol/L   CO2 24 22 - 32 mmol/L   Glucose, Bld 104 (H) 65 - 99 mg/dL   BUN 9 6 - 20 mg/dL   Creatinine, Ser 0.88 0.44 - 1.00 mg/dL   Calcium 9.7 8.9 - 10.3 mg/dL   GFR calc non Af Amer >60 >60 mL/min   GFR calc Af Amer >60 >60 mL/min    Comment: (NOTE) The eGFR has been calculated using the CKD EPI equation. This calculation has not been validated in all clinical situations. eGFR's persistently <60 mL/min signify possible Chronic Kidney Disease.    Anion gap 8 5 - 15  Magnesium     Status: None   Collection Time: 07/15/15  8:30 AM  Result Value Ref Range   Magnesium 2.4 1.7 - 2.4 mg/dL  CBC     Status: Abnormal   Collection Time: 07/15/15  8:30 AM  Result Value Ref Range   WBC 7.2 4.0 - 10.5 K/uL   RBC 5.22 (H) 3.87 - 5.11 MIL/uL   Hemoglobin 15.4 (H) 12.0 - 15.0 g/dL   HCT 47.3 (H) 36.0 - 46.0 %   MCV 90.6 78.0 - 100.0  fL   MCH 29.5 26.0 - 34.0 pg   MCHC 32.6 30.0 - 36.0 g/dL   RDW 14.1 11.5 - 15.5 %   Platelets 335 150 - 400 K/uL  Glucose, capillary     Status: None   Collection Time:  07/15/15 11:51 AM  Result Value Ref Range   Glucose-Capillary 87 65 - 99 mg/dL  Glucose, capillary     Status: Abnormal   Collection Time: 07/15/15  5:01 PM  Result Value Ref Range   Glucose-Capillary 103 (H) 65 - 99 mg/dL    Lipid Panel No results for input(s): CHOL, TRIG, HDL, CHOLHDL, VLDL, LDLCALC in the last 72 hours.  Studies/Results:   Medications:  Scheduled Meds: . amLODipine  10 mg Oral Daily  . amoxicillin-clavulanate  1 tablet Oral Q12H  . enoxaparin (LOVENOX) injection  40 mg Subcutaneous Q24H  . insulin aspart  0-5 Units Subcutaneous QHS  . insulin aspart  0-9 Units Subcutaneous TID WC  . potassium chloride  20 mEq Oral Daily  . sodium chloride  3 mL Intravenous Q12H   Continuous Infusions: . sodium chloride 0.45 % 1,000 mL with potassium chloride 40 mEq infusion 75 mL/hr at 07/14/15 2339   PRN Meds:.acetaminophen **OR** acetaminophen, haloperidol lactate, ondansetron **OR** ondansetron (ZOFRAN) IV     LOS: 2 days   Letecia Arps A. Merlene Laughter, M.D.  Diplomate, Tax adviser of Psychiatry and Neurology ( Neurology).

## 2015-07-16 DIAGNOSIS — I10 Essential (primary) hypertension: Secondary | ICD-10-CM

## 2015-07-16 DIAGNOSIS — F0390 Unspecified dementia without behavioral disturbance: Secondary | ICD-10-CM

## 2015-07-16 LAB — HIV ANTIBODY (ROUTINE TESTING W REFLEX): HIV SCREEN 4TH GENERATION: NONREACTIVE

## 2015-07-16 LAB — GLUCOSE, CAPILLARY
GLUCOSE-CAPILLARY: 95 mg/dL (ref 65–99)
Glucose-Capillary: 98 mg/dL (ref 65–99)

## 2015-07-16 LAB — RPR: RPR: NONREACTIVE

## 2015-07-16 MED ORDER — AMOXICILLIN-POT CLAVULANATE 875-125 MG PO TABS: 1.0000 | ORAL_TABLET | Freq: Two times a day (BID) | ORAL | Status: DC

## 2015-07-16 NOTE — Progress Notes (Signed)
At 0900 patient right arm found to be swollen. IV infiltrated. IV stopped and removed. Warm compresses applied. There was 40 meq of potassium in fluid and some blisters noted at site where tape was at. Dr. Roderic Palau notified. He will look at. No new IV inserted at this time. Patient possible discharge. Will await for physician to decided.

## 2015-07-16 NOTE — Discharge Summary (Signed)
Physician Discharge Summary  Taylor Andrews IWP:[id number] DOB: 09-17-1934 DOA: 07/13/2015  PCP: Glo Herring., MD  Admit date: 07/13/2015 Discharge date: 07/16/2015  Time spent: 40 minutes  Recommendations for Outpatient Follow-up:  1. Patient will be discharged back to ALF  Discharge Diagnoses:  Active Problems:   Aspiration pneumonia   Acute encephalopathy   Sepsis   Dementia   HTN (hypertension)   DM (diabetes mellitus), type 2   Encephalopathy   Hypokalemia   Encephalopathy acute   Discharge Condition: improved  Diet recommendation: low salt, low carb  Filed Weights   07/13/15 0859 07/13/15 1313  Weight: 59.421 kg (131 lb) 61.825 kg (136 lb 4.8 oz)    History of present illness:  This patient presented to the hospital with change in mental status and vomiting while sleeping. She was found to have pneumonia on work up in ED and was admitted for further treatment  Hospital Course:  1. Sepsis likely related to PNA. Pt received IV fluids per sepsis protocol. Lactic acid has trended down. She was on broad-spectrum antibiotics. Cultures show no growth to date. Urinalysis was unremarkable and she did not have any neck stiffness. She was deescalated  to Augmentin. .  2. Aspiration pneumonia. Found on chest x-ray. History of vomiting while sleeping prior to arrival. She does not appear to be significantly short of breath or coughing. Will deescalate abx to Augmentin.  3. Acute encephalopathy.Likely related to pneumonia. Remains afebrile. She does not appear to have any gross focal deficits. She was not started on any new medications. Initial and repeat CT head was unremarkable for acute changes. EEG did not show any epileptiform discharges. Other metabolic workup was unrevealing. Mental status has now improved back to baseline. Neurology followed patient.    Procedures:  EEG, unremarkable  Consultations:  NEurology  Discharge Exam: Filed Vitals:   07/16/15 0555  BP:  129/59  Pulse: 78  Temp: 98.5 F (36.9 C)  Resp: 18    General: NAD Cardiovascular: S1, S2 rRR Respiratory: CTA B  Discharge Instructions   Discharge Instructions    Diet - low sodium heart healthy    Complete by:  As directed      Increase activity slowly    Complete by:  As directed           Current Discharge Medication List    START taking these medications   Details  amoxicillin-clavulanate (AUGMENTIN) 875-125 MG per tablet Take 1 tablet by mouth every 12 (twelve) hours. For 3 more days Qty: 6 tablet, Refills: 0      CONTINUE these medications which have NOT CHANGED   Details  amLODipine (NORVASC) 10 MG tablet Take 10 mg by mouth daily.     dicyclomine (BENTYL) 10 MG capsule Take 10 mg by mouth 4 (four) times daily as needed for spasms.     docusate sodium (COLACE) 100 MG capsule Take 100 mg by mouth 3 (three) times daily.    donepezil (ARICEPT) 10 MG tablet Take 10 mg by mouth at bedtime.    hydrochlorothiazide 25 MG tablet Take 12.5 mg by mouth every morning.     metFORMIN (GLUCOPHAGE) 500 MG tablet Take 500 mg by mouth daily with breakfast.     NAMENDA 5 MG tablet Take 5 mg by mouth 2 (two) times daily.     niacin (NIASPAN) 500 MG CR tablet Take 500 mg by mouth daily.     Omega-3 Fatty Acids (FISH OIL) 1000 MG CAPS Take 1 capsule  by mouth daily.     potassium chloride (KLOR-CON) 20 MEQ packet Take 20 mEq by mouth daily.       Allergies  Allergen Reactions  . Iohexol      Desc: PT STATES THAT THE CONTRAST THAT SHE HAD IN 2004 MADE HER SICK, PT COULD NOT SPECIFY REACTION TYPES   . Milk-Related Compounds     Lactose Intolerant       The results of significant diagnostics from this hospitalization (including imaging, microbiology, ancillary and laboratory) are listed below for reference.    Significant Diagnostic Studies: Ct Head Wo Contrast  07/15/2015   CLINICAL DATA:  81 year old female with altered mental status for 2 days. Chronic right  subdural hematoma. Initial encounter.  EXAM: CT HEAD WITHOUT CONTRAST  TECHNIQUE: Contiguous axial images were obtained from the base of the skull through the vertex without intravenous contrast.  COMPARISON:  07/13/2015, 07/04/2014.  FINDINGS: Right vertex mostly isodense subdural collection re - identified and stable measuring up to 5 mm in thickness. No midline shift. No acute intracranial mass effect. No hyperdense intracranial hemorrhage identified. No ventriculomegaly. Basilar cisterns remain patent. No superimposed acute cortically based infarct. No suspicious intracranial vascular hyperdensity.  Sequelae of right frontal craniotomy. Stable visualized osseous structures. Visualized paranasal sinuses and mastoids are clear. Stable orbit and scalp soft tissues. Mild Calcified atherosclerosis at the skull base.  IMPRESSION: 1. Right superior convexity isodense subdural hematoma measuring up to 5 mm in thickness has not significantly changed since August 2015. 2. No new intracranial abnormality identified.   Electronically Signed   By: Genevie Ann M.D.   On: 07/15/2015 11:58   Ct Head Wo Contrast  07/13/2015   CLINICAL DATA:  Mental status change  EXAM: CT HEAD WITHOUT CONTRAST  TECHNIQUE: Contiguous axial images were obtained from the base of the skull through the vertex without intravenous contrast.  COMPARISON:  07/04/2014  FINDINGS: No skull fracture. Again noted postsurgical changes with prior right frontal craniotomy. No intracranial hemorrhage, mass effect or midline shift. Paranasal sinuses and mastoid air cells are unremarkable.  Mild cerebral atrophy is stable. No acute cortical infarction. Stable 4 mm right frontoparietal chronic subdural collection unchanged from prior exam. Best seen in axial image 20. Ventricular size is stable from prior exam. No mass lesion is noted on this unenhanced scan.  IMPRESSION: No acute intracranial abnormality. Stable cerebral atrophy. Stable 4 mm right frontoparietal  chronic subdural collection.   Electronically Signed   By: Lahoma Crocker M.D.   On: 07/13/2015 10:20   Dg Chest Port 1 View  07/13/2015   CLINICAL DATA:  Altered mental status.  Weakness.  EXAM: PORTABLE CHEST - 1 VIEW  COMPARISON:  January 23, 2014.  FINDINGS: The heart size and mediastinal contours are within normal limits. No pneumothorax is noted. Right lung is clear. Elevated left hemidiaphragm is noted. Mild left basilar opacity is noted which may represent subsegmental atelectasis or possibly early pneumonia. Possible small pleural effusion may be present on the left. The visualized skeletal structures are unremarkable.  IMPRESSION: Elevated left hemidiaphragm. Mild left basilar opacity concerning for subsegmental atelectasis or possibly pneumonia with possible small associated pleural effusion.   Electronically Signed   By: Marijo Conception, M.D.   On: 07/13/2015 09:17    Microbiology: Recent Results (from the past 240 hour(s))  Culture, blood (routine x 2)     Status: None (Preliminary result)   Collection Time: 07/13/15  9:40 AM  Result Value Ref Range Status  Specimen Description BLOOD RIGHT HAND  Final   Special Requests BOTTLES DRAWN AEROBIC ONLY 5CC  Final   Culture NO GROWTH 3 DAYS  Final   Report Status PENDING  Incomplete  Urine culture     Status: None   Collection Time: 07/13/15 10:24 AM  Result Value Ref Range Status   Specimen Description URINE, CATHETERIZED  Final   Special Requests NONE  Final   Culture   Final    NO GROWTH 1 DAY Performed at [facility]    Report Status 07/14/2015 FINAL  Final  Culture, blood (routine x 2)     Status: None (Preliminary result)   Collection Time: 07/13/15 10:40 AM  Result Value Ref Range Status   Specimen Description BLOOD RIGHT ARM  Final   Special Requests BOTTLES DRAWN AEROBIC ONLY 4CC  Final   Culture NO GROWTH 3 DAYS  Final   Report Status PENDING  Incomplete     Labs: Basic Metabolic Panel:  Recent Labs Lab  07/13/15 0941 07/14/15 0130 07/15/15 0830  NA 140 147* 141  K 3.5 2.7* 4.2  CL 104 113* 109  CO2 26 26 24   GLUCOSE 172* 142* 104*  BUN 13 8 9   CREATININE 0.84 0.64 0.88  CALCIUM 9.4 8.8* 9.7  MG  --  2.1 2.4   Liver Function Tests:  Recent Labs Lab 07/13/15 0941 07/14/15 0130  AST 24 29  ALT 17 18  ALKPHOS 72 65  BILITOT 0.7 0.6  PROT 7.3 6.7  ALBUMIN 3.9 3.6   No results for input(s): LIPASE, AMYLASE in the last 168 hours.  Recent Labs Lab 07/13/15 1800  AMMONIA 17   CBC:  Recent Labs Lab 07/13/15 0939 07/14/15 0130 07/15/15 0830  WBC 7.1 7.4 7.2  NEUTROABS 6.0  --   --   HGB 13.9 13.0 15.4*  HCT 42.1 39.4 47.3*  MCV 89.8 89.7 90.6  PLT 289 273 335   Cardiac Enzymes:  Recent Labs Lab 07/13/15 0941  TROPONINI <0.03   BNP: BNP (last 3 results) No results for input(s): BNP in the last 8760 hours.  ProBNP (last 3 results) No results for input(s): PROBNP in the last 8760 hours.  CBG:  Recent Labs Lab 07/15/15 0813 07/15/15 1151 07/15/15 1701 07/15/15 2141 07/16/15 0721  GLUCAP 85 87 103* 122* 98       Signed:  MEMON,JEHANZEB  [facility] 07/16/2015, 10:56 AM

## 2015-07-16 NOTE — Progress Notes (Signed)
Telemetry removed. IV out. Report called to [facility] at [facility] assisted [facility]. Patients daughter will be transporting patient.

## 2015-07-16 NOTE — Progress Notes (Signed)
Verified availability and acceptance of patient by Winifred Olive at [facility] assisted living. Message left for weekend social work for assistance finishing up discharge paperwork. IV out and daughter will transport.

## 2015-07-18 LAB — CULTURE, BLOOD (ROUTINE X 2)
CULTURE: NO GROWTH
Culture: NO GROWTH

## 2015-08-03 DIAGNOSIS — I10 Essential (primary) hypertension: Secondary | ICD-10-CM | POA: Diagnosis not present

## 2015-08-03 DIAGNOSIS — E782 Mixed hyperlipidemia: Secondary | ICD-10-CM | POA: Diagnosis not present

## 2015-08-03 DIAGNOSIS — E119 Type 2 diabetes mellitus without complications: Secondary | ICD-10-CM | POA: Diagnosis not present

## 2015-08-03 DIAGNOSIS — Z6829 Body mass index (BMI) 29.0-29.9, adult: Secondary | ICD-10-CM | POA: Diagnosis not present

## 2015-08-03 DIAGNOSIS — Z1389 Encounter for screening for other disorder: Secondary | ICD-10-CM | POA: Diagnosis not present

## 2015-08-03 DIAGNOSIS — Z23 Encounter for immunization: Secondary | ICD-10-CM | POA: Diagnosis not present

## 2015-08-03 DIAGNOSIS — B351 Tinea unguium: Secondary | ICD-10-CM | POA: Diagnosis not present

## 2015-09-22 DIAGNOSIS — B351 Tinea unguium: Secondary | ICD-10-CM | POA: Diagnosis not present

## 2015-09-22 DIAGNOSIS — Z6830 Body mass index (BMI) 30.0-30.9, adult: Secondary | ICD-10-CM | POA: Diagnosis not present

## 2015-09-22 DIAGNOSIS — E782 Mixed hyperlipidemia: Secondary | ICD-10-CM | POA: Diagnosis not present

## 2015-09-22 DIAGNOSIS — E1129 Type 2 diabetes mellitus with other diabetic kidney complication: Secondary | ICD-10-CM | POA: Diagnosis not present

## 2015-09-22 DIAGNOSIS — I10 Essential (primary) hypertension: Secondary | ICD-10-CM | POA: Diagnosis not present

## 2015-09-22 DIAGNOSIS — N182 Chronic kidney disease, stage 2 (mild): Secondary | ICD-10-CM | POA: Diagnosis not present

## 2015-09-22 DIAGNOSIS — Z1389 Encounter for screening for other disorder: Secondary | ICD-10-CM | POA: Diagnosis not present

## 2015-09-22 DIAGNOSIS — E6609 Other obesity due to excess calories: Secondary | ICD-10-CM | POA: Diagnosis not present

## 2015-10-17 DIAGNOSIS — B351 Tinea unguium: Secondary | ICD-10-CM | POA: Diagnosis not present

## 2015-10-17 DIAGNOSIS — L603 Nail dystrophy: Secondary | ICD-10-CM | POA: Diagnosis not present

## 2015-10-17 DIAGNOSIS — M792 Neuralgia and neuritis, unspecified: Secondary | ICD-10-CM | POA: Diagnosis not present

## 2015-10-17 DIAGNOSIS — I739 Peripheral vascular disease, unspecified: Secondary | ICD-10-CM | POA: Diagnosis not present

## 2015-10-26 DIAGNOSIS — M792 Neuralgia and neuritis, unspecified: Secondary | ICD-10-CM | POA: Diagnosis not present

## 2015-10-26 DIAGNOSIS — G609 Hereditary and idiopathic neuropathy, unspecified: Secondary | ICD-10-CM | POA: Diagnosis not present

## 2015-11-04 DIAGNOSIS — M7989 Other specified soft tissue disorders: Secondary | ICD-10-CM | POA: Diagnosis not present

## 2015-11-21 DIAGNOSIS — M79674 Pain in right toe(s): Secondary | ICD-10-CM | POA: Diagnosis not present

## 2015-11-21 DIAGNOSIS — M79675 Pain in left toe(s): Secondary | ICD-10-CM | POA: Diagnosis not present

## 2015-11-21 DIAGNOSIS — M792 Neuralgia and neuritis, unspecified: Secondary | ICD-10-CM | POA: Diagnosis not present

## 2015-11-21 DIAGNOSIS — B351 Tinea unguium: Secondary | ICD-10-CM | POA: Diagnosis not present

## 2015-12-19 DIAGNOSIS — B351 Tinea unguium: Secondary | ICD-10-CM | POA: Diagnosis not present

## 2015-12-19 DIAGNOSIS — M792 Neuralgia and neuritis, unspecified: Secondary | ICD-10-CM | POA: Diagnosis not present

## 2015-12-22 ENCOUNTER — Encounter: Payer: Self-pay | Admitting: Internal Medicine

## 2015-12-26 DIAGNOSIS — I739 Peripheral vascular disease, unspecified: Secondary | ICD-10-CM | POA: Diagnosis not present

## 2015-12-26 DIAGNOSIS — L603 Nail dystrophy: Secondary | ICD-10-CM | POA: Diagnosis not present

## 2015-12-26 DIAGNOSIS — E1151 Type 2 diabetes mellitus with diabetic peripheral angiopathy without gangrene: Secondary | ICD-10-CM | POA: Diagnosis not present

## 2016-01-16 DIAGNOSIS — B351 Tinea unguium: Secondary | ICD-10-CM | POA: Diagnosis not present

## 2016-01-16 DIAGNOSIS — M792 Neuralgia and neuritis, unspecified: Secondary | ICD-10-CM | POA: Diagnosis not present

## 2016-02-10 DIAGNOSIS — I10 Essential (primary) hypertension: Secondary | ICD-10-CM | POA: Diagnosis not present

## 2016-02-10 DIAGNOSIS — R201 Hypoesthesia of skin: Secondary | ICD-10-CM | POA: Diagnosis not present

## 2016-02-10 DIAGNOSIS — E1140 Type 2 diabetes mellitus with diabetic neuropathy, unspecified: Secondary | ICD-10-CM | POA: Diagnosis not present

## 2016-02-10 DIAGNOSIS — Z1389 Encounter for screening for other disorder: Secondary | ICD-10-CM | POA: Diagnosis not present

## 2016-04-05 DIAGNOSIS — I739 Peripheral vascular disease, unspecified: Secondary | ICD-10-CM | POA: Diagnosis not present

## 2016-04-05 DIAGNOSIS — E1151 Type 2 diabetes mellitus with diabetic peripheral angiopathy without gangrene: Secondary | ICD-10-CM | POA: Diagnosis not present

## 2016-04-05 DIAGNOSIS — L603 Nail dystrophy: Secondary | ICD-10-CM | POA: Diagnosis not present

## 2016-04-05 DIAGNOSIS — L84 Corns and callosities: Secondary | ICD-10-CM | POA: Diagnosis not present

## 2016-04-16 DIAGNOSIS — M7750 Other enthesopathy of unspecified foot: Secondary | ICD-10-CM | POA: Diagnosis not present

## 2016-07-02 DIAGNOSIS — E1151 Type 2 diabetes mellitus with diabetic peripheral angiopathy without gangrene: Secondary | ICD-10-CM | POA: Diagnosis not present

## 2016-07-02 DIAGNOSIS — L603 Nail dystrophy: Secondary | ICD-10-CM | POA: Diagnosis not present

## 2016-07-02 DIAGNOSIS — I739 Peripheral vascular disease, unspecified: Secondary | ICD-10-CM | POA: Diagnosis not present

## 2016-08-02 ENCOUNTER — Emergency Department (HOSPITAL_COMMUNITY): Payer: Medicare Other

## 2016-08-02 ENCOUNTER — Encounter (HOSPITAL_COMMUNITY): Payer: Self-pay

## 2016-08-02 ENCOUNTER — Emergency Department (HOSPITAL_COMMUNITY)
Admission: EM | Admit: 2016-08-02 | Discharge: 2016-08-02 | Disposition: A | Discharge status: Home / Self Care | Payer: Medicare Other | Attending: Emergency Medicine | Admitting: Emergency Medicine

## 2016-08-02 DIAGNOSIS — E119 Type 2 diabetes mellitus without complications: Secondary | ICD-10-CM | POA: Diagnosis not present

## 2016-08-02 DIAGNOSIS — I10 Essential (primary) hypertension: Secondary | ICD-10-CM | POA: Insufficient documentation

## 2016-08-02 DIAGNOSIS — R112 Nausea with vomiting, unspecified: Secondary | ICD-10-CM | POA: Insufficient documentation

## 2016-08-02 DIAGNOSIS — R61 Generalized hyperhidrosis: Secondary | ICD-10-CM | POA: Diagnosis not present

## 2016-08-02 DIAGNOSIS — R1030 Lower abdominal pain, unspecified: Secondary | ICD-10-CM | POA: Insufficient documentation

## 2016-08-02 DIAGNOSIS — K2970 Gastritis, unspecified, without bleeding: Secondary | ICD-10-CM | POA: Diagnosis not present

## 2016-08-02 DIAGNOSIS — R1110 Vomiting, unspecified: Secondary | ICD-10-CM | POA: Diagnosis not present

## 2016-08-02 DIAGNOSIS — Z79899 Other long term (current) drug therapy: Secondary | ICD-10-CM | POA: Diagnosis not present

## 2016-08-02 DIAGNOSIS — R109 Unspecified abdominal pain: Secondary | ICD-10-CM

## 2016-08-02 DIAGNOSIS — R05 Cough: Secondary | ICD-10-CM | POA: Diagnosis not present

## 2016-08-02 DIAGNOSIS — R1111 Vomiting without nausea: Secondary | ICD-10-CM | POA: Diagnosis not present

## 2016-08-02 DIAGNOSIS — R110 Nausea: Secondary | ICD-10-CM | POA: Diagnosis not present

## 2016-08-02 LAB — COMPREHENSIVE METABOLIC PANEL
ALK PHOS: 78 U/L (ref 38–126)
ALT: 17 U/L (ref 14–54)
ANION GAP: 9 (ref 5–15)
AST: 21 U/L (ref 15–41)
Albumin: 4 g/dL (ref 3.5–5.0)
BUN: 10 mg/dL (ref 6–20)
CALCIUM: 9.3 mg/dL (ref 8.9–10.3)
CO2: 26 mmol/L (ref 22–32)
Chloride: 107 mmol/L (ref 101–111)
Creatinine, Ser: 0.78 mg/dL (ref 0.44–1.00)
GFR calc Af Amer: >60 mL/min (ref >60)
GFR calc non Af Amer: >60 mL/min (ref >60)
GLUCOSE: 119 mg/dL — AB (ref 65–99)
Potassium: 3 mmol/L — ABNORMAL LOW (ref 3.5–5.1)
SODIUM: 142 mmol/L (ref 135–145)
TOTAL PROTEIN: 7.3 g/dL (ref 6.5–8.1)
Total Bilirubin: 0.6 mg/dL (ref 0.3–1.2)

## 2016-08-02 LAB — URINALYSIS, ROUTINE W REFLEX MICROSCOPIC
Bilirubin Urine: NEGATIVE
GLUCOSE, UA: NEGATIVE mg/dL
HGB URINE DIPSTICK: NEGATIVE
Ketones, ur: NEGATIVE mg/dL
Nitrite: NEGATIVE
Protein, ur: NEGATIVE mg/dL
SPECIFIC GRAVITY, URINE: 1.01 (ref 1.005–1.030)
pH: 8 (ref 5.0–8.0)

## 2016-08-02 LAB — CBC WITH DIFFERENTIAL/PLATELET
Basophils Absolute: 0 10*3/uL (ref 0.0–0.1)
Basophils Relative: 1 %
Eosinophils Absolute: 0 10*3/uL (ref 0.0–0.7)
Eosinophils Relative: 0 %
HEMATOCRIT: 45.5 % (ref 36.0–46.0)
HEMOGLOBIN: 15.1 g/dL — AB (ref 12.0–15.0)
LYMPHS ABS: 1.6 10*3/uL (ref 0.7–4.0)
LYMPHS PCT: 26 %
MCH: 29.4 pg (ref 26.0–34.0)
MCHC: 33.2 g/dL (ref 30.0–36.0)
MCV: 88.5 fL (ref 78.0–100.0)
Monocytes Absolute: 0.4 10*3/uL (ref 0.1–1.0)
Monocytes Relative: 6 %
NEUTROS ABS: 4.2 10*3/uL (ref 1.7–7.7)
NEUTROS PCT: 67 %
Platelets: 288 10*3/uL (ref 150–400)
RBC: 5.14 MIL/uL — AB (ref 3.87–5.11)
RDW: 13.5 % (ref 11.5–15.5)
WBC: 6.2 10*3/uL (ref 4.0–10.5)

## 2016-08-02 LAB — URINE MICROSCOPIC-ADD ON

## 2016-08-02 LAB — LIPASE, BLOOD: Lipase: 34 U/L (ref 11–51)

## 2016-08-02 MED ORDER — ONDANSETRON HCL 4 MG/2ML IJ SOLN
4.0000 mg | Freq: Once | INTRAMUSCULAR | Status: AC
  Administered 2016-08-02: 4 mg via INTRAVENOUS
  Filled 2016-08-02: qty 2

## 2016-08-02 MED ORDER — SODIUM CHLORIDE 0.9 % IV BOLUS (SEPSIS)
1000.0000 mL | Freq: Once | INTRAVENOUS | Status: AC
  Administered 2016-08-02: 1000 mL via INTRAVENOUS

## 2016-08-02 MED ORDER — ONDANSETRON HCL 4 MG PO TABS: 4.0000 mg | ORAL_TABLET | Freq: Three times a day (TID) | ORAL | 0 refills | Status: DC | PRN

## 2016-08-02 MED ORDER — POTASSIUM CHLORIDE CRYS ER 20 MEQ PO TBCR: 20.0000 meq | EXTENDED_RELEASE_TABLET | Freq: Two times a day (BID) | ORAL | 0 refills | Status: DC

## 2016-08-02 NOTE — ED Provider Notes (Signed)
[facility] DEPT Provider Note   CSN: [id number] Arrival date & time: 08/02/16  0906  By signing my name below, I, Sonum Patel, attest that this documentation has been prepared under the direction and in the presence of Merrily Pew, MD. Electronically Signed: Sonum Patel, Education administrator. 08/02/16. 10:03 AM.  History   Chief Complaint Chief Complaint  Patient presents with  . Abdominal Pain    The history is provided by the patient and a relative. No language interpreter was used.     HPI Comments: Taylor Andrews is a 82 y.o. female brought in by ambulance, who presents to the Emergency Department from an assisted living facility complaining of constant lower abdominal pain that began yesterday. Niece reports patient had nausea, vomiting and became diaphoretic this morning with caregiver. Niece denies sick contacts at facility. She states last year she had similar symptoms which resulted in aspiration PNA and acute encephalopathy. She denies dysuria, frequency, urine color change, fever, cough, SOB, rash, CP, diarrhea, dizziness, lightheadedness. She denies cardiac history.   Past Medical History:  Diagnosis Date  . Adenomatous colon polyp    carcinoma in situ  . Chronic abdominal pain   . Dementia   . Diabetes mellitus   . Fatty liver    on [place] normal LFT'S  . GERD (gastroesophageal reflux disease)   . Hyperlipidemia   . Hypertension   . Iron deficiency    Chronic current parameter normal; 4/09 iron was 12 sat 5%  . SDH (subdural hematoma) ([place]) 01/2014   chronic    Patient Active Problem List   Diagnosis Date Noted  . Hypokalemia 07/14/2015  . Encephalopathy acute   . Aspiration pneumonia ([place]) 07/13/2015  . Acute encephalopathy 07/13/2015  . Sepsis ([place]) 07/13/2015  . Dementia 07/13/2015  . HTN (hypertension) 07/13/2015  . DM (diabetes mellitus), type 2 ([place]) 07/13/2015  . Encephalopathy 07/13/2015  . Subdural hematoma ([place]) 01/22/2014  . Lower abdominal pain 12/30/2013    . Hx of adenomatous colonic polyps 12/30/2013  . Anemia 04/03/2013  . IRON DEFICIENCY 06/02/2009  . MILK PRODUCTS ALLERGY 06/02/2009  . COLONIC POLYPS 05/26/2009  . HEMORRHOIDS, INTERNAL 05/26/2009  . ABDOMINAL PAIN 05/26/2009    Past Surgical History:  Procedure Laterality Date  . ABDOMINAL HYSTERECTOMY    . ANKLE SURGERY     Left ;pins  . COLONOSCOPY  8/05   internal hemorrhoids,polyp bengin  . COLONOSCOPY  01/12/2011   [id number] rectum/ 5-mm polyp at the splenic flexure s/p polypectomy, tattooing, and scarred hepatic flexure, absolutely no evidence of any residual polyp tissue or neoplasm/Scattered left-sided diverticula. Tubular adenoma, no high-grade dysplasia.Repeat TCS due 01/2016  . COLONOSCOPY  06/16/2009   [id number] hemorrhoids, otherwise normal rectum.Flat polypoid lesion at the hepatic flexure, debulked with piecemeal snare polypectomy.  This area was also tattooed Transverse colon and descending colon  flat adenomatous appearing polyps resected with hot snare cautery as well. Tubulovillous adenomas  . COLONOSCOPY  December 2010   Dr. Gala Romney: Flat sessile hepatic flexure polyp status post saline-assisted piecemeal polypectomy. Fragments of adenomatous polyp with high-grade dysplasia.  . COLONOSCOPY  March 2011   Dr. Gala Romney: Minimal persisting polypoid tissue at site of previous piecemeal polypectomy status post hot snare polypectomy. Tubular adenoma, no high-grade dysplasia  . CRANIOTOMY Right 01/23/2014   Procedure: CRANIOTOMY HEMATOMA EVACUATION SUBDURAL;  Surgeon: Eustace Moore, MD;  Location: [facility];  Service: Neurosurgery;  Laterality: Right;  . ESOPHAGOGASTRODUODENOSCOPY  6/05   tiny antral erosin with some deformity of  pyloric channel  . VESICOVAGINAL FISTULA CLOSURE W/ TAH      OB History    No data available       Home Medications    Prior to Admission medications   Medication Sig Start Date End Date Taking? Authorizing Provider  amLODipine  (NORVASC) 10 MG tablet Take 10 mg by mouth daily.    Yes Historical Provider, MD  dicyclomine (BENTYL) 10 MG capsule Take 10 mg by mouth 4 (four) times daily as needed for spasms.    Yes Historical Provider, MD  docusate sodium (COLACE) 100 MG capsule Take 100 mg by mouth 3 (three) times daily.   Yes Historical Provider, MD  donepezil (ARICEPT) 10 MG tablet Take 10 mg by mouth daily.    Yes Historical Provider, MD  hydrochlorothiazide 25 MG tablet Take 25 mg by mouth daily.    Yes Historical Provider, MD  NAMENDA 5 MG tablet Take 5 mg by mouth 2 (two) times daily.  12/09/13  Yes Historical Provider, MD  niacin (NIASPAN) 500 MG CR tablet Take 500 mg by mouth daily.  12/23/13  Yes Historical Provider, MD  Omega-3 Fatty Acids (FISH OIL) 1000 MG CAPS Take 1 capsule by mouth 2 (two) times daily with a meal.    Yes Historical Provider, MD  potassium chloride (KLOR-CON) 20 MEQ packet Take 20 mEq by mouth daily.   Yes Historical Provider, MD  ondansetron (ZOFRAN) 4 MG tablet Take 1 tablet (4 mg total) by mouth every 8 (eight) hours as needed for nausea or vomiting. 08/02/16   Merrily Pew, MD  potassium chloride SA (K-DUR,KLOR-CON) 20 MEQ tablet Take 1 tablet (20 mEq total) by mouth 2 (two) times daily. 08/02/16 08/09/16  Merrily Pew, MD    Family History Family History  Problem Relation Age of Onset  . Lupus Sister   . Other Sister     ? Liver cancer    Social History Social History  Substance Use Topics  . Smoking status: Never Smoker  . Smokeless tobacco: Never Used  . Alcohol use No     Allergies   Iohexol and Milk-related compounds   Review of Systems Review of Systems  Constitutional: Negative for fever.  Respiratory: Negative for cough and shortness of breath.   Cardiovascular: Negative for chest pain.  Gastrointestinal: Positive for abdominal pain, nausea and vomiting. Negative for diarrhea.  Genitourinary: Negative for dysuria and frequency.  Skin: Negative for rash.    Neurological: Negative for dizziness and light-headedness.  All other systems reviewed and are negative.    Physical Exam Updated Vital Signs BP (!) 125/53 (BP Location: Left Arm)   Pulse 68   Temp 98.2 F (36.8 C) (Oral)   Resp 14   Ht 5' (1.524 m)   Wt 110 lb (49.9 kg)   SpO2 96%   BMI 21.48 kg/m   Physical Exam  Constitutional: She is oriented to person, place, and time. She appears well-developed and well-nourished.  HENT:  Head: Normocephalic and atraumatic.  Cardiovascular: Normal rate, regular rhythm and normal heart sounds.  Exam reveals no gallop and no friction rub.   No murmur heard. Pulmonary/Chest: Effort normal and breath sounds normal. No respiratory distress. She has no wheezes. She has no rales.  Abdominal: Soft. Bowel sounds are normal. She exhibits no distension. There is no tenderness. There is no rebound and no guarding.  Musculoskeletal: She exhibits no edema.  Neurological: She is alert and oriented to person, place, and time.  Skin: Skin is  warm and dry.  Psychiatric: She has a normal mood and affect.  Nursing note and vitals reviewed.    ED Treatments / Results  DIAGNOSTIC STUDIES: Oxygen Saturation is 100% on RA, normal by my interpretation.    COORDINATION OF CARE: 10:07 AM Discussed treatment plan with pt and family at bedside and they agreed to plan.    Labs (all labs ordered are listed, but only abnormal results are displayed) Labs Reviewed  CBC WITH DIFFERENTIAL/PLATELET - Abnormal; Notable for the following:       Result Value   RBC 5.14 (*)    Hemoglobin 15.1 (*)    All other components within normal limits  URINALYSIS, ROUTINE W REFLEX MICROSCOPIC (NOT AT [facility]) - Abnormal; Notable for the following:    Leukocytes, UA TRACE (*)    All other components within normal limits  COMPREHENSIVE METABOLIC PANEL - Abnormal; Notable for the following:    Potassium 3.0 (*)    Glucose, Bld 119 (*)    All other components within normal  limits  URINE MICROSCOPIC-ADD ON - Abnormal; Notable for the following:    Squamous Epithelial / LPF 0-5 (*)    Bacteria, UA RARE (*)    All other components within normal limits  LIPASE, BLOOD    EKG  EKG Interpretation  Date/Time:  Thursday August 02 2016 10:22:53 EDT Ventricular Rate:  62 PR Interval:    QRS Duration: 119 QT Interval:  460 QTC Calculation: 468 R Axis:   59 Text Interpretation:  Sinus rhythm Nonspecific intraventricular conduction delay Confirmed by [facility] MD, Corene Cornea [phone]) on 08/02/2016 11:12:38 AM       Radiology Dg Chest 2 View  Result Date: 08/02/2016 CLINICAL DATA:  Coughing. EXAM: CHEST  2 VIEW COMPARISON:  07/13/2015 FINDINGS: Lungs are essentially clear. Few densities at the right lung base could represent atelectasis or scarring. Heart and mediastinum are within normal limits. The trachea is midline. Multilevel degenerative endplate changes in the thoracic spine. No large pleural effusions. IMPRESSION: No active cardiopulmonary disease. Electronically Signed   By: Markus Daft M.D.   On: 08/02/2016 13:49   Ct Renal Stone Study  Result Date: 08/02/2016 CLINICAL DATA:  82 year old female with a history of nausea and vomiting. EXAM: CT ABDOMEN AND PELVIS WITHOUT CONTRAST TECHNIQUE: Multidetector CT imaging of the abdomen and pelvis was performed following the standard protocol without IV contrast. COMPARISON:  CT abdomen 07/04/2014, 02/03/2013 FINDINGS: Lower chest: No acute abnormality. Calcifications of the coronary arteries. Minimal calcifications of the aortic valve. Small hiatal hernia. Hepatobiliary: No focal liver abnormality is seen. No gallstones, gallbladder wall thickening, or biliary dilatation. Pancreas: Unremarkable Spleen: Unremarkable Adrenals/Urinary Tract: Adrenal glands are unremarkable. Kidneys are normal, without renal calculi, focal lesion, or hydronephrosis. Bladder is unremarkable. Stomach/Bowel: Small hiatal hernia. Unremarkable  appearance of the stomach. Unremarkable appearance of small bowel. Normal appendix. Mild changes of diverticular disease without evidence of acute diverticulitis. Moderate stool burden. Vascular/Lymphatic: Scattered calcifications of the abdominal aorta. No aneurysm. No periaortic fluid. No free fluid.  No inflammatory changes. Reproductive: Hysterectomy. Other:  None Musculoskeletal: No displaced fracture. Degenerative changes of the spine most pronounced at the L4-L5 and L5-S1 level. No bony canal narrowing. Degenerative changes of the hips. IMPRESSION: No acute finding identified to account for the patient's symptoms. Aortic atherosclerosis. Associated changes of coronary artery disease, incompletely imaged. Moderate stool burden. Signed, Dulcy Fanny. Earleen Newport, DO [facility] Electronically Signed   By: Corrie Mckusick D.O.  On: 08/02/2016 14:08    Procedures Procedures (including critical care time)  Medications Ordered in ED Medications  sodium chloride 0.9 % bolus 1,000 mL (0 mLs Intravenous Stopped 08/02/16 1150)  ondansetron (ZOFRAN) injection 4 mg (4 mg Intravenous Given 08/02/16 1021)     Initial Impression / Assessment and Plan / ED Course  I have reviewed the triage vital signs and the nursing notes.  Pertinent labs & imaging results that were available during my care of the patient were reviewed by me and considered in my medical decision making (see chart for details).  Clinical Course    Abdominal pain of uncertain etiology. Labs and imagine unremarkable. Repeat abdominal exam unremarkable but wasn't peritonitic or very concerning in the first place. Plan for close PCP follow up and return for new/worsening symptoms.   Final Clinical Impressions(s) / ED Diagnoses   Final diagnoses:  Abdominal pain, unspecified abdominal location  Non-intractable vomiting with nausea, vomiting of unspecified type    New  Prescriptions Discharge Medication List as of 08/02/2016  2:43 PM    START taking these medications   Details  ondansetron (ZOFRAN) 4 MG tablet Take 1 tablet (4 mg total) by mouth every 8 (eight) hours as needed for nausea or vomiting., Starting Thu 08/02/2016, Print    potassium chloride SA (K-DUR,KLOR-CON) 20 MEQ tablet Take 1 tablet (20 mEq total) by mouth 2 (two) times daily., Starting Thu 08/02/2016, Until Thu 08/09/2016, Print       I personally performed the services described in this documentation, which was scribed in my presence. The recorded information has been reviewed and is accurate.    Merrily Pew, MD 08/02/16 1622

## 2016-08-02 NOTE — ED Triage Notes (Signed)
Complain of nausea and vomiting that started this morning. Pt ws given Zofran 4 mg by EMS, prior to arrival

## 2016-08-02 NOTE — ED Notes (Signed)
Pt ambulatory to bathroom with assist to collect UA.  Pt states she feels better.

## 2016-08-09 DIAGNOSIS — E876 Hypokalemia: Secondary | ICD-10-CM | POA: Diagnosis not present

## 2016-08-14 DIAGNOSIS — I10 Essential (primary) hypertension: Secondary | ICD-10-CM | POA: Diagnosis not present

## 2016-08-14 DIAGNOSIS — K219 Gastro-esophageal reflux disease without esophagitis: Secondary | ICD-10-CM | POA: Diagnosis not present

## 2016-08-14 DIAGNOSIS — E1129 Type 2 diabetes mellitus with other diabetic kidney complication: Secondary | ICD-10-CM | POA: Diagnosis not present

## 2016-08-14 DIAGNOSIS — Z1389 Encounter for screening for other disorder: Secondary | ICD-10-CM | POA: Diagnosis not present

## 2016-08-14 DIAGNOSIS — E782 Mixed hyperlipidemia: Secondary | ICD-10-CM | POA: Diagnosis not present

## 2016-08-20 ENCOUNTER — Other Ambulatory Visit (HOSPITAL_COMMUNITY): Payer: Self-pay | Admitting: Internal Medicine

## 2016-08-20 DIAGNOSIS — Z1231 Encounter for screening mammogram for malignant neoplasm of breast: Secondary | ICD-10-CM

## 2016-08-21 ENCOUNTER — Other Ambulatory Visit (HOSPITAL_COMMUNITY): Payer: Self-pay | Admitting: Internal Medicine

## 2016-08-21 DIAGNOSIS — E2839 Other primary ovarian failure: Secondary | ICD-10-CM

## 2016-09-18 ENCOUNTER — Other Ambulatory Visit (HOSPITAL_COMMUNITY): Payer: Self-pay | Admitting: Internal Medicine

## 2016-09-18 DIAGNOSIS — E2839 Other primary ovarian failure: Secondary | ICD-10-CM

## 2016-09-20 ENCOUNTER — Ambulatory Visit (HOSPITAL_COMMUNITY)
Admission: RE | Admit: 2016-09-20 | Discharge: 2016-09-20 | Disposition: A | Discharge status: Home / Self Care | Payer: Medicare Other | Source: Ambulatory Visit | Attending: Internal Medicine | Admitting: Internal Medicine

## 2016-09-20 DIAGNOSIS — E2839 Other primary ovarian failure: Secondary | ICD-10-CM | POA: Diagnosis not present

## 2016-09-20 DIAGNOSIS — Z23 Encounter for immunization: Secondary | ICD-10-CM | POA: Diagnosis not present

## 2016-09-20 DIAGNOSIS — Z1231 Encounter for screening mammogram for malignant neoplasm of breast: Secondary | ICD-10-CM

## 2016-11-12 IMAGING — CT CT HEAD W/O CM
1 of 2 series · 14 of 30 positions shown, 18 images · non-contrast
Comparison: 07/13/2015, 07/04/2014.

CLINICAL DATA: 80-year-old female with altered mental status for 2
days. Chronic right subdural hematoma. Initial encounter.

EXAM:
CT HEAD WITHOUT CONTRAST
TECHNIQUE: Contiguous axial images were obtained from the base of the skull
through the vertex without intravenous contrast.

[Series 3: headseq 4.8 h60s · axial · 0.43mm/px · z∈[+145,+282]mm · 14 of 35 slices shown, 18 images]
[im 3/35  brain]
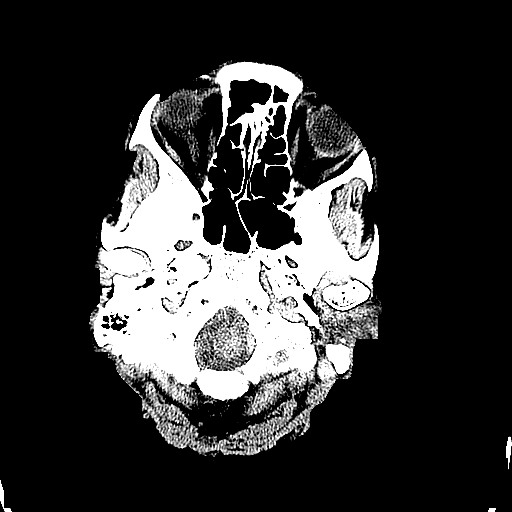
[im 3/35  bone]
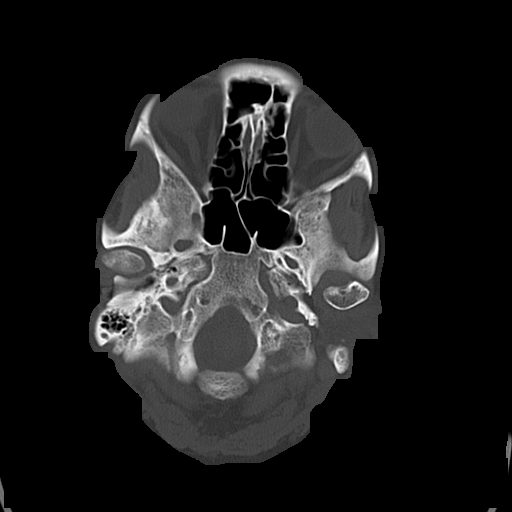
[im 5/35  brain]
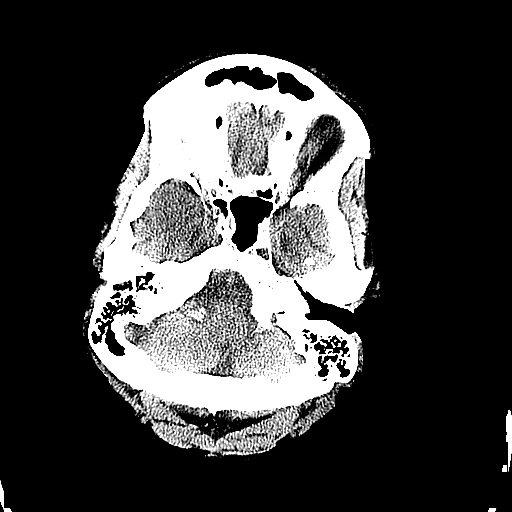
[im 7/35  brain]
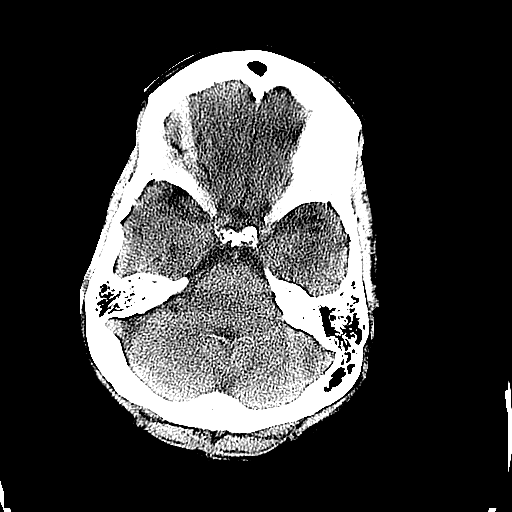
[im 10/35  brain]
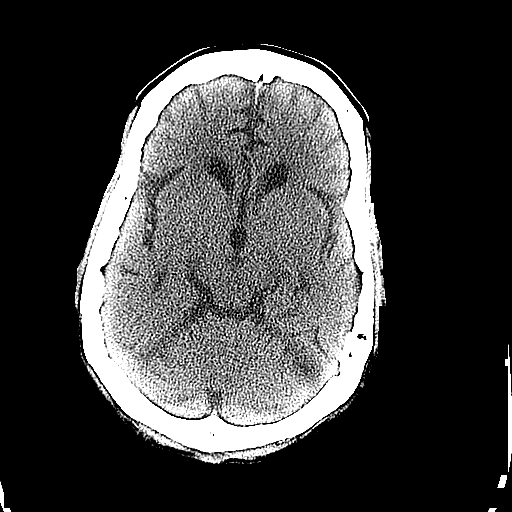
[im 12/35  brain]
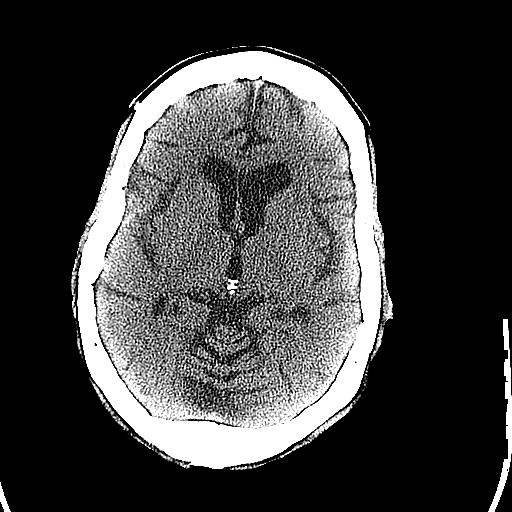
[im 12/35  bone]
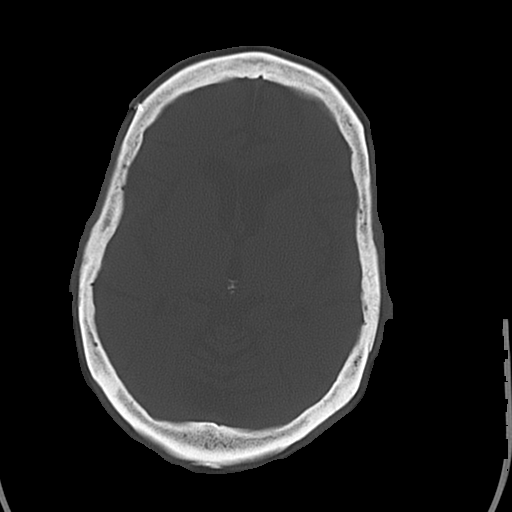
[im 14/35  brain]
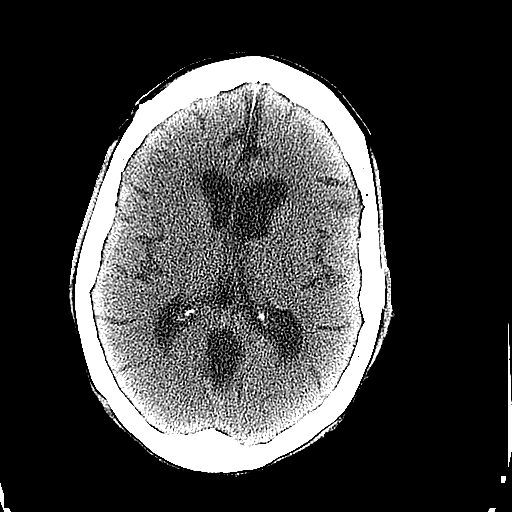
[im 16/35  brain]
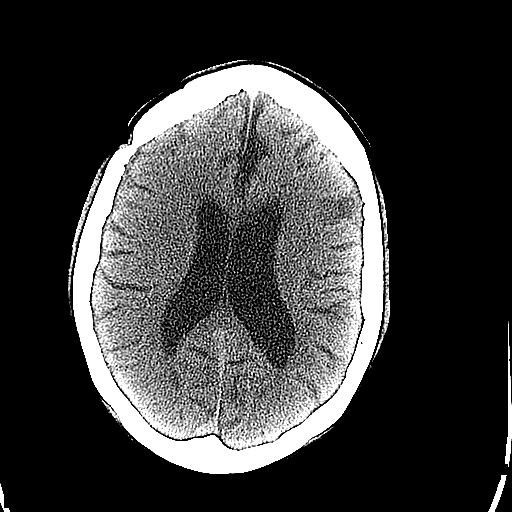
[im 19/35  brain]
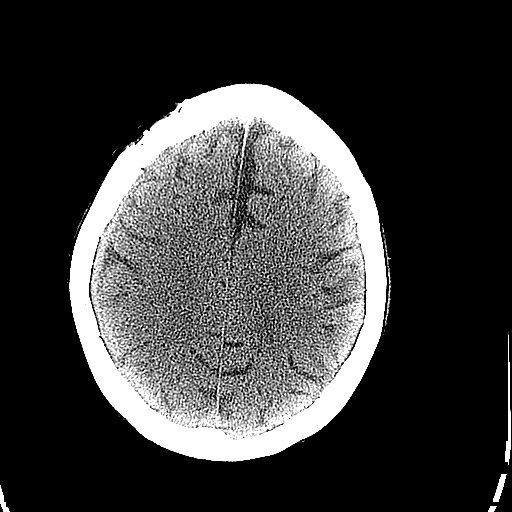
[im 21/35  brain]
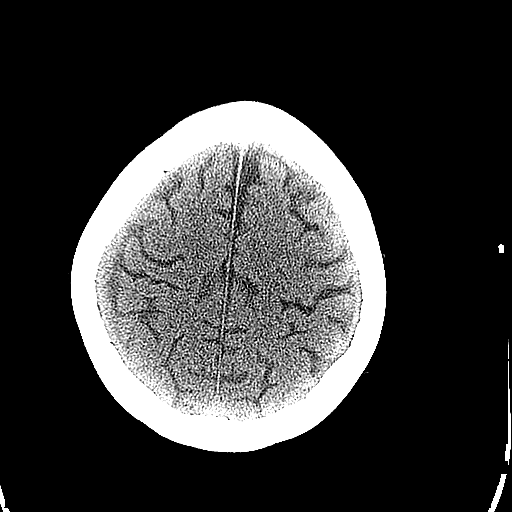
[im 21/35  bone]
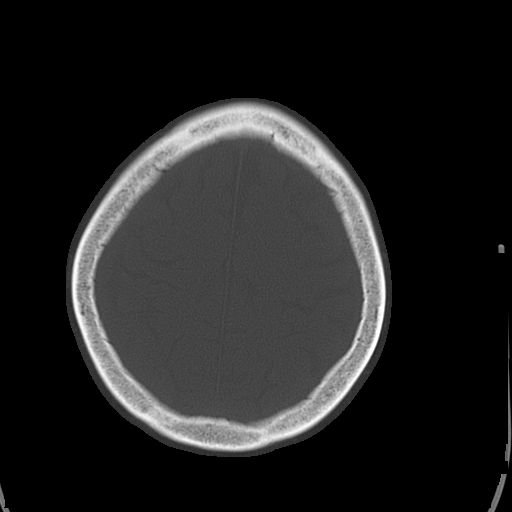
[im 23/35  brain]
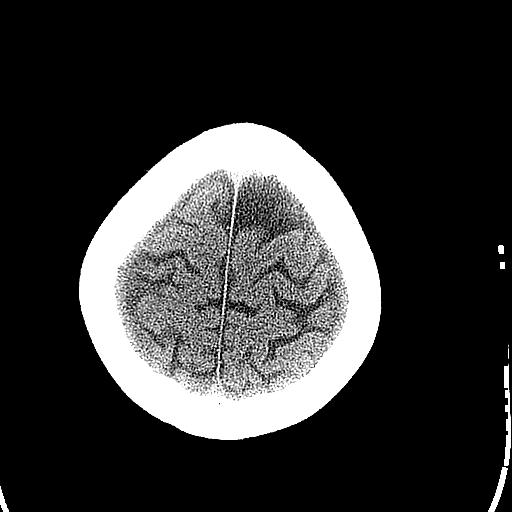
[im 25/35  brain]
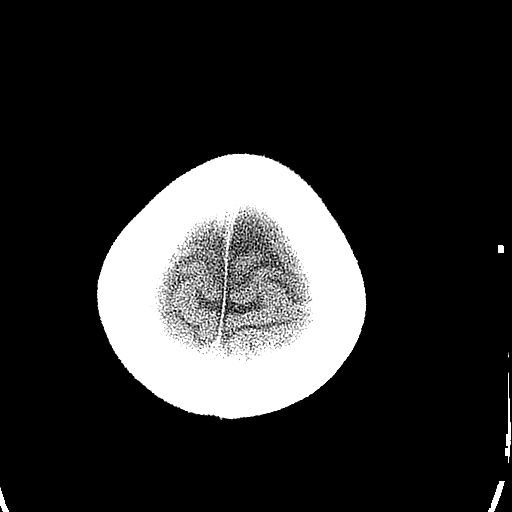
[im 28/35  brain]
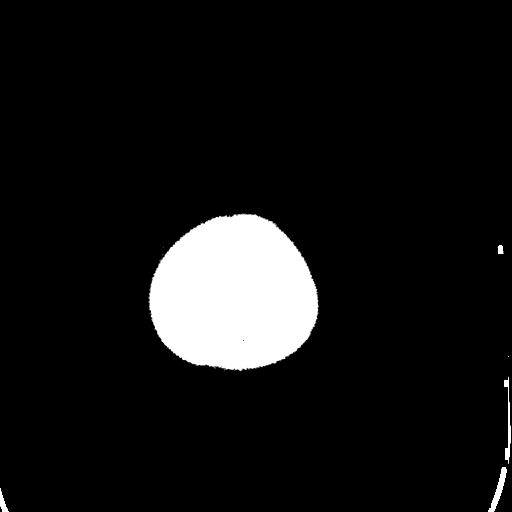
[im 30/35  brain]
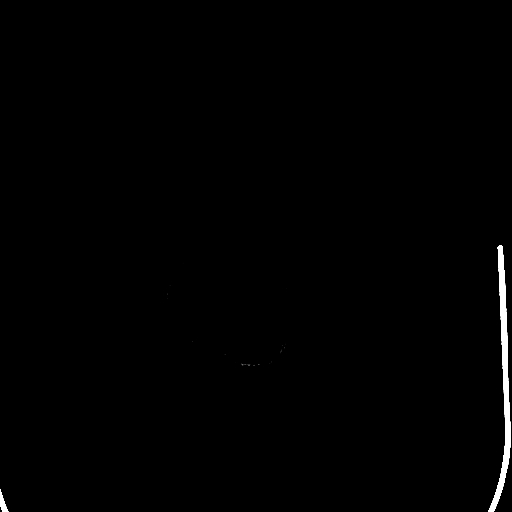
[im 30/35  bone]
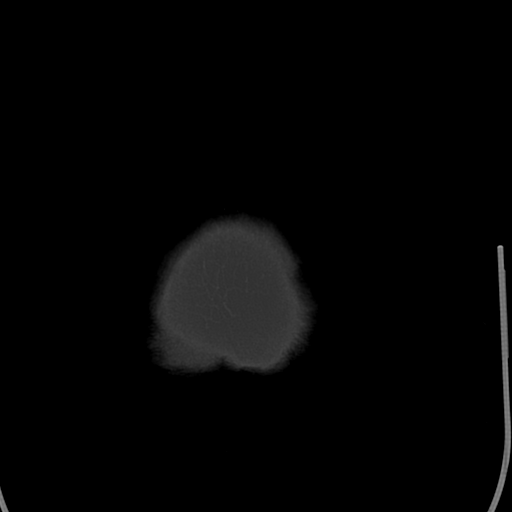
[im 32/35  brain]
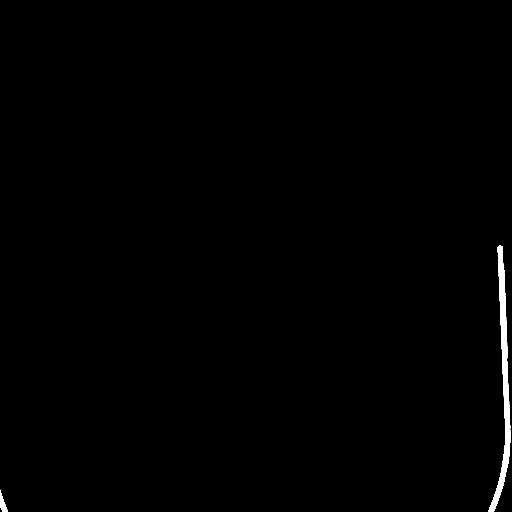

[14 of 30 positions shown; findings below may reference images not displayed]

FINDINGS: Right vertex mostly isodense subdural collection re - identified and
stable measuring up to 5 mm in thickness. No midline shift. No acute
intracranial mass effect. No hyperdense intracranial hemorrhage
identified. No ventriculomegaly. Basilar cisterns remain patent. No
superimposed acute cortically based infarct. No suspicious
intracranial vascular hyperdensity.

Sequelae of right frontal craniotomy. Stable visualized osseous
structures. Visualized paranasal sinuses and mastoids are clear.
Stable orbit and scalp soft tissues. Mild Calcified atherosclerosis
at the skull base.
IMPRESSION: 1. Right superior convexity isodense subdural hematoma measuring up
to 5 mm in thickness has not significantly changed since [DATE]. No new intracranial abnormality identified.

## 2017-01-01 DIAGNOSIS — K219 Gastro-esophageal reflux disease without esophagitis: Secondary | ICD-10-CM | POA: Diagnosis not present

## 2017-01-01 DIAGNOSIS — E1129 Type 2 diabetes mellitus with other diabetic kidney complication: Secondary | ICD-10-CM | POA: Diagnosis not present

## 2017-01-01 DIAGNOSIS — I10 Essential (primary) hypertension: Secondary | ICD-10-CM | POA: Diagnosis not present

## 2017-01-01 DIAGNOSIS — R39198 Other difficulties with micturition: Secondary | ICD-10-CM | POA: Diagnosis not present

## 2017-01-21 DIAGNOSIS — E1151 Type 2 diabetes mellitus with diabetic peripheral angiopathy without gangrene: Secondary | ICD-10-CM | POA: Diagnosis not present

## 2017-01-21 DIAGNOSIS — L84 Corns and callosities: Secondary | ICD-10-CM | POA: Diagnosis not present

## 2017-01-21 DIAGNOSIS — L603 Nail dystrophy: Secondary | ICD-10-CM | POA: Diagnosis not present

## 2017-01-21 DIAGNOSIS — I739 Peripheral vascular disease, unspecified: Secondary | ICD-10-CM | POA: Diagnosis not present

## 2017-02-04 DIAGNOSIS — E1129 Type 2 diabetes mellitus with other diabetic kidney complication: Secondary | ICD-10-CM | POA: Diagnosis not present

## 2017-02-04 DIAGNOSIS — E119 Type 2 diabetes mellitus without complications: Secondary | ICD-10-CM | POA: Diagnosis not present

## 2017-02-04 DIAGNOSIS — Z1389 Encounter for screening for other disorder: Secondary | ICD-10-CM | POA: Diagnosis not present

## 2017-02-04 DIAGNOSIS — N181 Chronic kidney disease, stage 1: Secondary | ICD-10-CM | POA: Diagnosis not present

## 2017-02-04 DIAGNOSIS — Z0001 Encounter for general adult medical examination with abnormal findings: Secondary | ICD-10-CM | POA: Diagnosis not present

## 2017-02-14 DIAGNOSIS — N182 Chronic kidney disease, stage 2 (mild): Secondary | ICD-10-CM | POA: Diagnosis not present

## 2017-02-14 DIAGNOSIS — E1129 Type 2 diabetes mellitus with other diabetic kidney complication: Secondary | ICD-10-CM | POA: Diagnosis not present

## 2017-02-14 DIAGNOSIS — E782 Mixed hyperlipidemia: Secondary | ICD-10-CM | POA: Diagnosis not present

## 2017-02-14 DIAGNOSIS — R5383 Other fatigue: Secondary | ICD-10-CM | POA: Diagnosis not present

## 2017-03-21 DIAGNOSIS — H5213 Myopia, bilateral: Secondary | ICD-10-CM | POA: Diagnosis not present

## 2017-03-21 DIAGNOSIS — H25813 Combined forms of age-related cataract, bilateral: Secondary | ICD-10-CM | POA: Diagnosis not present

## 2017-03-21 DIAGNOSIS — E119 Type 2 diabetes mellitus without complications: Secondary | ICD-10-CM | POA: Diagnosis not present

## 2017-03-21 DIAGNOSIS — H524 Presbyopia: Secondary | ICD-10-CM | POA: Diagnosis not present

## 2017-04-10 DIAGNOSIS — E1151 Type 2 diabetes mellitus with diabetic peripheral angiopathy without gangrene: Secondary | ICD-10-CM | POA: Diagnosis not present

## 2017-04-10 DIAGNOSIS — L84 Corns and callosities: Secondary | ICD-10-CM | POA: Diagnosis not present

## 2017-04-10 DIAGNOSIS — I739 Peripheral vascular disease, unspecified: Secondary | ICD-10-CM | POA: Diagnosis not present

## 2017-04-10 DIAGNOSIS — L603 Nail dystrophy: Secondary | ICD-10-CM | POA: Diagnosis not present

## 2017-06-08 ENCOUNTER — Emergency Department (HOSPITAL_COMMUNITY)
Admission: EM | Admit: 2017-06-08 | Discharge: 2017-06-08 | Disposition: A | Discharge status: Home / Self Care | Payer: Medicare Other | Attending: Emergency Medicine | Admitting: Emergency Medicine

## 2017-06-08 ENCOUNTER — Encounter (HOSPITAL_COMMUNITY): Payer: Self-pay

## 2017-06-08 DIAGNOSIS — E119 Type 2 diabetes mellitus without complications: Secondary | ICD-10-CM | POA: Diagnosis not present

## 2017-06-08 DIAGNOSIS — R410 Disorientation, unspecified: Secondary | ICD-10-CM | POA: Insufficient documentation

## 2017-06-08 DIAGNOSIS — I10 Essential (primary) hypertension: Secondary | ICD-10-CM | POA: Insufficient documentation

## 2017-06-08 DIAGNOSIS — R350 Frequency of micturition: Secondary | ICD-10-CM | POA: Diagnosis not present

## 2017-06-08 DIAGNOSIS — R4182 Altered mental status, unspecified: Secondary | ICD-10-CM | POA: Diagnosis present

## 2017-06-08 DIAGNOSIS — Z79899 Other long term (current) drug therapy: Secondary | ICD-10-CM | POA: Insufficient documentation

## 2017-06-08 LAB — URINALYSIS, ROUTINE W REFLEX MICROSCOPIC
BILIRUBIN URINE: NEGATIVE
GLUCOSE, UA: NEGATIVE mg/dL
HGB URINE DIPSTICK: NEGATIVE
Ketones, ur: NEGATIVE mg/dL
Leukocytes, UA: NEGATIVE
Nitrite: NEGATIVE
Protein, ur: NEGATIVE mg/dL
SPECIFIC GRAVITY, URINE: 1.009 (ref 1.005–1.030)
pH: 5 (ref 5.0–8.0)

## 2017-06-08 LAB — I-STAT CHEM 8, ED
BUN: 9 mg/dL (ref 6–20)
CHLORIDE: 99 mmol/L — AB (ref 101–111)
CREATININE: 0.9 mg/dL (ref 0.44–1.00)
Calcium, Ion: 1.21 mmol/L (ref 1.15–1.40)
GLUCOSE: 97 mg/dL (ref 65–99)
HCT: 45 % (ref 36.0–46.0)
Hemoglobin: 15.3 g/dL — ABNORMAL HIGH (ref 12.0–15.0)
Potassium: 3.6 mmol/L (ref 3.5–5.1)
Sodium: 140 mmol/L (ref 135–145)
TCO2: 30 mmol/L (ref 0–100)

## 2017-06-08 NOTE — ED Notes (Signed)
Pt wheeled to waiting room. Pt verbalized understanding of discharge instructions.   

## 2017-06-08 NOTE — ED Triage Notes (Signed)
Patient here from [facility] for confusion and urinary incontinence x2-3 days. Patient denies pain.

## 2017-06-08 NOTE — ED Provider Notes (Signed)
[place] DEPT Provider Note   CSN: [id number] Arrival date & time: 06/08/17  2009     History   Chief Complaint Chief Complaint  Patient presents with  . Altered Mental Status  . Urinary Frequency    HPI Taylor Andrews is a 83 y.o. female.  The history is provided by the patient, the nursing home and medical records. No language interpreter was used.  Altered Mental Status   This is a new problem. The current episode started 12 to 24 hours ago. The problem has not changed since onset.Associated symptoms include confusion. Risk factors: none known. Her past medical history is significant for dementia.    Past Medical History:  Diagnosis Date  . Adenomatous colon polyp    carcinoma in situ  . Chronic abdominal pain   . Dementia   . Diabetes mellitus   . Fatty liver    on [place] normal LFT'S  . GERD (gastroesophageal reflux disease)   . Hyperlipidemia   . Hypertension   . Iron deficiency    Chronic current parameter normal; 4/09 iron was 12 sat 5%  . SDH (subdural hematoma) ([place]) 01/2014   chronic    Patient Active Problem List   Diagnosis Date Noted  . Hypokalemia 07/14/2015  . Encephalopathy acute   . Aspiration pneumonia ([place]) 07/13/2015  . Acute encephalopathy 07/13/2015  . Sepsis ([place]) 07/13/2015  . Dementia 07/13/2015  . HTN (hypertension) 07/13/2015  . DM (diabetes mellitus), type 2 ([place]) 07/13/2015  . Encephalopathy 07/13/2015  . Subdural hematoma ([place]) 01/22/2014  . Lower abdominal pain 12/30/2013  . Hx of adenomatous colonic polyps 12/30/2013  . Anemia 04/03/2013  . IRON DEFICIENCY 06/02/2009  . MILK PRODUCTS ALLERGY 06/02/2009  . COLONIC POLYPS 05/26/2009  . HEMORRHOIDS, INTERNAL 05/26/2009  . ABDOMINAL PAIN 05/26/2009    Past Surgical History:  Procedure Laterality Date  . ABDOMINAL HYSTERECTOMY    . ANKLE SURGERY     Left ;pins  . COLONOSCOPY  8/05   internal hemorrhoids,polyp bengin  . COLONOSCOPY  01/12/2011   [id number] rectum/ 5-mm  polyp at the splenic flexure s/p polypectomy, tattooing, and scarred hepatic flexure, absolutely no evidence of any residual polyp tissue or neoplasm/Scattered left-sided diverticula. Tubular adenoma, no high-grade dysplasia.Repeat TCS due 01/2016  . COLONOSCOPY  06/16/2009   [id number] hemorrhoids, otherwise normal rectum.Flat polypoid lesion at the hepatic flexure, debulked with piecemeal snare polypectomy.  This area was also tattooed Transverse colon and descending colon  flat adenomatous appearing polyps resected with hot snare cautery as well. Tubulovillous adenomas  . COLONOSCOPY  December 2010   Dr. Gala Romney: Flat sessile hepatic flexure polyp status post saline-assisted piecemeal polypectomy. Fragments of adenomatous polyp with high-grade dysplasia.  . COLONOSCOPY  March 2011   Dr. Gala Romney: Minimal persisting polypoid tissue at site of previous piecemeal polypectomy status post hot snare polypectomy. Tubular adenoma, no high-grade dysplasia  . CRANIOTOMY Right 01/23/2014   Procedure: CRANIOTOMY HEMATOMA EVACUATION SUBDURAL;  Surgeon: Eustace Moore, MD;  Location: [facility];  Service: Neurosurgery;  Laterality: Right;  . ESOPHAGOGASTRODUODENOSCOPY  6/05   tiny antral erosin with some deformity of pyloric channel  . VESICOVAGINAL FISTULA CLOSURE W/ TAH      OB History    No data available       Home Medications    Prior to Admission medications   Medication Sig Start Date End Date Taking? Authorizing Provider  ALPRAZolam Duanne Moron) 0.5 MG tablet Take 0.5 mg by mouth 3 (three) times daily as  needed for anxiety.   Yes [provider]  amLODipine (NORVASC) 10 MG tablet Take 10 mg by mouth daily.    Yes [provider]  docusate sodium (COLACE) 100 MG capsule Take 100 mg by mouth 3 (three) times daily.   Yes [provider]  donepezil (ARICEPT) 10 MG tablet Take 10 mg by mouth daily.    Yes [provider]  hydrochlorothiazide 25 MG tablet Take 25 mg by  mouth daily.    Yes [provider]  NAMENDA 5 MG tablet Take 5 mg by mouth 2 (two) times daily.  12/09/13  Yes [provider]  niacin (NIASPAN) 500 MG CR tablet Take 500 mg by mouth daily.  12/23/13  Yes [provider]  Omega-3 Fatty Acids (FISH OIL) 1000 MG CAPS Take 1 capsule by mouth 2 (two) times daily with a meal.    Yes [provider]  ondansetron (ZOFRAN) 4 MG tablet Take 1 tablet (4 mg total) by mouth every 8 (eight) hours as needed for nausea or vomiting. 08/02/16  Yes Venice Marcucci, Corene Cornea, MD  potassium chloride (KLOR-CON) 20 MEQ packet Take 20 mEq by mouth daily.   Yes [provider]  potassium chloride SA (K-DUR,KLOR-CON) 20 MEQ tablet Take 20 mEq by mouth daily. 06/05/17  Yes [provider]    Family History Family History  Problem Relation Age of Onset  . Lupus Sister   . Other Sister        ? Liver cancer    Social History Social History  Substance Use Topics  . Smoking status: Never Smoker  . Smokeless tobacco: Never Used  . Alcohol use No     Allergies   Iohexol and Milk-related compounds   Review of Systems Review of Systems  Genitourinary: Positive for frequency and urgency. Negative for dysuria.  Psychiatric/Behavioral: Positive for confusion.  All other systems reviewed and are negative.    Physical Exam Updated Vital Signs BP (!) 122/56   Pulse 67   Temp (!) 97.3 F (36.3 C) (Oral)   Resp 16   Ht 4' 9.5" (1.461 m)   Wt 54.4 kg (120 lb)   SpO2 96%   BMI 25.52 kg/m   Physical Exam  Constitutional: She appears well-developed and well-nourished.  HENT:  Head: Normocephalic and atraumatic.  Eyes: Conjunctivae and EOM are normal.  Neck: Normal range of motion.  Cardiovascular: Normal rate and regular rhythm.   Pulmonary/Chest: No stridor. No respiratory distress.  Abdominal: She exhibits no distension.  Neurological: She is alert. She is disoriented (to year. knows [place] but not which hospital or  why she is here. is oriented to self. ). No cranial nerve deficit. Coordination normal. GCS eye subscore is 4. GCS verbal subscore is 5. GCS motor subscore is 6.  Nursing note and vitals reviewed.    ED Treatments / Results  Labs (all labs ordered are listed, but only abnormal results are displayed) Labs Reviewed  URINALYSIS, ROUTINE W REFLEX MICROSCOPIC - Abnormal; Notable for the following:       Result Value   Color, Urine STRAW (*)    All other components within normal limits  I-STAT CHEM 8, ED - Abnormal; Notable for the following:    Chloride 99 (*)    Hemoglobin 15.3 (*)    All other components within normal limits    EKG  EKG Interpretation  Date/Time:  Saturday June 08 2017 20:45:02 EDT Ventricular Rate:  70 PR Interval:    QRS  Duration: 92 QT Interval:  426 QTC Calculation: 460 R Axis:   83 Text Interpretation:  Sinus rhythm Borderline right axis deviation Borderline low voltage, extremity leads Baseline wander in lead(s) V1 No significant change since last tracing Confirmed by Merrily Pew [phone]) on 06/08/2017 8:51:30 PM       Radiology No results found.  Procedures Procedures (including critical care time)  Medications Ordered in ED Medications - No data to display   Initial Impression / Assessment and Plan / ED Course  I have reviewed the triage vital signs and the nursing notes.  Pertinent labs & imaging results that were available during my care of the patient were reviewed by me and considered in my medical decision making (see chart for details).    Will eval for causes of increased confusion and urinary symptoms. Stable for likely dc home.   Workup negative. Plan for discharge with close pcp follow up for change in MS. No obvious infectious/metabolic causes here.   Final Clinical Impressions(s) / ED Diagnoses   Final diagnoses:  Urinary frequency  Confusion    New Prescriptions Discharge Medication List as of 06/08/2017  9:33 PM         Keidy Thurgood, Corene Cornea, MD 06/09/17 1523

## 2017-06-24 DIAGNOSIS — H2513 Age-related nuclear cataract, bilateral: Secondary | ICD-10-CM | POA: Diagnosis not present

## 2017-06-24 DIAGNOSIS — E119 Type 2 diabetes mellitus without complications: Secondary | ICD-10-CM | POA: Diagnosis not present

## 2017-07-11 DIAGNOSIS — L84 Corns and callosities: Secondary | ICD-10-CM | POA: Diagnosis not present

## 2017-07-11 DIAGNOSIS — I739 Peripheral vascular disease, unspecified: Secondary | ICD-10-CM | POA: Diagnosis not present

## 2017-07-11 DIAGNOSIS — E1151 Type 2 diabetes mellitus with diabetic peripheral angiopathy without gangrene: Secondary | ICD-10-CM | POA: Diagnosis not present

## 2017-07-11 DIAGNOSIS — L603 Nail dystrophy: Secondary | ICD-10-CM | POA: Diagnosis not present

## 2017-09-03 DIAGNOSIS — I10 Essential (primary) hypertension: Secondary | ICD-10-CM | POA: Diagnosis not present

## 2017-09-03 DIAGNOSIS — E748 Other specified disorders of carbohydrate metabolism: Secondary | ICD-10-CM | POA: Diagnosis not present

## 2017-09-03 DIAGNOSIS — Z23 Encounter for immunization: Secondary | ICD-10-CM | POA: Diagnosis not present

## 2017-09-03 DIAGNOSIS — Z1389 Encounter for screening for other disorder: Secondary | ICD-10-CM | POA: Diagnosis not present

## 2017-11-07 DIAGNOSIS — I739 Peripheral vascular disease, unspecified: Secondary | ICD-10-CM | POA: Diagnosis not present

## 2017-11-07 DIAGNOSIS — E1151 Type 2 diabetes mellitus with diabetic peripheral angiopathy without gangrene: Secondary | ICD-10-CM | POA: Diagnosis not present

## 2018-02-05 DIAGNOSIS — I739 Peripheral vascular disease, unspecified: Secondary | ICD-10-CM | POA: Diagnosis not present

## 2018-02-05 DIAGNOSIS — E1151 Type 2 diabetes mellitus with diabetic peripheral angiopathy without gangrene: Secondary | ICD-10-CM | POA: Diagnosis not present

## 2018-02-05 DIAGNOSIS — L603 Nail dystrophy: Secondary | ICD-10-CM | POA: Diagnosis not present

## 2018-02-05 DIAGNOSIS — L84 Corns and callosities: Secondary | ICD-10-CM | POA: Diagnosis not present

## 2018-04-22 DIAGNOSIS — Z0001 Encounter for general adult medical examination with abnormal findings: Secondary | ICD-10-CM | POA: Diagnosis not present

## 2018-04-22 DIAGNOSIS — I6200 Nontraumatic subdural hemorrhage, unspecified: Secondary | ICD-10-CM | POA: Diagnosis not present

## 2018-04-22 DIAGNOSIS — Z1389 Encounter for screening for other disorder: Secondary | ICD-10-CM | POA: Diagnosis not present

## 2018-04-22 DIAGNOSIS — I10 Essential (primary) hypertension: Secondary | ICD-10-CM | POA: Diagnosis not present

## 2018-04-22 DIAGNOSIS — E119 Type 2 diabetes mellitus without complications: Secondary | ICD-10-CM | POA: Diagnosis not present

## 2018-04-22 DIAGNOSIS — Z0000 Encounter for general adult medical examination without abnormal findings: Secondary | ICD-10-CM | POA: Diagnosis not present

## 2018-05-12 DIAGNOSIS — E1151 Type 2 diabetes mellitus with diabetic peripheral angiopathy without gangrene: Secondary | ICD-10-CM | POA: Diagnosis not present

## 2018-05-12 DIAGNOSIS — L84 Corns and callosities: Secondary | ICD-10-CM | POA: Diagnosis not present

## 2018-05-12 DIAGNOSIS — I739 Peripheral vascular disease, unspecified: Secondary | ICD-10-CM | POA: Diagnosis not present

## 2018-05-12 DIAGNOSIS — L603 Nail dystrophy: Secondary | ICD-10-CM | POA: Diagnosis not present

## 2018-05-24 ENCOUNTER — Other Ambulatory Visit: Payer: Self-pay

## 2018-05-24 ENCOUNTER — Encounter (HOSPITAL_COMMUNITY): Payer: Self-pay | Admitting: *Deleted

## 2018-05-24 ENCOUNTER — Emergency Department (HOSPITAL_COMMUNITY)
Admission: EM | Admit: 2018-05-24 | Discharge: 2018-05-24 | Disposition: A | Discharge status: Home / Self Care | Payer: Medicare Other | Attending: Emergency Medicine | Admitting: Emergency Medicine

## 2018-05-24 DIAGNOSIS — I10 Essential (primary) hypertension: Secondary | ICD-10-CM | POA: Insufficient documentation

## 2018-05-24 DIAGNOSIS — T679XXA Effect of heat and light, unspecified, initial encounter: Secondary | ICD-10-CM | POA: Insufficient documentation

## 2018-05-24 DIAGNOSIS — Z79899 Other long term (current) drug therapy: Secondary | ICD-10-CM | POA: Diagnosis not present

## 2018-05-24 DIAGNOSIS — R000 Tachycardia, unspecified: Secondary | ICD-10-CM | POA: Diagnosis not present

## 2018-05-24 DIAGNOSIS — R0689 Other abnormalities of breathing: Secondary | ICD-10-CM | POA: Diagnosis not present

## 2018-05-24 DIAGNOSIS — E119 Type 2 diabetes mellitus without complications: Secondary | ICD-10-CM | POA: Insufficient documentation

## 2018-05-24 DIAGNOSIS — F0390 Unspecified dementia without behavioral disturbance: Secondary | ICD-10-CM | POA: Insufficient documentation

## 2018-05-24 DIAGNOSIS — R410 Disorientation, unspecified: Secondary | ICD-10-CM | POA: Diagnosis present

## 2018-05-24 DIAGNOSIS — T675XXA Heat exhaustion, unspecified, initial encounter: Secondary | ICD-10-CM | POA: Diagnosis not present

## 2018-05-24 LAB — BASIC METABOLIC PANEL
ANION GAP: 8 (ref 5–15)
BUN: 13 mg/dL (ref 8–23)
CALCIUM: 9.4 mg/dL (ref 8.9–10.3)
CO2: 26 mmol/L (ref 22–32)
CREATININE: 1.13 mg/dL — AB (ref 0.44–1.00)
Chloride: 106 mmol/L (ref 98–111)
GFR calc Af Amer: 51 mL/min — ABNORMAL LOW (ref >60)
GFR, EST NON AFRICAN AMERICAN: 44 mL/min — AB (ref >60)
Glucose, Bld: 137 mg/dL — ABNORMAL HIGH (ref 70–99)
POTASSIUM: 3.4 mmol/L — AB (ref 3.5–5.1)
Sodium: 140 mmol/L (ref 135–145)

## 2018-05-24 LAB — CBC
HCT: 40.6 % (ref 36.0–46.0)
Hemoglobin: 13.3 g/dL (ref 12.0–15.0)
MCH: 28.6 pg (ref 26.0–34.0)
MCHC: 32.8 g/dL (ref 30.0–36.0)
MCV: 87.3 fL (ref 78.0–100.0)
Platelets: 323 K/uL (ref 150–400)
RBC: 4.65 MIL/uL (ref 3.87–5.11)
RDW: 13.9 % (ref 11.5–15.5)
WBC: 9.3 K/uL (ref 4.0–10.5)

## 2018-05-24 MED ORDER — SODIUM CHLORIDE 0.9 % IV SOLN
1000.0000 mL | INTRAVENOUS | Status: DC
  Administered 2018-05-24: 1000 mL via INTRAVENOUS

## 2018-05-24 MED ORDER — SODIUM CHLORIDE 0.9 % IV BOLUS (SEPSIS)
500.0000 mL | Freq: Once | INTRAVENOUS | Status: AC
  Administered 2018-05-24: 500 mL via INTRAVENOUS

## 2018-05-24 NOTE — ED Triage Notes (Addendum)
Pt has hx of dementia and wandered from the group home into the woods today, pt was last seen at group home around 5:30, was found by fire department and rescue at 09:30. Pt confused upon arrival to er, denies any complaints, has hx of dementia. cbg with ems was 161

## 2018-05-24 NOTE — Discharge Instructions (Addendum)
Continue to drink plenty of fluids, return as needed for worsening symptoms

## 2018-05-24 NOTE — ED Provider Notes (Signed)
[facility] EMERGENCY DEPARTMENT Provider Note   CSN: [id number] Arrival date & time: 05/24/18  2150     History   Chief Complaint Chief Complaint  Patient presents with  . Heat Exposure    HPI Taylor Andrews is a 84 y.o. female.  HPI Patient is a resident of a group home.  She wandered away from the group home at about 5:30 PM.  Fire rescue located the patient at about 9:30 PM.  EMS indicated the patient appeared confused per her norm but otherwise had no complaints.  Patient is unable to tell me what brought her here.  She states she feels fine, and denies any complaints. Past Medical History:  Diagnosis Date  . Adenomatous colon polyp    carcinoma in situ  . Chronic abdominal pain   . Dementia   . Diabetes mellitus   . Fatty liver    on [place] normal LFT'S  . GERD (gastroesophageal reflux disease)   . Hyperlipidemia   . Hypertension   . Iron deficiency    Chronic current parameter normal; 4/09 iron was 12 sat 5%  . SDH (subdural hematoma) ([place]) 01/2014   chronic    Patient Active Problem List   Diagnosis Date Noted  . Hypokalemia 07/14/2015  . Encephalopathy acute   . Aspiration pneumonia ([place]) 07/13/2015  . Acute encephalopathy 07/13/2015  . Sepsis ([place]) 07/13/2015  . Dementia 07/13/2015  . HTN (hypertension) 07/13/2015  . DM (diabetes mellitus), type 2 ([place]) 07/13/2015  . Encephalopathy 07/13/2015  . Subdural hematoma ([place]) 01/22/2014  . Lower abdominal pain 12/30/2013  . Hx of adenomatous colonic polyps 12/30/2013  . Anemia 04/03/2013  . IRON DEFICIENCY 06/02/2009  . MILK PRODUCTS ALLERGY 06/02/2009  . COLONIC POLYPS 05/26/2009  . HEMORRHOIDS, INTERNAL 05/26/2009  . ABDOMINAL PAIN 05/26/2009    Past Surgical History:  Procedure Laterality Date  . ABDOMINAL HYSTERECTOMY    . ANKLE SURGERY     Left ;pins  . COLONOSCOPY  8/05   internal hemorrhoids,polyp bengin  . COLONOSCOPY  01/12/2011   XBJ:YNWGNF rectum/ 5-mm polyp at the splenic flexure s/p  polypectomy, tattooing, and scarred hepatic flexure, absolutely no evidence of any residual polyp tissue or neoplasm/Scattered left-sided diverticula. Tubular adenoma, no high-grade dysplasia.Repeat TCS due 01/2016  . COLONOSCOPY  06/16/2009   AOZ:HYQMVHQI hemorrhoids, otherwise normal rectum.Flat polypoid lesion at the hepatic flexure, debulked with piecemeal snare polypectomy.  This area was also tattooed Transverse colon and descending colon  flat adenomatous appearing polyps resected with hot snare cautery as well. Tubulovillous adenomas  . COLONOSCOPY  December 2010   Dr. Gala Romney: Flat sessile hepatic flexure polyp status post saline-assisted piecemeal polypectomy. Fragments of adenomatous polyp with high-grade dysplasia.  . COLONOSCOPY  March 2011   Dr. Gala Romney: Minimal persisting polypoid tissue at site of previous piecemeal polypectomy status post hot snare polypectomy. Tubular adenoma, no high-grade dysplasia  . CRANIOTOMY Right 01/23/2014   Procedure: CRANIOTOMY HEMATOMA EVACUATION SUBDURAL;  Surgeon: Eustace Moore, MD;  Location: [facility];  Service: Neurosurgery;  Laterality: Right;  . ESOPHAGOGASTRODUODENOSCOPY  6/05   tiny antral erosin with some deformity of pyloric channel  . VESICOVAGINAL FISTULA CLOSURE W/ TAH       OB History   None      Home Medications    Prior to Admission medications   Medication Sig Start Date End Date Taking? Authorizing Provider  ALPRAZolam Duanne Moron) 0.5 MG tablet Take 0.5 mg by mouth 3 (three) times daily as needed for  anxiety.    [provider]  amLODipine (NORVASC) 10 MG tablet Take 10 mg by mouth daily.     [provider]  docusate sodium (COLACE) 100 MG capsule Take 100 mg by mouth 3 (three) times daily.    [provider]  donepezil (ARICEPT) 10 MG tablet Take 10 mg by mouth daily.     [provider]  hydrochlorothiazide 25 MG tablet Take 25 mg by mouth daily.     [provider]  NAMENDA 5 MG  tablet Take 5 mg by mouth 2 (two) times daily.  12/09/13   [provider]  niacin (NIASPAN) 500 MG CR tablet Take 500 mg by mouth daily.  12/23/13   [provider]  Omega-3 Fatty Acids (FISH OIL) 1000 MG CAPS Take 1 capsule by mouth 2 (two) times daily with a meal.     [provider]  ondansetron (ZOFRAN) 4 MG tablet Take 1 tablet (4 mg total) by mouth every 8 (eight) hours as needed for nausea or vomiting. 08/02/16   Mesner, Corene Cornea, MD  potassium chloride (KLOR-CON) 20 MEQ packet Take 20 mEq by mouth daily.    [provider]  potassium chloride SA (K-DUR,KLOR-CON) 20 MEQ tablet Take 20 mEq by mouth daily. 06/05/17   [provider]    Family History Family History  Problem Relation Age of Onset  . Lupus Sister   . Other Sister        ? Liver cancer    Social History Social History   Tobacco Use  . Smoking status: Never Smoker  . Smokeless tobacco: Never Used  Substance Use Topics  . Alcohol use: No  . Drug use: No     Allergies   Iohexol and Milk-related compounds   Review of Systems Review of Systems  All other systems reviewed and are negative.    Physical Exam Updated Vital Signs BP (!) 109/54   Pulse 73   Temp 98.9 F (37.2 C) (Oral)   Resp 18   SpO2 100%   Physical Exam  Constitutional: She appears well-developed and well-nourished. No distress.  HENT:  Head: Normocephalic and atraumatic.  Right Ear: External ear normal.  Left Ear: External ear normal.  Eyes: Conjunctivae are normal. Right eye exhibits no discharge. Left eye exhibits no discharge. No scleral icterus.  Neck: Neck supple. No tracheal deviation present.  Cardiovascular: Normal rate, regular rhythm and intact distal pulses.  Pulmonary/Chest: Effort normal and breath sounds normal. No stridor. No respiratory distress. She has no wheezes. She has no rales.  Abdominal: Soft. Bowel sounds are normal. She exhibits no distension. There is no tenderness.  There is no rebound and no guarding.  Musculoskeletal: She exhibits no edema or tenderness.  Neurological: She is alert. She has normal strength. She is disoriented. No cranial nerve deficit (no facial droop, extraocular movements intact, no slurred speech) or sensory deficit. She exhibits normal muscle tone. She displays no seizure activity. Coordination normal. GCS eye subscore is 4. GCS verbal subscore is 4. GCS motor subscore is 6.  Skin: Skin is warm and dry. No rash noted.  Psychiatric: She has a normal mood and affect.  Nursing note and vitals reviewed.    ED Treatments / Results  Labs (all labs ordered are listed, but only abnormal results are displayed) Labs Reviewed  BASIC METABOLIC PANEL - Abnormal; Notable for the following components:      Result Value   Potassium 3.4 (*)    Glucose,  Bld 137 (*)    Creatinine, Ser 1.13 (*)    GFR calc non Af Amer 44 (*)    GFR calc Af Amer 51 (*)    All other components within normal limits  CBC    EKG None  Radiology No results found.  Procedures Procedures (including critical care time)  Medications Ordered in ED Medications  sodium chloride 0.9 % bolus 500 mL (500 mLs Intravenous New Bag/Given 05/24/18 2237)    Followed by  0.9 %  sodium chloride infusion (has no administration in time range)     Initial Impression / Assessment and Plan / ED Course  I have reviewed the triage vital signs and the nursing notes.  Pertinent labs & imaging results that were available during my care of the patient were reviewed by me and considered in my medical decision making (see chart for details).  Clinical Course as of May 24 2320  Sat May 24, 2018  2318 Electrolyte panel notable for mild increase in the creatinine.     [JK]  2319 Normal  CBC [JK]    Clinical Course User Index [JK] Dorie Rank, MD    The patient appears  well despite being out in the heat for several hours.  No signs of severe dehydration or heat exhaustion.   Patient was able to eat and drink without difficulty.  She is stable to return to the care of her group home  Final Clinical Impressions(s) / ED Diagnoses   Final diagnoses:  Heat exposure, initial encounter    ED Discharge Orders    None       Dorie Rank, MD 05/24/18 2321

## 2018-06-04 DIAGNOSIS — I6200 Nontraumatic subdural hemorrhage, unspecified: Secondary | ICD-10-CM | POA: Diagnosis not present

## 2018-06-04 DIAGNOSIS — I10 Essential (primary) hypertension: Secondary | ICD-10-CM | POA: Diagnosis not present

## 2018-06-04 DIAGNOSIS — R350 Frequency of micturition: Secondary | ICD-10-CM | POA: Diagnosis not present

## 2018-06-04 DIAGNOSIS — E782 Mixed hyperlipidemia: Secondary | ICD-10-CM | POA: Diagnosis not present

## 2018-06-26 DIAGNOSIS — H2513 Age-related nuclear cataract, bilateral: Secondary | ICD-10-CM | POA: Diagnosis not present

## 2018-06-26 DIAGNOSIS — E119 Type 2 diabetes mellitus without complications: Secondary | ICD-10-CM | POA: Diagnosis not present

## 2018-08-11 DIAGNOSIS — I739 Peripheral vascular disease, unspecified: Secondary | ICD-10-CM | POA: Diagnosis not present

## 2018-08-11 DIAGNOSIS — L603 Nail dystrophy: Secondary | ICD-10-CM | POA: Diagnosis not present

## 2018-08-11 DIAGNOSIS — E1151 Type 2 diabetes mellitus with diabetic peripheral angiopathy without gangrene: Secondary | ICD-10-CM | POA: Diagnosis not present

## 2018-08-11 DIAGNOSIS — L84 Corns and callosities: Secondary | ICD-10-CM | POA: Diagnosis not present

## 2018-09-18 DIAGNOSIS — Z23 Encounter for immunization: Secondary | ICD-10-CM | POA: Diagnosis not present

## 2018-09-18 DIAGNOSIS — I10 Essential (primary) hypertension: Secondary | ICD-10-CM | POA: Diagnosis not present

## 2018-09-18 DIAGNOSIS — Z1389 Encounter for screening for other disorder: Secondary | ICD-10-CM | POA: Diagnosis not present

## 2018-09-18 DIAGNOSIS — E119 Type 2 diabetes mellitus without complications: Secondary | ICD-10-CM | POA: Diagnosis not present

## 2018-09-18 DIAGNOSIS — K219 Gastro-esophageal reflux disease without esophagitis: Secondary | ICD-10-CM | POA: Diagnosis not present

## 2018-10-13 ENCOUNTER — Other Ambulatory Visit (HOSPITAL_COMMUNITY): Payer: Self-pay | Admitting: Internal Medicine

## 2018-10-13 DIAGNOSIS — Z1231 Encounter for screening mammogram for malignant neoplasm of breast: Secondary | ICD-10-CM

## 2018-10-22 ENCOUNTER — Ambulatory Visit (HOSPITAL_COMMUNITY)
Admission: RE | Admit: 2018-10-22 | Discharge: 2018-10-22 | Disposition: A | Discharge status: Home / Self Care | Payer: Medicare Other | Source: Ambulatory Visit | Attending: Internal Medicine | Admitting: Internal Medicine

## 2018-10-22 DIAGNOSIS — Z1231 Encounter for screening mammogram for malignant neoplasm of breast: Secondary | ICD-10-CM | POA: Insufficient documentation

## 2018-12-10 DIAGNOSIS — N182 Chronic kidney disease, stage 2 (mild): Secondary | ICD-10-CM | POA: Diagnosis not present

## 2018-12-10 DIAGNOSIS — E7849 Other hyperlipidemia: Secondary | ICD-10-CM | POA: Diagnosis not present

## 2019-01-02 DIAGNOSIS — I10 Essential (primary) hypertension: Secondary | ICD-10-CM | POA: Diagnosis not present

## 2019-01-02 DIAGNOSIS — Z1389 Encounter for screening for other disorder: Secondary | ICD-10-CM | POA: Diagnosis not present

## 2019-01-02 DIAGNOSIS — N182 Chronic kidney disease, stage 2 (mild): Secondary | ICD-10-CM | POA: Diagnosis not present

## 2019-01-02 DIAGNOSIS — E119 Type 2 diabetes mellitus without complications: Secondary | ICD-10-CM | POA: Diagnosis not present

## 2019-01-02 DIAGNOSIS — Z0001 Encounter for general adult medical examination with abnormal findings: Secondary | ICD-10-CM | POA: Diagnosis not present

## 2019-06-06 ENCOUNTER — Other Ambulatory Visit: Payer: Self-pay

## 2019-06-06 ENCOUNTER — Emergency Department (HOSPITAL_COMMUNITY)
Admission: EM | Admit: 2019-06-06 | Discharge: 2019-06-06 | Disposition: A | Discharge status: Home / Self Care | Payer: Medicare Other | Attending: Emergency Medicine | Admitting: Emergency Medicine

## 2019-06-06 ENCOUNTER — Encounter (HOSPITAL_COMMUNITY): Payer: Self-pay

## 2019-06-06 DIAGNOSIS — F0390 Unspecified dementia without behavioral disturbance: Secondary | ICD-10-CM | POA: Diagnosis not present

## 2019-06-06 DIAGNOSIS — I959 Hypotension, unspecified: Secondary | ICD-10-CM | POA: Diagnosis not present

## 2019-06-06 DIAGNOSIS — I10 Essential (primary) hypertension: Secondary | ICD-10-CM | POA: Insufficient documentation

## 2019-06-06 DIAGNOSIS — Z9189 Other specified personal risk factors, not elsewhere classified: Secondary | ICD-10-CM | POA: Insufficient documentation

## 2019-06-06 DIAGNOSIS — T679XXA Effect of heat and light, unspecified, initial encounter: Secondary | ICD-10-CM | POA: Diagnosis not present

## 2019-06-06 DIAGNOSIS — E119 Type 2 diabetes mellitus without complications: Secondary | ICD-10-CM | POA: Insufficient documentation

## 2019-06-06 NOTE — ED Triage Notes (Signed)
EMS reports pt is a resident of [facility] home.  Reports a bystander reports they saw pt laying on the side of [address].  They stopped and called 911.  EMS says the family care home had not called in a missing person yet so they assumed pt hadn't been gone very long but says they aren't sure.  Per ems, rcsd was going to notify the care home.    Pt alert, oriented to self, disoriented to place.

## 2019-06-06 NOTE — ED Notes (Signed)
Given food tray.  Niece (POA) at bedside.

## 2019-06-06 NOTE — ED Provider Notes (Signed)
[facility] Emergency Department Provider Note MRN:  [id number]  Arrival date & time: 06/06/19     Chief Complaint   Elopement from family care home   History of Present Illness   Taylor Andrews is a 85 y.o. year-old female with a history of dementia, diabetes presenting to the ED with chief complaint of elopement.  History obtained from EMS as well as patient's care facility.  The employee at the care facility explains that she turned her back on the patient to do some laundry for just a few moments, turned back around and she was gone.  Estimated time away from care facility is a maximum of 30 minutes.  Patient was found sitting on the side of the road by a good [employer]aritan, EMS was called.  Patient is pleasant and cooperative, with no complaints.  I was unable to obtain an accurate HPI, PMH, or ROS due to the patient's dementia.  Level 5 caveat.  Review of Systems  Positive for elopement, dementia.  Patient's Health History    Past Medical History:  Diagnosis Date  . Adenomatous colon polyp    carcinoma in situ  . Chronic abdominal pain   . Dementia ([place])   . Diabetes mellitus   . Fatty liver    on [place] normal LFT'S  . GERD (gastroesophageal reflux disease)   . Hyperlipidemia   . Hypertension   . Iron deficiency    Chronic current parameter normal; 4/09 iron was 12 sat 5%  . SDH (subdural hematoma) ([place]) 01/2014   chronic    Past Surgical History:  Procedure Laterality Date  . ABDOMINAL HYSTERECTOMY    . ANKLE SURGERY     Left ;pins  . COLONOSCOPY  8/05   internal hemorrhoids,polyp bengin  . COLONOSCOPY  01/12/2011   WUJ:WJXBJY rectum/ 5-mm polyp at the splenic flexure s/p polypectomy, tattooing, and scarred hepatic flexure, absolutely no evidence of any residual polyp tissue or neoplasm/Scattered left-sided diverticula. Tubular adenoma, no high-grade dysplasia.Repeat TCS due 01/2016  . COLONOSCOPY  06/16/2009   NWG:NFAOZHYQ hemorrhoids, otherwise normal  rectum.Flat polypoid lesion at the hepatic flexure, debulked with piecemeal snare polypectomy.  This area was also tattooed Transverse colon and descending colon  flat adenomatous appearing polyps resected with hot snare cautery as well. Tubulovillous adenomas  . COLONOSCOPY  December 2010   Dr. Gala Romney: Flat sessile hepatic flexure polyp status post saline-assisted piecemeal polypectomy. Fragments of adenomatous polyp with high-grade dysplasia.  . COLONOSCOPY  March 2011   Dr. Gala Romney: Minimal persisting polypoid tissue at site of previous piecemeal polypectomy status post hot snare polypectomy. Tubular adenoma, no high-grade dysplasia  . CRANIOTOMY Right 01/23/2014   Procedure: CRANIOTOMY HEMATOMA EVACUATION SUBDURAL;  Surgeon: Eustace Moore, MD;  Location: [facility];  Service: Neurosurgery;  Laterality: Right;  . ESOPHAGOGASTRODUODENOSCOPY  6/05   tiny antral erosin with some deformity of pyloric channel  . VESICOVAGINAL FISTULA CLOSURE W/ TAH      Family History  Problem Relation Age of Onset  . Lupus Sister   . Other Sister        ? Liver cancer    Social History   Socioeconomic History  . Marital status: Divorced    Spouse name: Not on file  . Number of children: Not on file  . Years of education: Not on file  . Highest education level: Not on file  Occupational History  . Not on file  Social Needs  . Financial resource strain: Not on  file  . Food insecurity    Worry: Not on file    Inability: Not on file  . Transportation needs    Medical: Not on file    Non-medical: Not on file  Tobacco Use  . Smoking status: Never Smoker  . Smokeless tobacco: Never Used  Substance and Sexual Activity  . Alcohol use: No  . Drug use: No  . Sexual activity: Not on file  Lifestyle  . Physical activity    Days per week: Not on file    Minutes per session: Not on file  . Stress: Not on file  Relationships  . Social Herbalist on phone: Not on file    Gets together: Not on  file    Attends religious service: Not on file    Active member of club or organization: Not on file    Attends meetings of clubs or organizations: Not on file    Relationship status: Not on file  . Intimate partner violence    Fear of current or ex partner: Not on file    Emotionally abused: Not on file    Physically abused: Not on file    Forced sexual activity: Not on file  Other Topics Concern  . Not on file  Social History Narrative  . Not on file     Physical Exam  Vital Signs and Nursing Notes reviewed Vitals:   06/06/19 1217 06/06/19 1235  BP: (!) 137/117   Pulse: 81   Resp: 20   Temp: 98.8 F (37.1 C)   SpO2: 100% 100%    CONSTITUTIONAL: Well-appearing, NAD NEURO: Awake, alert, conversant, oriented to name, pleasantly confused, moving all extremities EYES:  eyes equal and reactive ENT/NECK:  no LAD, no JVD CARDIO: Regular rate, well-perfused, normal S1 and S2 PULM:  CTAB no wheezing or rhonchi GI/GU:  normal bowel sounds, non-distended, non-tender MSK/SPINE:  No gross deformities, no edema SKIN:  no rash, atraumatic PSYCH:  Appropriate speech and behavior  Diagnostic and Interventional Summary    EKG Interpretation  Date/Time:  Saturday June 06 2019 12:20:31 EDT Ventricular Rate:  97 PR Interval:    QRS Duration: 87 QT Interval:  351 QTC Calculation: 444 R Axis:   80 Text Interpretation:  Sinus rhythm Confirmed by Gerlene Fee [phone]) on 06/06/2019 12:31:14 PM      Labs Reviewed - No data to display  No orders to display    Medications - No data to display   Procedures Critical Care  ED Course and Medical Decision Making  I have reviewed the triage vital signs and the nursing notes.  Pertinent labs & imaging results that were available during my care of the patient were reviewed by me and considered in my medical decision making (see below for details).  Escape from care facility for a maximum of 30 minutes.  Patient is with normal vital  signs, normal neurological exam, at her baseline from a mental status perspective.  She has no evidence of trauma.  Will observe her in the emergency department for 1 or 2 hours and then likely discharge.  No indication for further testing or imaging at this time.  Blood glucose is normal, EKG is unchanged.  Upon reevaluation, patient is eating a cheeseburger, smiling, conversant, patient's niece is at bedside and confirms that she is at her baseline.  Niece is comfortable with discharge and return to her care facility.  Patient was observed in the emergency department for over 1 hour  with normal vital signs during that time.  Barth Kirks. Sedonia Small, [place] [email or web address]  Final Clinical Impressions(s) / ED Diagnoses     ICD-10-CM   1. At risk for elopement from healthcare setting  Z91.89     ED Discharge Orders    None         Maudie Flakes, MD 06/06/19 1323

## 2019-06-06 NOTE — ED Notes (Signed)
cbg 164 with ems

## 2019-06-06 NOTE — ED Notes (Signed)
Spoke with Alain Marion, staff member at [facility] and was told pt was gone approx 69min.  Reports she was doing the laundry, realized she was missing, and she called 911.

## 2019-06-06 NOTE — Discharge Instructions (Addendum)
You were evaluated in the Emergency Department and after careful evaluation, we did not find any emergent condition requiring admission or further testing in the hospital. ° °Please return to the Emergency Department if you experience any worsening of your condition.  We encourage you to follow up with a primary care provider.  Thank you for allowing us to be a part of your care. °

## 2019-10-21 DIAGNOSIS — R05 Cough: Secondary | ICD-10-CM | POA: Diagnosis not present

## 2019-11-11 DIAGNOSIS — R05 Cough: Secondary | ICD-10-CM | POA: Diagnosis not present

## 2019-11-18 DIAGNOSIS — R05 Cough: Secondary | ICD-10-CM | POA: Diagnosis not present

## 2019-11-25 DIAGNOSIS — R05 Cough: Secondary | ICD-10-CM | POA: Diagnosis not present

## 2019-12-02 DIAGNOSIS — R05 Cough: Secondary | ICD-10-CM | POA: Diagnosis not present

## 2019-12-09 DIAGNOSIS — R05 Cough: Secondary | ICD-10-CM | POA: Diagnosis not present

## 2019-12-16 DIAGNOSIS — R05 Cough: Secondary | ICD-10-CM | POA: Diagnosis not present

## 2019-12-23 DIAGNOSIS — R0982 Postnasal drip: Secondary | ICD-10-CM | POA: Diagnosis not present

## 2019-12-30 DIAGNOSIS — R0982 Postnasal drip: Secondary | ICD-10-CM | POA: Diagnosis not present

## 2020-01-06 DIAGNOSIS — J310 Chronic rhinitis: Secondary | ICD-10-CM | POA: Diagnosis not present

## 2020-01-10 DIAGNOSIS — J310 Chronic rhinitis: Secondary | ICD-10-CM | POA: Diagnosis not present

## 2020-01-10 DIAGNOSIS — R05 Cough: Secondary | ICD-10-CM | POA: Diagnosis not present

## 2020-01-17 DIAGNOSIS — J310 Chronic rhinitis: Secondary | ICD-10-CM | POA: Diagnosis not present

## 2020-01-25 DIAGNOSIS — J310 Chronic rhinitis: Secondary | ICD-10-CM | POA: Diagnosis not present

## 2020-01-25 DIAGNOSIS — R05 Cough: Secondary | ICD-10-CM | POA: Diagnosis not present

## 2020-01-31 DIAGNOSIS — J310 Chronic rhinitis: Secondary | ICD-10-CM | POA: Diagnosis not present

## 2020-01-31 DIAGNOSIS — R05 Cough: Secondary | ICD-10-CM | POA: Diagnosis not present

## 2020-02-08 DIAGNOSIS — J310 Chronic rhinitis: Secondary | ICD-10-CM | POA: Diagnosis not present

## 2020-02-08 DIAGNOSIS — R519 Headache, unspecified: Secondary | ICD-10-CM | POA: Diagnosis not present

## 2020-02-28 DIAGNOSIS — R05 Cough: Secondary | ICD-10-CM | POA: Diagnosis not present

## 2020-03-15 DIAGNOSIS — J310 Chronic rhinitis: Secondary | ICD-10-CM | POA: Diagnosis not present

## 2020-04-18 DIAGNOSIS — R05 Cough: Secondary | ICD-10-CM | POA: Diagnosis not present

## 2020-04-18 DIAGNOSIS — J310 Chronic rhinitis: Secondary | ICD-10-CM | POA: Diagnosis not present

## 2020-05-17 DIAGNOSIS — E119 Type 2 diabetes mellitus without complications: Secondary | ICD-10-CM | POA: Diagnosis not present

## 2020-05-17 DIAGNOSIS — J690 Pneumonitis due to inhalation of food and vomit: Secondary | ICD-10-CM | POA: Diagnosis not present

## 2020-05-17 DIAGNOSIS — Z6823 Body mass index (BMI) 23.0-23.9, adult: Secondary | ICD-10-CM | POA: Diagnosis not present

## 2020-05-17 DIAGNOSIS — I10 Essential (primary) hypertension: Secondary | ICD-10-CM | POA: Diagnosis not present

## 2020-05-17 DIAGNOSIS — Z0001 Encounter for general adult medical examination with abnormal findings: Secondary | ICD-10-CM | POA: Diagnosis not present

## 2020-05-17 DIAGNOSIS — Z1389 Encounter for screening for other disorder: Secondary | ICD-10-CM | POA: Diagnosis not present

## 2020-05-17 DIAGNOSIS — E46 Unspecified protein-calorie malnutrition: Secondary | ICD-10-CM | POA: Diagnosis not present

## 2020-05-18 ENCOUNTER — Other Ambulatory Visit: Payer: Self-pay | Admitting: Internal Medicine

## 2020-05-18 DIAGNOSIS — R4182 Altered mental status, unspecified: Secondary | ICD-10-CM

## 2020-05-18 DIAGNOSIS — Z0001 Encounter for general adult medical examination with abnormal findings: Secondary | ICD-10-CM | POA: Diagnosis not present

## 2020-05-18 DIAGNOSIS — E782 Mixed hyperlipidemia: Secondary | ICD-10-CM | POA: Diagnosis not present

## 2020-05-18 DIAGNOSIS — E538 Deficiency of other specified B group vitamins: Secondary | ICD-10-CM | POA: Diagnosis not present

## 2020-05-18 DIAGNOSIS — R634 Abnormal weight loss: Secondary | ICD-10-CM | POA: Diagnosis not present

## 2020-05-18 DIAGNOSIS — E559 Vitamin D deficiency, unspecified: Secondary | ICD-10-CM | POA: Diagnosis not present

## 2020-05-18 DIAGNOSIS — Z6823 Body mass index (BMI) 23.0-23.9, adult: Secondary | ICD-10-CM | POA: Diagnosis not present

## 2020-05-20 DIAGNOSIS — E782 Mixed hyperlipidemia: Secondary | ICD-10-CM | POA: Diagnosis not present

## 2020-06-08 DIAGNOSIS — R519 Headache, unspecified: Secondary | ICD-10-CM | POA: Diagnosis not present

## 2020-06-09 ENCOUNTER — Other Ambulatory Visit: Payer: Self-pay

## 2020-06-09 ENCOUNTER — Ambulatory Visit (HOSPITAL_COMMUNITY)
Admission: RE | Admit: 2020-06-09 | Discharge: 2020-06-09 | Disposition: A | Discharge status: Home / Self Care | Payer: Medicare Other | Source: Ambulatory Visit | Attending: Internal Medicine | Admitting: Internal Medicine

## 2020-06-09 DIAGNOSIS — R4182 Altered mental status, unspecified: Secondary | ICD-10-CM | POA: Diagnosis present

## 2020-06-09 DIAGNOSIS — I7090 Unspecified atherosclerosis: Secondary | ICD-10-CM | POA: Diagnosis not present

## 2020-06-09 DIAGNOSIS — R410 Disorientation, unspecified: Secondary | ICD-10-CM | POA: Diagnosis not present

## 2020-06-09 DIAGNOSIS — G319 Degenerative disease of nervous system, unspecified: Secondary | ICD-10-CM | POA: Diagnosis not present

## 2020-06-17 DIAGNOSIS — J3489 Other specified disorders of nose and nasal sinuses: Secondary | ICD-10-CM | POA: Diagnosis not present

## 2020-06-17 DIAGNOSIS — R05 Cough: Secondary | ICD-10-CM | POA: Diagnosis not present

## 2020-06-28 DIAGNOSIS — I10 Essential (primary) hypertension: Secondary | ICD-10-CM | POA: Diagnosis not present

## 2020-06-28 DIAGNOSIS — E559 Vitamin D deficiency, unspecified: Secondary | ICD-10-CM | POA: Diagnosis not present

## 2020-06-28 DIAGNOSIS — G309 Alzheimer's disease, unspecified: Secondary | ICD-10-CM | POA: Diagnosis not present

## 2020-06-28 DIAGNOSIS — E782 Mixed hyperlipidemia: Secondary | ICD-10-CM | POA: Diagnosis not present

## 2020-06-28 DIAGNOSIS — R413 Other amnesia: Secondary | ICD-10-CM | POA: Diagnosis not present

## 2020-07-01 DIAGNOSIS — J310 Chronic rhinitis: Secondary | ICD-10-CM | POA: Diagnosis not present

## 2020-07-01 DIAGNOSIS — R05 Cough: Secondary | ICD-10-CM | POA: Diagnosis not present

## 2020-08-08 ENCOUNTER — Ambulatory Visit: Payer: Medicare Other | Admitting: Neurology

## 2020-08-23 DIAGNOSIS — R070 Pain in throat: Secondary | ICD-10-CM | POA: Diagnosis not present

## 2020-08-23 DIAGNOSIS — J300 Vasomotor rhinitis: Secondary | ICD-10-CM | POA: Diagnosis not present

## 2020-12-07 DIAGNOSIS — R059 Cough, unspecified: Secondary | ICD-10-CM | POA: Diagnosis not present

## 2020-12-07 DIAGNOSIS — R0602 Shortness of breath: Secondary | ICD-10-CM | POA: Diagnosis not present

## 2021-03-07 DIAGNOSIS — R070 Pain in throat: Secondary | ICD-10-CM | POA: Diagnosis not present

## 2021-04-04 DIAGNOSIS — Z6821 Body mass index (BMI) 21.0-21.9, adult: Secondary | ICD-10-CM | POA: Diagnosis not present

## 2021-04-04 DIAGNOSIS — R2681 Unsteadiness on feet: Secondary | ICD-10-CM | POA: Diagnosis not present

## 2021-05-16 DIAGNOSIS — Z593 Problems related to living in residential institution: Secondary | ICD-10-CM | POA: Diagnosis not present

## 2021-05-16 DIAGNOSIS — I10 Essential (primary) hypertension: Secondary | ICD-10-CM | POA: Diagnosis not present

## 2021-06-18 ENCOUNTER — Other Ambulatory Visit: Payer: Self-pay

## 2021-06-18 ENCOUNTER — Emergency Department (HOSPITAL_COMMUNITY): Payer: Medicare Other

## 2021-06-18 ENCOUNTER — Encounter (HOSPITAL_COMMUNITY): Payer: Self-pay

## 2021-06-18 ENCOUNTER — Inpatient Hospital Stay (HOSPITAL_COMMUNITY)
Admission: EM | Admit: 2021-06-18 | Discharge: 2021-06-20 | DRG: 871 | Disposition: A | Discharge status: Home Health | Payer: Medicare Other | Attending: Internal Medicine | Admitting: Internal Medicine

## 2021-06-18 DIAGNOSIS — G309 Alzheimer's disease, unspecified: Secondary | ICD-10-CM | POA: Diagnosis present

## 2021-06-18 DIAGNOSIS — Z832 Family history of diseases of the blood and blood-forming organs and certain disorders involving the immune mechanism: Secondary | ICD-10-CM | POA: Diagnosis not present

## 2021-06-18 DIAGNOSIS — G9341 Metabolic encephalopathy: Secondary | ICD-10-CM | POA: Diagnosis present

## 2021-06-18 DIAGNOSIS — E876 Hypokalemia: Secondary | ICD-10-CM | POA: Diagnosis not present

## 2021-06-18 DIAGNOSIS — E161 Other hypoglycemia: Secondary | ICD-10-CM | POA: Diagnosis not present

## 2021-06-18 DIAGNOSIS — K219 Gastro-esophageal reflux disease without esophagitis: Secondary | ICD-10-CM | POA: Diagnosis present

## 2021-06-18 DIAGNOSIS — Z79899 Other long term (current) drug therapy: Secondary | ICD-10-CM

## 2021-06-18 DIAGNOSIS — R29818 Other symptoms and signs involving the nervous system: Secondary | ICD-10-CM | POA: Diagnosis not present

## 2021-06-18 DIAGNOSIS — Z91011 Allergy to milk products: Secondary | ICD-10-CM

## 2021-06-18 DIAGNOSIS — E860 Dehydration: Secondary | ICD-10-CM | POA: Diagnosis not present

## 2021-06-18 DIAGNOSIS — F0390 Unspecified dementia without behavioral disturbance: Secondary | ICD-10-CM | POA: Diagnosis present

## 2021-06-18 DIAGNOSIS — Z91041 Radiographic dye allergy status: Secondary | ICD-10-CM

## 2021-06-18 DIAGNOSIS — A419 Sepsis, unspecified organism: Principal | ICD-10-CM | POA: Diagnosis present

## 2021-06-18 DIAGNOSIS — F0280 Dementia in other diseases classified elsewhere without behavioral disturbance: Secondary | ICD-10-CM | POA: Diagnosis present

## 2021-06-18 DIAGNOSIS — R32 Unspecified urinary incontinence: Secondary | ICD-10-CM | POA: Diagnosis present

## 2021-06-18 DIAGNOSIS — R531 Weakness: Secondary | ICD-10-CM | POA: Diagnosis not present

## 2021-06-18 DIAGNOSIS — I10 Essential (primary) hypertension: Secondary | ICD-10-CM | POA: Diagnosis not present

## 2021-06-18 DIAGNOSIS — R410 Disorientation, unspecified: Secondary | ICD-10-CM | POA: Diagnosis not present

## 2021-06-18 DIAGNOSIS — Z20822 Contact with and (suspected) exposure to covid-19: Secondary | ICD-10-CM | POA: Diagnosis not present

## 2021-06-18 DIAGNOSIS — R6520 Severe sepsis without septic shock: Secondary | ICD-10-CM | POA: Diagnosis present

## 2021-06-18 DIAGNOSIS — E785 Hyperlipidemia, unspecified: Secondary | ICD-10-CM | POA: Diagnosis present

## 2021-06-18 DIAGNOSIS — J9811 Atelectasis: Secondary | ICD-10-CM | POA: Diagnosis not present

## 2021-06-18 DIAGNOSIS — E162 Hypoglycemia, unspecified: Secondary | ICD-10-CM | POA: Diagnosis not present

## 2021-06-18 DIAGNOSIS — E119 Type 2 diabetes mellitus without complications: Secondary | ICD-10-CM | POA: Diagnosis not present

## 2021-06-18 DIAGNOSIS — Z743 Need for continuous supervision: Secondary | ICD-10-CM | POA: Diagnosis not present

## 2021-06-18 DIAGNOSIS — R404 Transient alteration of awareness: Secondary | ICD-10-CM | POA: Diagnosis not present

## 2021-06-18 DIAGNOSIS — G9340 Encephalopathy, unspecified: Secondary | ICD-10-CM | POA: Diagnosis present

## 2021-06-18 LAB — COMPREHENSIVE METABOLIC PANEL
ALT: 18 U/L (ref 0–44)
AST: 25 U/L (ref 15–41)
Albumin: 4.1 g/dL (ref 3.5–5.0)
Alkaline Phosphatase: 65 U/L (ref 38–126)
Anion gap: 7 (ref 5–15)
BUN: 33 mg/dL — ABNORMAL HIGH (ref 8–23)
CO2: 26 mmol/L (ref 22–32)
Calcium: 9.2 mg/dL (ref 8.9–10.3)
Chloride: 107 mmol/L (ref 98–111)
Creatinine, Ser: 1.06 mg/dL — ABNORMAL HIGH (ref 0.44–1.00)
GFR, Estimated: 52 mL/min — ABNORMAL LOW (ref >60)
Glucose, Bld: 115 mg/dL — ABNORMAL HIGH (ref 70–99)
Potassium: 3.7 mmol/L (ref 3.5–5.1)
Sodium: 140 mmol/L (ref 135–145)
Total Bilirubin: 1.1 mg/dL (ref 0.3–1.2)
Total Protein: 8.3 g/dL — ABNORMAL HIGH (ref 6.5–8.1)

## 2021-06-18 LAB — CBG MONITORING, ED: Glucose-Capillary: 98 mg/dL (ref 70–99)

## 2021-06-18 LAB — URINALYSIS, COMPLETE (UACMP) WITH MICROSCOPIC
Bacteria, UA: NONE SEEN
Bilirubin Urine: NEGATIVE
Glucose, UA: NEGATIVE mg/dL
Hgb urine dipstick: NEGATIVE
Ketones, ur: 5 mg/dL — AB
Leukocytes,Ua: NEGATIVE
Nitrite: NEGATIVE
Protein, ur: NEGATIVE mg/dL
Specific Gravity, Urine: 1.027 (ref 1.005–1.030)
pH: 5 (ref 5.0–8.0)

## 2021-06-18 LAB — CBC WITH DIFFERENTIAL/PLATELET
Band Neutrophils: 23 %
Basophils Absolute: 0 10*3/uL (ref 0.0–0.1)
Basophils Relative: 0 %
Eosinophils Absolute: 0 10*3/uL (ref 0.0–0.5)
Eosinophils Relative: 0 %
HCT: 49.3 % — ABNORMAL HIGH (ref 36.0–46.0)
Hemoglobin: 15.5 g/dL — ABNORMAL HIGH (ref 12.0–15.0)
Lymphocytes Relative: 3 %
Lymphs Abs: 0.5 10*3/uL — ABNORMAL LOW (ref 0.7–4.0)
MCH: 29 pg (ref 26.0–34.0)
MCHC: 31.4 g/dL (ref 30.0–36.0)
MCV: 92.1 fL (ref 80.0–100.0)
Metamyelocytes Relative: 4 %
Monocytes Absolute: 0.8 10*3/uL (ref 0.1–1.0)
Monocytes Relative: 5 %
Neutro Abs: 13.8 10*3/uL — ABNORMAL HIGH (ref 1.7–7.7)
Neutrophils Relative %: 65 %
Platelets: 309 10*3/uL (ref 150–400)
RBC: 5.35 MIL/uL — ABNORMAL HIGH (ref 3.87–5.11)
RDW: 14.1 % (ref 11.5–15.5)
WBC: 15.7 10*3/uL — ABNORMAL HIGH (ref 4.0–10.5)
nRBC: 0 % (ref 0.0–0.2)

## 2021-06-18 LAB — LACTIC ACID, PLASMA
Lactic Acid, Venous: 3.6 mmol/L (ref 0.5–1.9)
Lactic Acid, Venous: 2.7 mmol/L (ref 0.5–1.9)

## 2021-06-18 MED ORDER — SODIUM CHLORIDE 0.9 % IV BOLUS (SEPSIS)
1000.0000 mL | Freq: Once | INTRAVENOUS | Status: AC
  Administered 2021-06-18: 1000 mL via INTRAVENOUS

## 2021-06-18 MED ORDER — LACTATED RINGERS IV SOLN: INTRAVENOUS | Status: DC

## 2021-06-18 MED ORDER — VANCOMYCIN HCL 500 MG/100ML IV SOLN
500.0000 mg | INTRAVENOUS | Status: DC
  Filled 2021-06-18: qty 100

## 2021-06-18 MED ORDER — SODIUM CHLORIDE 0.9 % IV SOLN: 2.0000 g | INTRAVENOUS | Status: DC

## 2021-06-18 MED ORDER — VANCOMYCIN HCL IN DEXTROSE 1-5 GM/200ML-% IV SOLN
1000.0000 mg | Freq: Once | INTRAVENOUS | Status: AC
  Administered 2021-06-18: 1000 mg via INTRAVENOUS
  Filled 2021-06-18: qty 200

## 2021-06-18 MED ORDER — SODIUM CHLORIDE 0.9 % IV SOLN
2.0000 g | Freq: Once | INTRAVENOUS | Status: AC
  Administered 2021-06-18: 2 g via INTRAVENOUS
  Filled 2021-06-18: qty 2

## 2021-06-18 NOTE — ED Notes (Signed)
Date and time results received: 06/18/21 1215   Test: lactic acid  Critical Value: 3.6  Name of Provider Notified: Dr. Kathrynn Humble  Orders Received? Or Actions Taken?: notified

## 2021-06-18 NOTE — ED Notes (Signed)
Right IV noted to be infiltrated. IV removed.

## 2021-06-18 NOTE — Progress Notes (Signed)
Pharmacy Antibiotic Note  Taylor Andrews is a 87 y.o. female admitted on 06/18/2021 with sepsis.  Pharmacy has been consulted for Cefepime and Vancomycin dosing.  Plan: Cefepime 2gm IV q24hrs Vancomycin 500 mg IV Q 24 hrs. Goal AUC 400-550. Expected AUC: 438, Css min predicted 12.8 SCr used: 1.06   Height: '5\' 1"'$  (154.9 cm) Weight: 54.4 kg (119 lb 14.9 oz) IBW/kg (Calculated) : 47.8  Temp (24hrs), Avg:98.8 F (37.1 C), Min:98.8 F (37.1 C), Max:98.8 F (37.1 C)  Recent Labs  Lab 06/18/21 1147  WBC 15.7*  CREATININE 1.06*  LATICACIDVEN 3.6*    Estimated Creatinine Clearance: 29.8 mL/min (A) (by C-G formula based on SCr of 1.06 mg/dL (H)).    Allergies  Allergen Reactions   Iohexol      Desc: PT STATES THAT THE CONTRAST THAT SHE HAD IN 2004 MADE HER SICK, PT COULD NOT SPECIFY REACTION TYPES    Milk-Related Compounds     Lactose Intolerant     Antimicrobials this admission: Vanc 8/14 >>  Cefepime 8/14 >>   Dose adjustments this admission:   Microbiology results:  BCx: pending  UCx: pending   Sputum: pending   MRSA PCR: pending  Thank you for allowing pharmacy to be a part of this patient's care.  Taylor Andrews A 06/18/2021 1:23 PM

## 2021-06-18 NOTE — H&P (Signed)
History and Physical  Taylor Andrews [id number] DOB: October 26, 1934 DOA: 06/18/2021  Referring physician: Dr Kathrynn Humble, ED physician PCP: Redmond School, MD  Outpatient Specialists:  Patient Coming From: [place]  Chief Complaint: AMS  HPI: Taylor Andrews is a 87 y.o. female with a history of patient has altered mental status and is unable to provide history, history obtained via chart.  Has a history of diabetes, hypertension, dementia, GERD.  Patient was in her normal state last night, but was quite sleepy when she awoke.  She did have breakfast but became more sleepy and unresponsive again.  There have been no falls, complaints of headaches.  She has had similar episodes been these resolved on their own.  She does have a history of urinary tract infections, some of which have led to altered mental status similar to this but not as severe.  No new medications or medication dose adjustments.  Emergency Department Course: White count 15.7.  Lactic acid 3.6 and 2.7 on repeat.  Creatinine 1.06 chest x-ray normal.  CT head normal.  UA amber in color, leukocyte and nitrite negative.  Review of Systems:  Unable to obtain  Past Medical History:  Diagnosis Date   Adenomatous colon polyp    carcinoma in situ   Chronic abdominal pain    Dementia ([place])    Diabetes mellitus    Fatty liver    on [place] normal LFT'S   GERD (gastroesophageal reflux disease)    Hyperlipidemia    Hypertension    Iron deficiency    Chronic current parameter normal; 4/09 iron was 12 sat 5%   SDH (subdural hematoma) ([place]) 01/2014   chronic   Past Surgical History:  Procedure Laterality Date   ABDOMINAL HYSTERECTOMY     ANKLE SURGERY     Left ;pins   COLONOSCOPY  8/05   internal hemorrhoids,polyp bengin   COLONOSCOPY  01/12/2011   [id number] rectum/ 5-mm polyp at the splenic flexure s/p polypectomy, tattooing, and scarred hepatic flexure, absolutely no evidence of any residual polyp tissue or  neoplasm/Scattered left-sided diverticula. Tubular adenoma, no high-grade dysplasia.Repeat TCS due 01/2016   COLONOSCOPY  06/16/2009   [id number] hemorrhoids, otherwise normal rectum.Flat polypoid lesion at the hepatic flexure, debulked with piecemeal snare polypectomy.  This area was also tattooed Transverse colon and descending colon  flat adenomatous appearing polyps resected with hot snare cautery as well. Tubulovillous adenomas   COLONOSCOPY  December 2010   Dr. Gala Romney: Flat sessile hepatic flexure polyp status post saline-assisted piecemeal polypectomy. Fragments of adenomatous polyp with high-grade dysplasia.   COLONOSCOPY  March 2011   Dr. Gala Romney: Minimal persisting polypoid tissue at site of previous piecemeal polypectomy status post hot snare polypectomy. Tubular adenoma, no high-grade dysplasia   CRANIOTOMY Right 01/23/2014   Procedure: CRANIOTOMY HEMATOMA EVACUATION SUBDURAL;  Surgeon: Eustace Moore, MD;  Location: [facility];  Service: Neurosurgery;  Laterality: Right;   ESOPHAGOGASTRODUODENOSCOPY  6/05   tiny antral erosin with some deformity of pyloric channel   VESICOVAGINAL FISTULA CLOSURE W/ TAH     Social History:  reports that she has never smoked. She has never used smokeless tobacco. She reports that she does not drink alcohol and does not use drugs. Patient lives at home  Allergies  Allergen Reactions   Milk-Related Compounds Other (See Comments)    Lactose Intolerant    Iohexol Other (See Comments)     Desc: PT Roxana THAT SHE HAD IN 2004 MADE HER  SICK, PT COULD NOT SPECIFY REACTION TYPES     Family History  Problem Relation Age of Onset   Lupus Sister    Other Sister        ? Liver cancer      Prior to Admission medications   Medication Sig Start Date End Date Taking? Authorizing Provider  amLODipine (NORVASC) 10 MG tablet Take 10 mg by mouth daily.    Yes [provider]  docusate sodium (COLACE) 100 MG capsule Take 100 mg by  mouth 3 (three) times daily.   Yes [provider]  donepezil (ARICEPT) 10 MG tablet Take 10 mg by mouth daily.    Yes [provider]  hydrochlorothiazide 25 MG tablet Take 25 mg by mouth daily.    Yes [provider]  LORazepam (ATIVAN) 0.5 MG tablet Take 0.5 mg by mouth at bedtime. 06/13/21  Yes [provider]  memantine (NAMENDA) 10 MG tablet Take 10 mg by mouth 2 (two) times daily. 06/13/21  Yes [provider]  niacin (NIASPAN) 500 MG CR tablet Take 500 mg by mouth daily.  12/23/13  Yes [provider]  Omega-3 Fatty Acids (FISH OIL) 1000 MG CAPS Take 1 capsule by mouth 2 (two) times daily with a meal.    Yes [provider]  oxybutynin (DITROPAN) 5 MG tablet Take 5 mg by mouth 2 (two) times daily. 06/13/21  Yes [provider]  potassium chloride SA (K-DUR,KLOR-CON) 20 MEQ tablet Take 20 mEq by mouth daily. 06/05/17  Yes [provider]  ondansetron (ZOFRAN) 4 MG tablet Take 1 tablet (4 mg total) by mouth every 8 (eight) hours as needed for nausea or vomiting. Patient not taking: Reported on 06/18/2021 08/02/16   Mesner, Corene Cornea, MD    Physical Exam: BP 138/67   Pulse 63   Temp 98.8 F (37.1 C) (Oral)   Resp 18   Ht '5\' 1"'$  (1.549 m)   Wt 54.4 kg   SpO2 100%   BMI 22.66 kg/m   General: Older female. Mildly somnolent. Arouses with verbal stimuli. No acute cardiopulmonary distress.  HEENT: Normocephalic atraumatic.  Right and left ears normal in appearance.  Pupils equal, round, reactive to light. Extraocular muscles are intact. Sclerae anicteric and noninjected.  Moist mucosal membranes. No mucosal lesions.  Neck: Neck supple without lymphadenopathy. No carotid bruits. No masses palpated.  Cardiovascular: Regular rate with normal S1-S2 sounds. No murmurs, rubs, gallops auscultated. No JVD.  Respiratory: Good respiratory effort with no wheezes, rales, rhonchi. Lungs clear to auscultation bilaterally.  No accessory  muscle use. Abdomen: Soft, nontender, nondistended. Active bowel sounds. No masses or hepatosplenomegaly  Skin: No rashes, lesions, or ulcerations.  Dry, warm to touch. 2+ dorsalis pedis and radial pulses. Musculoskeletal: No calf or leg pain. All major joints not erythematous nontender.  No upper or lower joint deformation.  Good ROM.  No contractures  Psychiatric: Unable to assess Neurologic: Unless to assess           Labs on Admission: I have personally reviewed following labs and imaging studies  CBC: Recent Labs  Lab 06/18/21 1147  WBC 15.7*  NEUTROABS 13.8*  HGB 15.5*  HCT 49.3*  MCV 92.1  PLT [id number]   Basic Metabolic Panel: Recent Labs  Lab 06/18/21 1147  NA 140  K 3.7  CL 107  CO2 26  GLUCOSE 115*  BUN 33*  CREATININE 1.06*  CALCIUM 9.2   GFR: Estimated Creatinine Clearance: 29.8 mL/min (A) (by C-G  formula based on SCr of 1.06 mg/dL (H)). Liver Function Tests: Recent Labs  Lab 06/18/21 1147  AST 25  ALT 18  ALKPHOS 65  BILITOT 1.1  PROT 8.3*  ALBUMIN 4.1   No results for input(s): LIPASE, AMYLASE in the last 168 hours. No results for input(s): AMMONIA in the last 168 hours. Coagulation Profile: No results for input(s): INR, PROTIME in the last 168 hours. Cardiac Enzymes: No results for input(s): CKTOTAL, CKMB, CKMBINDEX, TROPONINI in the last 168 hours. BNP (last 3 results) No results for input(s): PROBNP in the last 8760 hours. HbA1C: No results for input(s): HGBA1C in the last 72 hours. CBG: Recent Labs  Lab 06/18/21 1116  GLUCAP 98   Lipid Profile: No results for input(s): CHOL, HDL, LDLCALC, TRIG, CHOLHDL, LDLDIRECT in the last 72 hours. Thyroid Function Tests: No results for input(s): TSH, T4TOTAL, FREET4, T3FREE, THYROIDAB in the last 72 hours. Anemia Panel: No results for input(s): VITAMINB12, FOLATE, FERRITIN, TIBC, IRON, RETICCTPCT in the last 72 hours. Urine analysis:    Component Value Date/Time   COLORURINE AMBER (A)  06/18/2021 1107   APPEARANCEUR HAZY (A) 06/18/2021 1107   LABSPEC 1.027 06/18/2021 1107   PHURINE 5.0 06/18/2021 1107   GLUCOSEU NEGATIVE 06/18/2021 1107   HGBUR NEGATIVE 06/18/2021 1107   BILIRUBINUR NEGATIVE 06/18/2021 1107   KETONESUR 5 (A) 06/18/2021 1107   PROTEINUR NEGATIVE 06/18/2021 1107   UROBILINOGEN 0.2 07/13/2015 1024   NITRITE NEGATIVE 06/18/2021 1107   LEUKOCYTESUR NEGATIVE 06/18/2021 1107   Sepsis Labs: '@LABRCNTIP'$ (procalcitonin:4,lacticidven:4) ) Recent Results (from the past 240 hour(s))  Culture, blood (single)     Status: None (Preliminary result)   Collection Time: 06/18/21  1:56 PM   Specimen: Right Antecubital; Blood  Result Value Ref Range Status   Specimen Description   Final    RIGHT ANTECUBITAL BOTTLES DRAWN AEROBIC AND ANAEROBIC   Special Requests   Final    Blood Culture adequate volume Performed at [facility], [address] [place], [place] [address]    Culture PENDING  Incomplete   Report Status PENDING  Incomplete     Radiological Exams on Admission: CT HEAD WO CONTRAST (5MM)  Result Date: 06/18/2021 CLINICAL DATA:  Altered mental status. EXAM: CT HEAD WITHOUT CONTRAST TECHNIQUE: Contiguous axial images were obtained from the base of the skull through the vertex without intravenous contrast. COMPARISON:  CT head dated 06/09/2020. FINDINGS: Brain: No evidence of acute infarction, hemorrhage, hydrocephalus, extra-axial collection or mass lesion/mass effect. There is moderate cerebral volume loss with associated ex vacuo dilatation. Periventricular white matter hypoattenuation likely represents chronic small vessel ischemic disease. Vascular: There are vascular calcifications in the carotid siphons. Skull: The patient is status post a right frontal craniotomy. Negative for fracture. Sinuses/Orbits: No acute finding. Other: None. IMPRESSION: No acute intracranial process. Electronically Signed   By: Zerita Boers M.D.   On: 06/18/2021 12:03   DG Chest  Port 1 View  Result Date: 06/18/2021 CLINICAL DATA:  Altered mental status EXAM: PORTABLE CHEST 1 VIEW COMPARISON:  08/02/2016 FINDINGS: Lung volumes are small and there is mild left basilar atelectasis. Lungs are otherwise clear. No pneumothorax or pleural effusion. Cardiac size within normal limits. Osseous structures are age-appropriate. IMPRESSION: No active disease. Electronically Signed   By: Fidela Salisbury M.D.   On: 06/18/2021 12:57     Assessment/Plan: Active Problems:   Acute encephalopathy   Sepsis ([place])   Dementia ([place])   HTN (hypertension)   DM (diabetes mellitus), type 2 ([place])  This patient was discussed with the ED physician, including pertinent vitals, physical exam findings, labs, and imaging.  We also discussed care given by the ED provider.  Acute encephalopathy Likely secondary to sepsis, however neurology recommended MRI brain to rule out stroke. Sepsis No primary source right now Procalcitonin Continue broad-spectrum antibiotics Blood culture and urine culture pending  Dementia Continue home regiment Hypertension Continue home regimen other than diuretic which we will hold Diabetes Sliding scale insulin Hemoglobin A1c in the morning  DVT prophylaxis: Lovenox Consultants: None Code Status: Full code Family Communication: None Disposition Plan: Patient should be ready able to return back to the group home following admission   Truett Mainland, DO

## 2021-06-18 NOTE — Consult Note (Signed)
[facility] TeleNeurology Consult Services  Stat Consult  Date of Service:   06/18/2021 13:45:00  Diagnosis:       G93.49 - Encephalopathy Multifactorial  Impression PT with a h/o AD, HTN, HLD presents with AMS, decreased responsiveness and more lethargy today, increased confusion. Pt has leukocytosis and elevated lactate, suspect sepsis. Etiology increased lethargy and confusion is likely sepsis + baseline Alzheimers.  Ddx includes stroke but much less likely and not candidate for thrombolytics or NIR due to unclear LKW and baseline functional status.  Recommend ASA 81 mg daily, medical workup and treatment, MRI brain w/o.    CT HEAD: Showed No Acute Hemorrhage or Acute Core Infarct Reviewed  Our recommendations are outlined below.  Diagnostic Studies: Recommend MRI brain without contrast  Laboratory Studies: Recommend Lipid panel Hemoglobin A1c  Medication: Initiate Aspirin 81 mg daily  Nursing Recommendations: Telemetry, IV Fluids, avoid dextrose containing fluids, Maintain euglycemia Neuro checks q4 hrs x 24 hrs and then per shift Head of bed 30 degrees  Consultations: Recommend Speech therapy if failed dysphagia screen Physical therapy/Occupational therapy  DVT Prophylaxis: Choice of Primary Team  Disposition: Neurology will follow   Labs WBC 15.7, lactate 3.6  Metrics: [facility] Notification Time: 06/18/2021 13:43:03 Stamp Time: 06/18/2021 13:45:00 Callback Response Time: 06/18/2021 13:47:56   ----------------------------------------------------------------------------------------------------  Chief Complaint: speech changes, confusion  History of Present Illness: Patient is a 87 year old Female.   87 yo with a h/o HTN, HLD, Alzheimer's disease presents to ED from nursing home with AMS and questionable aphasia since waking up this morning. Pt was reportedly more interactive yesterday, but has decreased cooperation and  interaction. Today she was very sleepy.  She is resisting examination in the ER. States her name but does not otherwise cooperate with exam.   Past Medical History:      Hypertension      Hyperlipidemia      There is NO history of Diabetes Mellitus      There is NO history of Atrial Fibrillation      There is NO history of Coronary Artery Disease      There is NO history of Stroke      There is NO history of Covid-19  Anticoagulant use:  No  Antiplatelet use: No   Examination: BP(117/104), Pulse(77), Blood Glucose(115) 1A: Level of Consciousness - Requires repeated stimulation to arouse + 2 1B: Ask Month and Age - Could Not Answer Either Question Correctly + 2 1C: Blink Eyes & Squeeze Hands - Performs 0 Tasks + 2 2: Test Horizontal Extraocular Movements - Normal + 0 3: Test Visual Fields - No Visual Loss + 0 4: Test Facial Palsy (Use Grimace if Obtunded) - Normal symmetry + 0 5A: Test Left Arm Motor Drift - Drift, hits bed + 2 5B: Test Right Arm Motor Drift - Drift, hits bed + 2 6A: Test Left Leg Motor Drift - Some Effort Against Gravity + 2 6B: Test Right Leg Motor Drift - Some Effort Against Gravity + 2 7: Test Limb Ataxia (FNF/Heel-Shin) - No Ataxia + 0 8: Test Sensation - Normal; No sensory loss + 0 9: Test Language/Aphasia - Normal; No aphasia + 0 10: Test Dysarthria - Normal + 0 11: Test Extinction/Inattention - No abnormality + 0  NIHSS Score: 14     Patient / Family was informed the Neurology Consult would occur via TeleHealth consult by way of interactive audio and video telecommunications and consented to receiving care in this manner.  Patient  is being evaluated for possible acute neurologic impairment and high probability of imminent or life - threatening deterioration.I spent total of 26 minutes providing care to this patient, including time for face to face visit via telemedicine, review of medical records, imaging studies and discussion of findings with  providers, the patient and / or family.   Dr Jeralene Huff   [facility] [phone]  Case [id number]

## 2021-06-18 NOTE — ED Triage Notes (Signed)
Pt to er room number 6, per ems pt is from a group home, states that she was normal at breakfast and is here for altered mental status, states that pt is sleepy, pt arouses to verbal stim.  MD at bedside.

## 2021-06-18 NOTE — Plan of Care (Signed)
Patient arrived on unit. Alert but confused. Responds to name. Disoriented to place, time and situation. Patient settled into room. Will continue to monitor closely.

## 2021-06-18 NOTE — Sepsis Progress Note (Signed)
[facility] is following this code sepsis ?

## 2021-06-18 NOTE — ED Notes (Signed)
Date and time results received: 06/18/21  (use smartphrase ".now" to insert current time)  Test: Lactic acid Critical Value: 2.7  Name of Provider Notified: nanavati  Orders Received? Or Actions Taken?: n/a

## 2021-06-18 NOTE — ED Notes (Signed)
Pt is a hard stick for blood and IV's.

## 2021-06-18 NOTE — ED Notes (Signed)
Neuro consult completed. 

## 2021-06-18 NOTE — ED Provider Notes (Addendum)
[facility] EMERGENCY DEPARTMENT Provider Note   CSN: [id number] Arrival date & time: 06/18/21  1014     History Chief Complaint  Patient presents with   Altered Mental Status    Taylor Andrews is a 87 y.o. female.  HPI     87 year old female comes in with chief complaint of altered mental status.  Level 5 caveat for altered mental status.  Patient resides at [name].  She has history of diabetes, dementia, hypertension and hyperlipidemia.  Patient unable to provide any meaningful history.  I spoke with patient's care provider at [name].  Taylor Andrews?  Informs me that patient was doing fine last night.  This morning when she woke up she was just " too sleepy".  She did carry on to have breakfast, but afterwards she became more sleepy and unresponsive again.  Patient had not had any falls and there were no complaints of headaches.  She has had similar episode in the past, but turned around by herself.  She has had UTIs in the past as well that has led to change in mental status, but not to this extent.  No pain meds or Ativan given today.  Patient is responding to query.  She is following simple commands.  She is oriented to self only at this time.  Past Medical History:  Diagnosis Date   Adenomatous colon polyp    carcinoma in situ   Chronic abdominal pain    Dementia ([place])    Diabetes mellitus    Fatty liver    on [place] normal LFT'S   GERD (gastroesophageal reflux disease)    Hyperlipidemia    Hypertension    Iron deficiency    Chronic current parameter normal; 4/09 iron was 12 sat 5%   SDH (subdural hematoma) ([place]) 01/2014   chronic    Patient Active Problem List   Diagnosis Date Noted   Hypokalemia 07/14/2015   Encephalopathy acute    Aspiration pneumonia ([place]) 07/13/2015   Acute encephalopathy 07/13/2015   Sepsis ([place]) 07/13/2015   Dementia ([place]) 07/13/2015   HTN (hypertension) 07/13/2015   DM (diabetes mellitus), type 2  ([place]) 07/13/2015   Encephalopathy 07/13/2015   Subdural hematoma ([place]) 01/22/2014   Lower abdominal pain 12/30/2013   Hx of adenomatous colonic polyps 12/30/2013   Anemia 04/03/2013   IRON DEFICIENCY 06/02/2009   MILK PRODUCTS ALLERGY 06/02/2009   COLONIC POLYPS 05/26/2009   HEMORRHOIDS, INTERNAL 05/26/2009   ABDOMINAL PAIN 05/26/2009    Past Surgical History:  Procedure Laterality Date   ABDOMINAL HYSTERECTOMY     ANKLE SURGERY     Left ;pins   COLONOSCOPY  8/05   internal hemorrhoids,polyp bengin   COLONOSCOPY  01/12/2011   [id number] rectum/ 5-mm polyp at the splenic flexure s/p polypectomy, tattooing, and scarred hepatic flexure, absolutely no evidence of any residual polyp tissue or neoplasm/Scattered left-sided diverticula. Tubular adenoma, no high-grade dysplasia.Repeat TCS due 01/2016   COLONOSCOPY  06/16/2009   [id number] hemorrhoids, otherwise normal rectum.Flat polypoid lesion at the hepatic flexure, debulked with piecemeal snare polypectomy.  This area was also tattooed Transverse colon and descending colon  flat adenomatous appearing polyps resected with hot snare cautery as well. Tubulovillous adenomas   COLONOSCOPY  December 2010   Dr. Gala Romney: Flat sessile hepatic flexure polyp status post saline-assisted piecemeal polypectomy. Fragments of adenomatous polyp with high-grade dysplasia.   COLONOSCOPY  March 2011   Dr. Gala Romney: Minimal persisting polypoid tissue at site of previous  piecemeal polypectomy status post hot snare polypectomy. Tubular adenoma, no high-grade dysplasia   CRANIOTOMY Right 01/23/2014   Procedure: CRANIOTOMY HEMATOMA EVACUATION SUBDURAL;  Surgeon: Eustace Moore, MD;  Location: [facility];  Service: Neurosurgery;  Laterality: Right;   ESOPHAGOGASTRODUODENOSCOPY  6/05   tiny antral erosin with some deformity of pyloric channel   VESICOVAGINAL FISTULA CLOSURE W/ TAH       OB History   No obstetric history on file.     Family History  Problem  Relation Age of Onset   Taylor Andrews    Taylor Andrews        ? Liver cancer    Social History   Tobacco Use   Smoking status: Never   Smokeless tobacco: Never  Substance Use Topics   Alcohol use: No   Drug use: No    Home Medications Prior to Admission medications   Medication Sig Start Date End Date Taking? Authorizing Provider  amLODipine (NORVASC) 10 MG tablet Take 10 mg by mouth daily.    Yes [provider]  docusate sodium (COLACE) 100 MG capsule Take 100 mg by mouth 3 (three) times daily.   Yes [provider]  donepezil (ARICEPT) 10 MG tablet Take 10 mg by mouth daily.    Yes [provider]  hydrochlorothiazide 25 MG tablet Take 25 mg by mouth daily.    Yes [provider]  LORazepam (ATIVAN) 0.5 MG tablet Take 0.5 mg by mouth at bedtime. 06/13/21  Yes [provider]  memantine (NAMENDA) 10 MG tablet Take 10 mg by mouth 2 (two) times daily. 06/13/21  Yes [provider]  niacin (NIASPAN) 500 MG CR tablet Take 500 mg by mouth daily.  12/23/13  Yes [provider]  Omega-3 Fatty Acids (FISH OIL) 1000 MG CAPS Take 1 capsule by mouth 2 (two) times daily with a meal.    Yes [provider]  oxybutynin (DITROPAN) 5 MG tablet Take 5 mg by mouth 2 (two) times daily. 06/13/21  Yes [provider]  potassium chloride SA (K-DUR,KLOR-CON) 20 MEQ tablet Take 20 mEq by mouth daily. 06/05/17  Yes [provider]  ondansetron (ZOFRAN) 4 MG tablet Take 1 tablet (4 mg total) by mouth every 8 (eight) hours as needed for nausea or vomiting. Patient not taking: Reported on 06/18/2021 08/02/16   Andrews, Taylor Cornea, MD    Allergies    Milk-related compounds and Iohexol  Review of Systems   Review of Systems  Unable to perform ROS: Mental status change   Physical Exam Updated Vital Signs BP (!) 117/104   Pulse 83   Temp 98.8 F (37.1 C) (Oral)   Resp 17   Ht '5\' 1"'$  (1.549 m)   Wt 54.4 kg   SpO2 93%   BMI 22.66  kg/m   Physical Exam Vitals and nursing note reviewed.  Constitutional:      Appearance: She is well-developed.     Comments: Somnolence  HENT:     Head: Atraumatic.  Eyes:     Extraocular Movements: Extraocular movements intact.     Pupils: Pupils are equal, round, and reactive to light.     Comments: No nystagmus  Cardiovascular:     Rate and Rhythm: Normal rate.  Pulmonary:     Effort: Pulmonary effort is normal.  Musculoskeletal:     Cervical back: Neck supple.  Skin:    General: Skin is warm and dry.  Neurological:     Mental Status: She  is disoriented.     Comments: Moving all 4 extremities, pupils are 3 mm and equal    ED Results / Procedures / Treatments   Labs (all labs ordered are listed, but only abnormal results are displayed) Labs Reviewed  COMPREHENSIVE METABOLIC PANEL - Abnormal; Notable for the following components:      Result Value   Glucose, Bld 115 (*)    BUN 33 (*)    Creatinine, Ser 1.06 (*)    Total Protein 8.3 (*)    GFR, Estimated 52 (*)    All Taylor components within normal limits  CBC WITH DIFFERENTIAL/PLATELET - Abnormal; Notable for the following components:   WBC 15.7 (*)    RBC 5.35 (*)    Hemoglobin 15.5 (*)    HCT 49.3 (*)    Neutro Abs 13.8 (*)    Lymphs Abs 0.5 (*)    All Taylor components within normal limits  URINALYSIS, COMPLETE (UACMP) WITH MICROSCOPIC - Abnormal; Notable for the following components:   Color, Urine AMBER (*)    APPearance HAZY (*)    Ketones, ur 5 (*)    All Taylor components within normal limits  LACTIC ACID, PLASMA - Abnormal; Notable for the following components:   Lactic Acid, Venous 3.6 (*)    All Taylor components within normal limits  LACTIC ACID, PLASMA - Abnormal; Notable for the following components:   Lactic Acid, Venous 2.7 (*)    All Taylor components within normal limits  CULTURE, BLOOD (SINGLE)  URINE CULTURE  SARS CORONAVIRUS 2 (TAT 6-24 HRS)  CBG MONITORING, ED     EKG None  Radiology CT HEAD WO CONTRAST (5MM)  Result Date: 06/18/2021 CLINICAL DATA:  Altered mental status. EXAM: CT HEAD WITHOUT CONTRAST TECHNIQUE: Contiguous axial images were obtained from the base of the skull through the vertex without intravenous contrast. COMPARISON:  CT head dated 06/09/2020. FINDINGS: Brain: No evidence of acute infarction, hemorrhage, hydrocephalus, extra-axial collection or mass lesion/mass effect. There is moderate cerebral volume loss with associated ex vacuo dilatation. Periventricular white matter hypoattenuation likely represents chronic small vessel ischemic disease. Vascular: There are vascular calcifications in the carotid siphons. Skull: The patient is status post a right frontal craniotomy. Negative for fracture. Sinuses/Orbits: No acute finding. Taylor: None. IMPRESSION: No acute intracranial process. Electronically Signed   By: Zerita Boers M.D.   On: 06/18/2021 12:03   DG Chest Port 1 View  Result Date: 06/18/2021 CLINICAL DATA:  Altered mental status EXAM: PORTABLE CHEST 1 VIEW COMPARISON:  08/02/2016 FINDINGS: Lung volumes are small and there is mild left basilar atelectasis. Lungs are otherwise clear. No pneumothorax or pleural effusion. Cardiac size within normal limits. Osseous structures are age-appropriate. IMPRESSION: No active disease. Electronically Signed   By: Fidela Salisbury M.D.   On: 06/18/2021 12:57    Procedures .Critical Care  Date/Time: 06/18/2021 1:44 PM Performed by: Varney Biles, MD Authorized by: Varney Biles, MD   Critical care provider statement:    Critical care time (minutes):  52   Critical care was necessary to treat or prevent imminent or life-threatening deterioration of the following conditions:  CNS failure or compromise   Critical care was time spent personally by me on the following activities:  Discussions with consultants, evaluation of patient's response to treatment, examination of patient, ordering and  performing treatments and interventions, ordering and review of laboratory studies, ordering and review of radiographic studies, pulse oximetry, re-evaluation of patient's condition, obtaining history from patient or surrogate, review of  old charts and development of treatment plan with patient or surrogate   Medications Ordered in ED Medications  lactated ringers infusion ( Intravenous New Bag/Given 06/18/21 1411)  sodium chloride 0.9 % bolus 1,000 mL (1,000 mLs Intravenous New Bag/Given 06/18/21 1411)  vancomycin (VANCOCIN) IVPB 1000 mg/200 mL premix (1,000 mg Intravenous New Bag/Given 06/18/21 1405)  ceFEPIme (MAXIPIME) 2 g in sodium chloride 0.9 % 100 mL IVPB (has no administration in time range)  vancomycin (VANCOREADY) IVPB 500 mg/100 mL (has no administration in time range)  ceFEPIme (MAXIPIME) 2 g in sodium chloride 0.9 % 100 mL IVPB (0 g Intravenous Stopped 06/18/21 1407)    ED Course  I have reviewed the triage vital signs and the nursing notes.  Pertinent labs & imaging results that were available during my care of the patient were reviewed by me and considered in my medical decision making (see chart for details).  Clinical Course as of 06/18/21 1449  Sun Jun 18, 2021  1342 Patient reassessed at 12 PM and again at 1:40 PM.  Family at the bedside at 1:40 PM.  I also spoke with patient's niece over the phone.  They respond that patient does have dementia but able to hold simple conversations.  She is able to normally recognize people.  On both of my assessments, patient is oriented to self only.  She answers by giving me her name.  Her labs have revealed elevated white count with bandemia, within normal appearing UA.  Urine culture sent.  Urine is dark appearing.  Antibiotics initiated.  Taylor possibility includes aphasia.  Neurology will be consulted for this undifferentiated sudden altered mental status.  Stroke is high in the differential diagnosis as well.  [AN]  1449  Teleneurology recommends that patient get treated for most likely sepsis.  They suspect the underlying acute mental status change are because of it.  They will put in the recommendation shortly. [AN]    Clinical Course User Index [AN] Varney Biles, MD   MDM Rules/Calculators/A&P                          87 year old comes in with chief complaint of disorientation.  Differential diagnosis includes global amnesia, TIA/stroke, UTI, electrolyte abnormality, intracranial bleed, hypoglycemia.  We will start with basic blood work-up, CT head and reassess.   Final Clinical Impression(s) / ED Diagnoses Final diagnoses:  Disorientation  Severe sepsis [facility])    Rx / DC Orders ED Discharge Orders     None        Varney Biles, MD 06/18/21 Viola, Cage Gupton, MD 06/18/21 1449

## 2021-06-18 NOTE — ED Notes (Signed)
Called Acute Care to give report, Shawnee Knapp reports she will have to call this nurse back.

## 2021-06-19 ENCOUNTER — Inpatient Hospital Stay (HOSPITAL_COMMUNITY): Payer: Medicare Other

## 2021-06-19 LAB — LIPID PANEL
Cholesterol: 166 mg/dL (ref 0–200)
HDL: 42 mg/dL (ref >40)
LDL Cholesterol: 118 mg/dL — ABNORMAL HIGH (ref 0–99)
Total CHOL/HDL Ratio: 4 RATIO
Triglycerides: 30 mg/dL (ref <150)
VLDL: 6 mg/dL (ref 0–40)

## 2021-06-19 LAB — BASIC METABOLIC PANEL
Anion gap: 8 (ref 5–15)
BUN: 30 mg/dL — ABNORMAL HIGH (ref 8–23)
CO2: 26 mmol/L (ref 22–32)
Calcium: 8.4 mg/dL — ABNORMAL LOW (ref 8.9–10.3)
Chloride: 107 mmol/L (ref 98–111)
Creatinine, Ser: 0.78 mg/dL (ref 0.44–1.00)
GFR, Estimated: >60 mL/min (ref >60)
Glucose, Bld: 104 mg/dL — ABNORMAL HIGH (ref 70–99)
Potassium: 2.7 mmol/L — CL (ref 3.5–5.1)
Sodium: 141 mmol/L (ref 135–145)

## 2021-06-19 LAB — HEMOGLOBIN A1C
Hgb A1c MFr Bld: 5.2 % (ref 4.8–5.6)
Mean Plasma Glucose: 102.54 mg/dL

## 2021-06-19 LAB — CBC
HCT: 35.8 % — ABNORMAL LOW (ref 36.0–46.0)
Hemoglobin: 12.1 g/dL (ref 12.0–15.0)
MCH: 30.3 pg (ref 26.0–34.0)
MCHC: 33.8 g/dL (ref 30.0–36.0)
MCV: 89.7 fL (ref 80.0–100.0)
Platelets: 247 10*3/uL (ref 150–400)
RBC: 3.99 MIL/uL (ref 3.87–5.11)
RDW: 13.9 % (ref 11.5–15.5)
WBC: 14.5 10*3/uL — ABNORMAL HIGH (ref 4.0–10.5)
nRBC: 0 % (ref 0.0–0.2)

## 2021-06-19 LAB — GLUCOSE, CAPILLARY
Glucose-Capillary: 93 mg/dL (ref 70–99)
Glucose-Capillary: 78 mg/dL (ref 70–99)
Glucose-Capillary: 111 mg/dL — ABNORMAL HIGH (ref 70–99)
Glucose-Capillary: 83 mg/dL (ref 70–99)

## 2021-06-19 LAB — PROCALCITONIN: Procalcitonin: 2.82 ng/mL

## 2021-06-19 LAB — SARS CORONAVIRUS 2 (TAT 6-24 HRS): SARS Coronavirus 2: NEGATIVE

## 2021-06-19 MED ORDER — ACETAMINOPHEN 650 MG RE SUPP: 650.0000 mg | Freq: Four times a day (QID) | RECTAL | Status: DC | PRN

## 2021-06-19 MED ORDER — POTASSIUM CHLORIDE CRYS ER 20 MEQ PO TBCR
40.0000 meq | EXTENDED_RELEASE_TABLET | Freq: Two times a day (BID) | ORAL | Status: DC
  Administered 2021-06-19 – 2021-06-20 (×3): 40 meq via ORAL
  Filled 2021-06-19 (×3): qty 2

## 2021-06-19 MED ORDER — VANCOMYCIN HCL 750 MG/150ML IV SOLN
750.0000 mg | INTRAVENOUS | Status: DC
  Administered 2021-06-19: 750 mg via INTRAVENOUS
  Filled 2021-06-19: qty 150

## 2021-06-19 MED ORDER — DOCUSATE SODIUM 100 MG PO CAPS
100.0000 mg | ORAL_CAPSULE | Freq: Three times a day (TID) | ORAL | Status: DC
  Filled 2021-06-19 (×4): qty 1

## 2021-06-19 MED ORDER — ONDANSETRON HCL 4 MG/2ML IJ SOLN: 4.0000 mg | Freq: Four times a day (QID) | INTRAMUSCULAR | Status: DC | PRN

## 2021-06-19 MED ORDER — ONDANSETRON HCL 4 MG PO TABS: 4.0000 mg | ORAL_TABLET | Freq: Four times a day (QID) | ORAL | Status: DC | PRN

## 2021-06-19 MED ORDER — ENOXAPARIN SODIUM 40 MG/0.4ML IJ SOSY
40.0000 mg | PREFILLED_SYRINGE | INTRAMUSCULAR | Status: DC
  Administered 2021-06-20: 40 mg via SUBCUTANEOUS
  Filled 2021-06-19: qty 0.4

## 2021-06-19 MED ORDER — DONEPEZIL HCL 5 MG PO TABS
10.0000 mg | ORAL_TABLET | Freq: Every day | ORAL | Status: DC
  Administered 2021-06-19 – 2021-06-20 (×2): 10 mg via ORAL
  Filled 2021-06-19 (×2): qty 2

## 2021-06-19 MED ORDER — ACETAMINOPHEN 325 MG PO TABS: 650.0000 mg | ORAL_TABLET | Freq: Four times a day (QID) | ORAL | Status: DC | PRN

## 2021-06-19 MED ORDER — OXYBUTYNIN CHLORIDE 5 MG PO TABS
5.0000 mg | ORAL_TABLET | Freq: Two times a day (BID) | ORAL | Status: DC
  Administered 2021-06-19 – 2021-06-20 (×3): 5 mg via ORAL
  Filled 2021-06-19 (×3): qty 1

## 2021-06-19 MED ORDER — SODIUM CHLORIDE 0.9 % IV SOLN
2.0000 g | Freq: Two times a day (BID) | INTRAVENOUS | Status: DC
  Administered 2021-06-19 – 2021-06-20 (×2): 2 g via INTRAVENOUS
  Filled 2021-06-19 (×3): qty 2

## 2021-06-19 MED ORDER — POTASSIUM CHLORIDE CRYS ER 20 MEQ PO TBCR: 20.0000 meq | EXTENDED_RELEASE_TABLET | Freq: Every day | ORAL | Status: DC

## 2021-06-19 MED ORDER — NIACIN ER (ANTIHYPERLIPIDEMIC) 500 MG PO TBCR
500.0000 mg | EXTENDED_RELEASE_TABLET | Freq: Every day | ORAL | Status: DC
  Administered 2021-06-20: 500 mg via ORAL
  Filled 2021-06-19 (×3): qty 1

## 2021-06-19 MED ORDER — ENOXAPARIN SODIUM 30 MG/0.3ML IJ SOSY
30.0000 mg | PREFILLED_SYRINGE | INTRAMUSCULAR | Status: DC
  Administered 2021-06-19: 30 mg via SUBCUTANEOUS
  Filled 2021-06-19: qty 0.3

## 2021-06-19 MED ORDER — AMLODIPINE BESYLATE 5 MG PO TABS
10.0000 mg | ORAL_TABLET | Freq: Every day | ORAL | Status: DC
  Administered 2021-06-19 – 2021-06-20 (×2): 10 mg via ORAL
  Filled 2021-06-19 (×2): qty 2

## 2021-06-19 MED ORDER — MEMANTINE HCL 10 MG PO TABS
10.0000 mg | ORAL_TABLET | Freq: Two times a day (BID) | ORAL | Status: DC
  Administered 2021-06-19 – 2021-06-20 (×3): 10 mg via ORAL
  Filled 2021-06-19 (×3): qty 1

## 2021-06-19 MED ORDER — LORAZEPAM 2 MG/ML IJ SOLN: 1.0000 mg | Freq: Once | INTRAMUSCULAR | Status: DC

## 2021-06-19 MED ORDER — INSULIN ASPART 100 UNIT/ML IJ SOLN: 0.0000 [IU] | Freq: Three times a day (TID) | INTRAMUSCULAR | Status: DC

## 2021-06-19 MED ORDER — STROKE: EARLY STAGES OF RECOVERY BOOK: Freq: Once | Status: DC

## 2021-06-19 NOTE — Progress Notes (Signed)
[facility] PROGRESS NOTE    Taylor Andrews  [id number] DOB: [birth year] DOA: 06/18/2021 PCP: Redmond School, MD      Brief Narrative:  Taylor Andrews is a 87 y.o. F with DM, dementia, lives in SNF, HTN who presented with encephalopathy.     Assessment & Plan:  Possible sepsis Presented without SIRS criteria, normal chest x-ray, urinalysis not suspicious for UTI.  No obvious source. Elevated lactate may be from dehydration -Continue vancomycin and cefepime - FOllow culture data   Acute metabolic encpehalopathy Suspect this is from dehydration, possible infection.  Baseline unknown. - Treat the underlying condition - OBtain MRI brain  Hypokalemia -Supplement potassium  Dehydration -Continue IV fluids when IV access regained  Hypertension Blood pressure normal - Continue amlodipine  Diabetes Glucoses are normal, this is a chart history, she probably has had her diabetes resolved with diet alone. - Continue sliding scale corrections as needed  Dementia Donepezil and memantine  Urinary incontinence -Continue oxybutynin               Disposition: Status is: Inpatient  Remains inpatient appropriate because:Persistent severe electrolyte disturbances, Altered mental status, Ongoing diagnostic testing needed not appropriate for outpatient work up, and IV treatments appropriate due to intensity of illness or inability to take PO  Dispo: The patient is from: SNF              Anticipated d/c is to: SNF              Patient currently is not medically stable to d/c.   Difficult to place patient No       Level of care: Telemetry       MDM: The below labs and imaging reports were reviewed and summarized above.  Medication management as above.     DVT prophylaxis: enoxaparin (LOVENOX) injection 30 mg Start: 06/19/21 0600  Code Status: FULL Family Communication:     Consultants:     Procedures:     Antimicrobials:        Culture data:              Subjective: No headache, chest pain, abdominal pain.  She is pleasantly confused.  No fever overnight.  No respiratory distress.  Objective: Vitals:   06/18/21 2100 06/18/21 2231 06/19/21 0158 06/19/21 0420  BP: (!) 122/49 (!) 146/63 (!) 141/36 (!) 143/47  Pulse: 79 (!) 58 69 71  Resp: '20 18 18 16  '$ Temp: 98 F (36.7 C) 98.9 F (37.2 C)  97.7 F (36.5 C)  TempSrc: Axillary Oral  Oral  SpO2: 96% 100%  95%  Weight:      Height:        Intake/Output Summary (Last 24 hours) at 06/19/2021 0832 Last data filed at 06/18/2021 1632 Gross per 24 hour  Intake 2645.03 ml  Output --  Net 2645.03 ml   Filed Weights   06/18/21 1032  Weight: 54.4 kg    Examination: General appearance: Thin elderly adult female, alert and in no acute distress.   HEENT: Anicteric, conjunctiva pink, lids and lashes normal. No nasal deformity, discharge, epistaxis.  Lips moist, partially edentulous, oropharynx tacky dry, no oral lesions, hearing seems diminished.   Skin: Warm and dry.   No suspicious rashes or lesions.  On face, neck, arms, upper chest, abdomen, or legs Cardiac: RRR, nl S1-S2, no murmurs appreciated.  Capillary refill is brisk.  No LE edema.  Radial  pulses 2+ and symmetric. Respiratory: Normal respiratory rate  and rhythm.  CTAB without rales or wheezes. Abdomen: Abdomen soft.  no TTP. No ascites, distension, hepatosplenomegaly.   MSK: No deformities or effusions. Neuro: Awake and alert.  EOMI, right arm contracture and weakness, left arm strength normal. Speech fluent.    Psych: Sensorium intact and responding to questions, attention seems diminished, affect pleasant, judgment insight appear severely impaired.  She is oriented to self only, nothing else.       Data Reviewed: I have personally reviewed following labs and imaging studies:  CBC: Recent Labs  Lab 06/18/21 1147 06/19/21 0327  WBC 15.7* 14.5*  NEUTROABS 13.8*  --   HGB 15.5* 12.1   HCT 49.3* 35.8*  MCV 92.1 89.7  PLT 309 [id number]   Basic Metabolic Panel: Recent Labs  Lab 06/18/21 1147 06/19/21 0327  NA 140 141  K 3.7 2.7*  CL 107 107  CO2 26 26  GLUCOSE 115* 104*  BUN 33* 30*  CREATININE 1.06* 0.78  CALCIUM 9.2 8.4*   GFR: Estimated Creatinine Clearance: 39.5 mL/min (by C-G formula based on SCr of 0.78 mg/dL). Liver Function Tests: Recent Labs  Lab 06/18/21 1147  AST 25  ALT 18  ALKPHOS 65  BILITOT 1.1  PROT 8.3*  ALBUMIN 4.1   No results for input(s): LIPASE, AMYLASE in the last 168 hours. No results for input(s): AMMONIA in the last 168 hours. Coagulation Profile: No results for input(s): INR, PROTIME in the last 168 hours. Cardiac Enzymes: No results for input(s): CKTOTAL, CKMB, CKMBINDEX, TROPONINI in the last 168 hours. BNP (last 3 results) No results for input(s): PROBNP in the last 8760 hours. HbA1C: Recent Labs    06/19/21 0327  HGBA1C 5.2   CBG: Recent Labs  Lab 06/18/21 1116 06/19/21 0719  GLUCAP 98 93   Lipid Profile: Recent Labs    06/19/21 0327  CHOL 166  HDL 42  LDLCALC 118*  TRIG 30  CHOLHDL 4.0   Thyroid Function Tests: No results for input(s): TSH, T4TOTAL, FREET4, T3FREE, THYROIDAB in the last 72 hours. Anemia Panel: No results for input(s): VITAMINB12, FOLATE, FERRITIN, TIBC, IRON, RETICCTPCT in the last 72 hours. Urine analysis:    Component Value Date/Time   COLORURINE AMBER (A) 06/18/2021 1107   APPEARANCEUR HAZY (A) 06/18/2021 1107   LABSPEC 1.027 06/18/2021 1107   PHURINE 5.0 06/18/2021 1107   GLUCOSEU NEGATIVE 06/18/2021 1107   HGBUR NEGATIVE 06/18/2021 1107   BILIRUBINUR NEGATIVE 06/18/2021 1107   KETONESUR 5 (A) 06/18/2021 1107   PROTEINUR NEGATIVE 06/18/2021 1107   UROBILINOGEN 0.2 07/13/2015 1024   NITRITE NEGATIVE 06/18/2021 1107   LEUKOCYTESUR NEGATIVE 06/18/2021 1107   Sepsis Labs: '@LABRCNTIP'$ (procalcitonin:4,lacticacidven:4)  ) Recent Results (from the past 240 hour(s))  Culture,  blood (single)     Status: None (Preliminary result)   Collection Time: 06/18/21  1:56 PM   Specimen: Right Antecubital; Blood  Result Value Ref Range Status   Specimen Description   Final    RIGHT ANTECUBITAL BOTTLES DRAWN AEROBIC AND ANAEROBIC   Special Requests   Final    Blood Culture adequate volume Performed at [facility], [address] [place], [place] [address]    Culture PENDING  Incomplete   Report Status PENDING  Incomplete  SARS CORONAVIRUS 2 (TAT 6-24 HRS) Nasopharyngeal Nasopharyngeal Swab     Status: None   Collection Time: 06/18/21  2:02 PM   Specimen: Nasopharyngeal Swab  Result Value Ref Range Status   SARS Coronavirus 2 NEGATIVE NEGATIVE Final    Comment: (  NOTE) SARS-CoV-2 target nucleic acids are NOT DETECTED.  The SARS-CoV-2 RNA is generally detectable in upper and lower respiratory specimens during the acute phase of infection. Negative results do not preclude SARS-CoV-2 infection, do not rule out co-infections with other pathogens, and should not be used as the sole basis for treatment or other patient management decisions. Negative results must be combined with clinical observations, patient history, and epidemiological information. The expected result is Negative.  Fact Sheet for Patients: SugarRoll.be  Fact Sheet for Healthcare Providers: https://www.woods-mathews.com/  This test is not yet approved or cleared by the [place] FDA and  has been authorized for detection and/or diagnosis of SARS-CoV-2 by FDA under an Emergency Use Authorization (EUA). This EUA will remain  in effect (meaning this test can be used) for the duration of the COVID-19 declaration under Se ction 564(b)(1) of the Act, 21 U.S.C. section 360bbb-3(b)(1), unless the authorization is terminated or revoked sooner.  Performed at [facility], [place] [address] [place], [place] [address]          Radiology Studies: CT  HEAD WO CONTRAST (5MM)  Result Date: 06/18/2021 CLINICAL DATA:  Altered mental status. EXAM: CT HEAD WITHOUT CONTRAST TECHNIQUE: Contiguous axial images were obtained from the base of the skull through the vertex without intravenous contrast. COMPARISON:  CT head dated 06/09/2020. FINDINGS: Brain: No evidence of acute infarction, hemorrhage, hydrocephalus, extra-axial collection or mass lesion/mass effect. There is moderate cerebral volume loss with associated ex vacuo dilatation. Periventricular white matter hypoattenuation likely represents chronic small vessel ischemic disease. Vascular: There are vascular calcifications in the carotid siphons. Skull: The patient is status post a right frontal craniotomy. Negative for fracture. Sinuses/Orbits: No acute finding. Other: None. IMPRESSION: No acute intracranial process. Electronically Signed   By: Zerita Boers M.D.   On: 06/18/2021 12:03   DG Chest Port 1 View  Result Date: 06/18/2021 CLINICAL DATA:  Altered mental status EXAM: PORTABLE CHEST 1 VIEW COMPARISON:  08/02/2016 FINDINGS: Lung volumes are small and there is mild left basilar atelectasis. Lungs are otherwise clear. No pneumothorax or pleural effusion. Cardiac size within normal limits. Osseous structures are age-appropriate. IMPRESSION: No active disease. Electronically Signed   By: Fidela Salisbury M.D.   On: 06/18/2021 12:57        Scheduled Meds:   stroke: mapping our early stages of recovery book   Does not apply Once   amLODipine  10 mg Oral Daily   docusate sodium  100 mg Oral TID   donepezil  10 mg Oral Daily   enoxaparin (LOVENOX) injection  30 mg Subcutaneous Q24H   insulin aspart  0-15 Units Subcutaneous TID WC   LORazepam  1 mg Intravenous Once   memantine  10 mg Oral BID   niacin  500 mg Oral Daily   oxybutynin  5 mg Oral BID   potassium chloride SA  40 mEq Oral BID   Continuous Infusions:  ceFEPime (MAXIPIME) IV     lactated ringers Stopped (06/18/21 2200)    vancomycin       LOS: 1 day    Time spent: 25 minutes    Edwin Dada, MD [facility] 06/19/2021, 8:32 AM     Please page though [place] or Epic secure chat:  For [employer], Adult nurse

## 2021-06-19 NOTE — Evaluation (Signed)
Speech Language Pathology Evaluation Patient Details Name: Taylor Andrews MRN: [id number] DOB: 04-02-1934 Today's Date: 06/19/2021 Time: [id number] SLP Time Calculation (min) (ACUTE ONLY): 17 min  Problem List:  Patient Active Problem List   Diagnosis Date Noted   Hypokalemia 07/14/2015   Encephalopathy acute    Aspiration pneumonia ([place]) 07/13/2015   Acute encephalopathy 07/13/2015   Sepsis ([place]) 07/13/2015   Dementia ([place]) 07/13/2015   HTN (hypertension) 07/13/2015   DM (diabetes mellitus), type 2 ([place]) 07/13/2015   Encephalopathy 07/13/2015   Subdural hematoma ([place]) 01/22/2014   Lower abdominal pain 12/30/2013   Hx of adenomatous colonic polyps 12/30/2013   Anemia 04/03/2013   IRON DEFICIENCY 06/02/2009   MILK PRODUCTS ALLERGY 06/02/2009   COLONIC POLYPS 05/26/2009   HEMORRHOIDS, INTERNAL 05/26/2009   ABDOMINAL PAIN 05/26/2009   Past Medical History:  Past Medical History:  Diagnosis Date   Adenomatous colon polyp    carcinoma in situ   Chronic abdominal pain    Dementia ([place])    Diabetes mellitus    Fatty liver    on [place] normal LFT'S   GERD (gastroesophageal reflux disease)    Hyperlipidemia    Hypertension    Iron deficiency    Chronic current parameter normal; 4/09 iron was 12 sat 5%   SDH (subdural hematoma) ([place]) 01/2014   chronic   Past Surgical History:  Past Surgical History:  Procedure Laterality Date   ABDOMINAL HYSTERECTOMY     ANKLE SURGERY     Left ;pins   COLONOSCOPY  8/05   internal hemorrhoids,polyp bengin   COLONOSCOPY  01/12/2011   [id number] rectum/ 5-mm polyp at the splenic flexure s/p polypectomy, tattooing, and scarred hepatic flexure, absolutely no evidence of any residual polyp tissue or neoplasm/Scattered left-sided diverticula. Tubular adenoma, no high-grade dysplasia.Repeat TCS due 01/2016   COLONOSCOPY  06/16/2009   [id number] hemorrhoids, otherwise normal rectum.Flat polypoid lesion at the hepatic flexure, debulked with piecemeal  snare polypectomy.  This area was also tattooed Transverse colon and descending colon  flat adenomatous appearing polyps resected with hot snare cautery as well. Tubulovillous adenomas   COLONOSCOPY  December 2010   Dr. Gala Romney: Flat sessile hepatic flexure polyp status post saline-assisted piecemeal polypectomy. Fragments of adenomatous polyp with high-grade dysplasia.   COLONOSCOPY  March 2011   Dr. Gala Romney: Minimal persisting polypoid tissue at site of previous piecemeal polypectomy status post hot snare polypectomy. Tubular adenoma, no high-grade dysplasia   CRANIOTOMY Right 01/23/2014   Procedure: CRANIOTOMY HEMATOMA EVACUATION SUBDURAL;  Surgeon: Eustace Moore, MD;  Location: [facility];  Service: Neurosurgery;  Laterality: Right;   ESOPHAGOGASTRODUODENOSCOPY  6/05   tiny antral erosin with some deformity of pyloric channel   VESICOVAGINAL FISTULA CLOSURE W/ TAH     HPI:  Taylor Andrews is a 87 y.o. female with a history of patient has altered mental status and is unable to provide history, history obtained via chart.  Has a history of diabetes, hypertension, dementia, GERD.  Patient was in her normal state last night, but was quite sleepy when she awoke.  She did have breakfast but became more sleepy and unresponsive again.  There have been no falls, complaints of headaches.  She has had similar episodes been these resolved on their own.  She does have a history of urinary tract infections, some of which have led to altered mental status similar to this but not as severe.  No new medications or medication dose adjustments. MRI was negative for acute changes.  SLE requested.   Assessment / Plan / Recommendation Clinical Impression  Pt with baseline dementia and no family present at this time. Sh was alert, responsive, but confused throughout the visit. Pt responded to most questions with unrelated responses. She could only follow one step commands with visual and gestural cues (open mouth, point to  ceiling, touch nose). Formal cognitive linguistic testing could not be completed due to severity of attention and memory deficits. MRI showed no acute changes, suspect that Pt is at her baseline in terms of cognitive linguistic skills. She will return to her assisted environment (family care center) and will have the level of assist for communication that she needs. No further SLP services indicated at this time.    SLP Assessment  SLP Recommendation/Assessment: Patient does not need any further Speech Lanaguage Pathology Services SLP Visit Diagnosis: Cognitive communication deficit (R41.841)    Follow Up Recommendations  24 hour supervision/assistance    Frequency and Duration           SLP Evaluation Cognition  Overall Cognitive Status: History of cognitive impairments - at baseline Arousal/Alertness: Awake/alert Orientation Level: Disoriented to place;Disoriented to situation;Disoriented to time Memory: Impaired Memory Impairment: Storage deficit;Retrieval deficit Awareness: Impaired Awareness Impairment: Intellectual impairment Problem Solving: Impaired Problem Solving Impairment: Verbal basic Executive Function: Decision Making Decision Making: Impaired Safety/Judgment: Impaired       Comprehension  Auditory Comprehension Overall Auditory Comprehension: Impaired Yes/No Questions: Impaired Commands: Impaired Conversation: Simple Interfering Components: Attention;Working memory EffectiveTechniques: Surveyor, minerals: Exceptions to [employer] Reading Comprehension Reading Status: Unable to assess (comment)    Expression Verbal Expression Overall Verbal Expression: Impaired Initiation: No impairment Automatic Speech: Name;Social Response Level of Generative/Spontaneous Verbalization: Phrase Repetition: Impaired Level of Impairment: Phrase level Naming: Impairment Interfering Components: Attention Effective Techniques:  Semantic cues;Sentence completion Non-Verbal Means of Communication: Not applicable Written Expression Dominant Hand: Right Written Expression: Not tested   Oral / Motor  Oral Motor/Sensory Function Overall Oral Motor/Sensory Function: Within functional limits Motor Speech Overall Motor Speech: Appears within functional limits for tasks assessed Respiration: Within functional limits Phonation: Normal Resonance: Within functional limits Articulation: Within functional limitis Intelligibility: Intelligible Motor Planning: Witnin functional limits Motor Speech Errors: Not applicable   Thank you,  Genene Churn, [place] 06/19/2021, 4:59 PM

## 2021-06-19 NOTE — Progress Notes (Signed)
Pt lost IV access during transporting from ED to 300. Multiple RNs have attempted to reestablished with no luck. Ultrasound was also used with no luck. MD made aware of situation.

## 2021-06-19 NOTE — Progress Notes (Signed)
Spike with Dr. Loleta Books, [place] re: Midline placement. Per MD Ok to place PIV if unable place Midline.

## 2021-06-19 NOTE — TOC Progression Note (Signed)
Transition of Care [facility]) - Progression Note    Patient Details  Name: Taylor Andrews MRN: [id number] Date of Birth: 09-11-1934  Transition of Care [facility]) CM/SW Contact  Salome Arnt, [place] Phone Number: 06/19/2021, 1:06 PM  Clinical Narrative:  Pt admitted from [facility]. LCSW spoke with administrator, Lennette Bihari regarding pt. He reports pt has been there several years and is okay to return. She requires assistance with bathing and feeding and ambulates independently at baseline. PT evaluated pt and recommend HHPT. LCSW discussed with pt's sister, Otilio Saber and Lennette Bihari. Referred and accepted by [facility] with Alvis Lemmings. TOC will continue to follow.       Barriers to Discharge: Continued Medical Work up  Expected Discharge Plan and Services                                                 Social Determinants of Health (SDOH) Interventions    Readmission Risk Interventions No flowsheet data found.

## 2021-06-19 NOTE — Progress Notes (Signed)
Pharmacy Antibiotic Note  Taylor Andrews is a 87 y.o. female admitted on 06/18/2021 with sepsis.  Pharmacy has been consulted for Cefepime and Vancomycin dosing. Renal function improved, adjust   Plan: Increase Cefepime 2gm IV q12hrs Increase Vancomycin 750 mg IV Q 24 hrs. Goal AUC 400-550. Expected AUC: 497, Css min predicted 12.4 SCr used: 0.78 F/U cxs and clinical progress Monitor V/S, labs and levels as indicated  Height: '5\' 1"'$  (154.9 cm) Weight: 54.4 kg (119 lb 14.9 oz) IBW/kg (Calculated) : 47.8  Temp (24hrs), Avg:98.4 F (36.9 C), Min:97.7 F (36.5 C), Max:98.9 F (37.2 C)  Recent Labs  Lab 06/18/21 1147 06/18/21 1357 06/19/21 0327  WBC 15.7*  --  14.5*  CREATININE 1.06*  --  0.78  LATICACIDVEN 3.6* 2.7*  --      Estimated Creatinine Clearance: 39.5 mL/min (by C-G formula based on SCr of 0.78 mg/dL).    Allergies  Allergen Reactions   Milk-Related Compounds Other (See Comments)    Lactose Intolerant    Iohexol Other (See Comments)     Desc: PT STATES THAT THE CONTRAST THAT SHE HAD IN 2004 MADE HER SICK, PT COULD NOT SPECIFY REACTION TYPES     Antimicrobials this admission: Vanc 8/14 >>  Cefepime 8/14 >>   Dose adjustments this admission:  Microbiology results:  8/14 BCx: pending  8/14UCx: pending   MRSA PCR: pending  Thank you for allowing pharmacy to be a part of this patient's care.  Isac Sarna, BS Vena [place], BCPS Clinical Pharmacist Pager [phone] Cristy Friedlander 06/19/2021 7:53 AM

## 2021-06-19 NOTE — Evaluation (Signed)
Occupational Therapy Evaluation Patient Details Name: Taylor Andrews MRN: [id number] DOB: 09-09-1934 Today's Date: 06/19/2021    History of Present Illness Taylor Andrews is a 87 y.o. F with DM, dementia, lives in SNF, HTN who presented with encephalopathy.   Clinical Impression   Patient in room upon therapy arrival and agreeable to participate in OT evaluation. Due to history of cognitive impairment, patient is unable to provide any information regarding baseline function or assist provided at [place]. Patient has difficulty following one step commands due to cognition although is very easily guided to complete basic ADL tasks. Based on her performance during evaluation, patient requires minimal assistance and guidance to participate and complete ADL tasks which imagine is her baseline due to her cognitive deficits. I do not foresee any need for additional OT services at this time when discharged. Patient may return to ALF and provided with assistance from staff for ADL completion.     Follow Up Recommendations  No OT follow up    Equipment Recommendations  None recommended by OT       Precautions / Restrictions Precautions Precautions: Fall Restrictions Weight Bearing Restrictions: No      Mobility Bed Mobility Overal bed mobility: Needs Assistance Bed Mobility: Supine to Sit     Supine to sit: Min assist;HOB elevated          Transfers Overall transfer level: Needs assistance Equipment used: Rolling walker (2 wheeled) Transfers: Sit to/from Stand Sit to Stand: Min assist              Balance Overall balance assessment: No apparent balance deficits (not formally assessed)             ADL either performed or assessed with clinical judgement   ADL Overall ADL's : Needs assistance/impaired (unknow if this is patient's baseline.)     Grooming: Wash/dry face;Sitting;Minimal assistance   Upper Body Bathing: Minimal assistance;Sitting   Lower Body Bathing:  Minimal assistance;Sit to/from stand   Upper Body Dressing : Minimal assistance;Sitting   Lower Body Dressing: Minimal assistance;Sit to/from stand   Toilet Transfer: Minimal assistance;BSC;RW                   Vision Baseline Vision/History:  (Unknown) Patient Visual Report: Other (comment) (unable to report anything pertaining to her vision.) Additional Comments: While ambulating in hallway with PT and using RW, she drifted into the wall on the right side frequently.            Pertinent Vitals/Pain Pain Assessment: Faces Faces Pain Scale: No hurt     Hand Dominance Right (per medical chart)   Extremity/Trunk Assessment Upper Extremity Assessment Upper Extremity Assessment: Overall WFL for tasks assessed   Lower Extremity Assessment Lower Extremity Assessment: Defer to PT evaluation       Communication Communication Communication: Expressive difficulties (She does mumble and is soft spoken when communicating.)   Cognition Arousal/Alertness: Awake/alert Behavior During Therapy: WFL for tasks assessed/performed Overall Cognitive Status: No family/caregiver present to determine baseline cognitive functioning                                                Home Living Family/patient expects to be discharged to:: Group home  Additional Comments: Pt unable to provide any background information. Unable to answer questions regarding baseline functional performance. No history provided in chart other than patient is from [place].      Prior Functioning/Environment          Comments: Prior level of function unknown at time of evaluation.        OT Problem List: Decreased strength      OT Treatment/Interventions:      OT Goals(Current goals can be found in the care plan section) Acute Rehab OT Goals Patient Stated Goal: None stated  OT Frequency:  Eval only            Co-evaluation PT/OT/SLP Co-Evaluation/Treatment: Yes (PT arrived in middle of OT evaluation) Reason for Co-Treatment: To address functional/ADL transfers   OT goals addressed during session: ADL's and self-care;Proper use of Adaptive equipment and DME;Strengthening/ROM      AM-PAC OT "6 Clicks" Daily Activity     Outcome Measure Help from another person eating meals?: A Little Help from another person taking care of personal grooming?: A Little Help from another person toileting, which includes using toliet, bedpan, or urinal?: A Little Help from another person bathing (including washing, rinsing, drying)?: A Little Help from another person to put on and taking off regular upper body clothing?: A Little Help from another person to put on and taking off regular lower body clothing?: A Little 6 Click Score: 18   End of Session Equipment Utilized During Treatment: Gait belt;Rolling walker  Activity Tolerance: Patient tolerated treatment well Patient left: in chair;with call bell/phone within reach;with chair alarm set  OT Visit Diagnosis: Muscle weakness (generalized) (M62.81)                Time: [id number] OT Time Calculation (min): 37 min Charges:  OT General Charges $OT Visit: 1 Visit OT Evaluation $OT Eval Low Complexity: 1 Low  Ailene Ravel, OTR/L,CBIS  [phone]   Keirstan Iannello, Clarene [place] 06/19/2021, 11:19 AM

## 2021-06-19 NOTE — Plan of Care (Signed)
  Problem: Acute Rehab PT Goals(only PT should resolve) Goal: Pt Will Go Supine/Side To Sit Outcome: Progressing Flowsheets (Taken 06/19/2021 1530) Pt will go Supine/Side to Sit:  with min guard assist  with minimal assist Goal: Patient Will Transfer Sit To/From Stand Outcome: Progressing Flowsheets (Taken 06/19/2021 1530) Patient will transfer sit to/from stand:  with min guard assist  with minimal assist Goal: Pt Will Transfer Bed To Chair/Chair To Bed Outcome: Progressing Flowsheets (Taken 06/19/2021 1530) Pt will Transfer Bed to Chair/Chair to Bed:  min guard assist  with supervision Goal: Pt Will Ambulate Outcome: Progressing Flowsheets (Taken 06/19/2021 1530) Pt will Ambulate:  100 feet  with min guard assist  with supervision  with rolling walker   3:31 PM, 06/19/21 Lonell Grandchild, MPT Physical Therapist with [facility] [phone] office [phone] mobile phone

## 2021-06-19 NOTE — Progress Notes (Signed)
Initial Nutrition Assessment  DOCUMENTATION CODES:      INTERVENTION:  Recommend ST evaluate for appropriate textures and liquid consistency  Ensure Enlive po BID, each supplement provides 350 kcal and 20 grams of protein   Assist with feeding all meals and ONS BID   Recommend obtain re-weight to verify result  NUTRITION DIAGNOSIS:   Inadequate oral intake related to acute illness (sepsis) as evidenced by per patient/family report.   GOAL:  Patient will meet greater than or equal to 90% of their needs  MONITOR:  PO intake, Weight trends, Skin, Labs, Supplement acceptance  REASON FOR ASSESSMENT:   Malnutrition Screening Tool    ASSESSMENT: Patient is a 87 yo female from [facility]. Hx of DM, GERD, dementia, HTN, HLD and presents with altered mental status, sepsis.   Neurology consulted. CT Head-no acute findings.  Patient eating poorly. Her niece is bedside. Patient has been pocketing foods lately and requires assistance with feeding. Patient unable to provide nutrition history.   Weight history: 06/18/21- 54.4 kg 06/08/17- 54.4 kg 08/02/16- 49.9 kg  Medications reviewed and include: Colace, Aricept, Insulin, Namenda, KCL and oxybutynin.   Labs: hypokalemia BMP Latest Ref Rng & Units 06/19/2021 06/18/2021 05/24/2018  Glucose 70 - 99 mg/dL 104(H) 115(H) 137(H)  BUN 8 - 23 mg/dL 30(H) 33(H) 13  Creatinine 0.44 - 1.00 mg/dL 0.78 1.06(H) 1.13(H)  Sodium 135 - 145 mmol/L 141 140 140  Potassium 3.5 - 5.1 mmol/L 2.7(LL) 3.7 3.4(L)  Chloride 98 - 111 mmol/L 107 107 106  CO2 22 - 32 mmol/L '26 26 26  '$ Calcium 8.9 - 10.3 mg/dL 8.4(L) 9.2 9.4      NUTRITION - FOCUSED PHYSICAL EXAM: Unable to complete Nutrition-Focused physical exam at this time. Nursing providing care and administering medications during RD visit.   Diet Order:   Diet Order             Diet regular Room service appropriate? Yes; Fluid consistency: Thin  Diet effective now                    EDUCATION NEEDS:  Not appropriate for education at this time  Skin:  Skin Assessment: Reviewed RN Assessment  Last BM:  8/15 type 7 x 2 today  Height:   Ht Readings from Last 1 Encounters:  06/18/21 '5\' 1"'$  (1.549 m)    Weight:   Wt Readings from Last 1 Encounters:  06/18/21 54.4 kg    Ideal Body Weight:   48 kg  BMI:  Body mass index is 22.66 kg/m.  Estimated Nutritional Needs:   Kcal:  1650-1800  Protein:  70-77 gr  Fluid:  1.6-1.8 liters daily   Colman Cater MS,RD,CSG,LDN Contact: Shea Evans

## 2021-06-19 NOTE — Evaluation (Signed)
Physical Therapy Evaluation Patient Details Name: Taylor Andrews MRN: [id number] DOB: 1934-08-17 Today's Date: 06/19/2021   History of Present Illness  Taylor Andrews is a 87 y.o. female with a history of patient has altered mental status and is unable to provide history, history obtained via chart.  Has a history of diabetes, hypertension, dementia, GERD.  Patient was in her normal state last night, but was quite sleepy when she awoke.  She did have breakfast but became more sleepy and unresponsive again.  There have been no falls, complaints of headaches.  She has had similar episodes been these resolved on their own.  She does have a history of urinary tract infections, some of which have led to altered mental status similar to this but not as severe.  No new medications or medication dose adjustments.   Clinical Impression  Patient requires repeated verbal/tactile cueing to follow directions, once standing required repeated instructions to hold onto RW to avoid losing balance, demonstrates slow labored cadence with frequent veering to the right occasionally bumping into walls requiring verbal/tactile cueing for safety and limited for ambulation mostly due to fatigue and incontinent of stool.  Patient tolerated sitting up in chair after therapy - nursing staff notified.  Patient will benefit from continued physical therapy in hospital and recommended venue below to increase strength, balance, endurance for safe ADLs and gait.      Follow Up Recommendations Home health PT;Supervision for mobility/OOB;Supervision - Intermittent    Equipment Recommendations  None recommended by PT    Recommendations for Other Services       Precautions / Restrictions Precautions Precautions: Fall Restrictions Weight Bearing Restrictions: No      Mobility  Bed Mobility               General bed mobility comments: as per OT notes    Transfers Overall transfer level: Needs assistance Equipment  used: Rolling walker (2 wheeled) Transfers: Sit to/from Stand Sit to Stand: Min assist         General transfer comment: increased time, labored movement, required repeated verbal/tactile cueing to follow instructions  Ambulation/Gait Ambulation/Gait assistance: Min assist;Min guard Gait Distance (Feet): 65 Feet Assistive device: Rolling walker (2 wheeled) Gait Pattern/deviations: Decreased step length - right;Decreased step length - left;Decreased stride length;Drifts right/left Gait velocity: decreased   General Gait Details: slow labored cadence with frequent veering/drifting to the right, no loss of balance, limited mostly due to fatigue and incontinent of stool  Stairs            Wheelchair Mobility    Modified Rankin (Stroke Patients Only)       Balance Overall balance assessment: Needs assistance Sitting-balance support: Feet supported;No upper extremity supported Sitting balance-Leahy Scale: Fair Sitting balance - Comments: fair/good seated at EOB   Standing balance support: During functional activity;Bilateral upper extremity supported Standing balance-Leahy Scale: Fair Standing balance comment: using RW                             Pertinent Vitals/Pain Pain Assessment: Faces Faces Pain Scale: No hurt    Home Living Family/patient expects to be discharged to:: Assisted living               Home Equipment: Walker - 2 wheels Additional Comments: Patient is poor historian    Prior Function Level of Independence: Needs assistance   Gait / Transfers Assistance Needed: assisted household ambulation using R  ADL's /  Homemaking Assistance Needed: assisted by ALF staff        Hand Dominance   Dominant Hand: Right    Extremity/Trunk Assessment   Upper Extremity Assessment Upper Extremity Assessment: Defer to OT evaluation    Lower Extremity Assessment Lower Extremity Assessment: Generalized weakness    Cervical / Trunk  Assessment Cervical / Trunk Assessment: Normal  Communication   Communication: Expressive difficulties  Cognition Arousal/Alertness: Awake/alert Behavior During Therapy: WFL for tasks assessed/performed Overall Cognitive Status: History of cognitive impairments - at baseline                                        General Comments      Exercises     Assessment/Plan    PT Assessment Patient needs continued PT services  PT Problem List Decreased strength;Decreased activity tolerance;Decreased balance;Decreased mobility       PT Treatment Interventions DME instruction;Gait training;Stair training;Functional mobility training;Patient/family education;Therapeutic activities;Therapeutic exercise;Balance training    PT Goals (Current goals can be found in the Care Plan section)  Acute Rehab PT Goals Patient Stated Goal: return home PT Goal Formulation: With patient Time For Goal Achievement: 06/23/21 Potential to Achieve Goals: Good    Frequency Min 3X/week   Barriers to discharge        Co-evaluation PT/OT/SLP Co-Evaluation/Treatment: Yes Reason for Co-Treatment: To address functional/ADL transfers PT goals addressed during session: Mobility/safety with mobility;Proper use of DME;Balance         AM-PAC PT "6 Clicks" Mobility  Outcome Measure Help needed turning from your back to your side while in a flat bed without using bedrails?: A Little Help needed moving from lying on your back to sitting on the side of a flat bed without using bedrails?: A Little Help needed moving to and from a bed to a chair (including a wheelchair)?: A Little Help needed standing up from a chair using your arms (e.g., wheelchair or bedside chair)?: A Little Help needed to walk in hospital room?: A Little Help needed climbing 3-5 steps with a railing? : A Lot 6 Click Score: 17    End of Session   Activity Tolerance: Patient tolerated treatment well;Patient limited by  fatigue Patient left: in chair;with call bell/phone within reach Nurse Communication: Mobility status PT Visit Diagnosis: Unsteadiness on feet (R26.81);Other abnormalities of gait and mobility (R26.89);Muscle weakness (generalized) (M62.81)    Time: [id number] PT Time Calculation (min) (ACUTE ONLY): 31 min   Charges:   PT Evaluation $PT Eval Moderate Complexity: 1 Mod PT Treatments $Therapeutic Activity: 23-37 mins        3:28 PM, 06/19/21 Lonell Grandchild, MPT Physical Therapist with [facility] [phone] office [phone] mobile phone

## 2021-06-20 LAB — CBC
HCT: 39.4 % (ref 36.0–46.0)
Hemoglobin: 12.4 g/dL (ref 12.0–15.0)
MCH: 28.7 pg (ref 26.0–34.0)
MCHC: 31.5 g/dL (ref 30.0–36.0)
MCV: 91.2 fL (ref 80.0–100.0)
Platelets: 208 10*3/uL (ref 150–400)
RBC: 4.32 MIL/uL (ref 3.87–5.11)
RDW: 13.5 % (ref 11.5–15.5)
WBC: 10.7 10*3/uL — ABNORMAL HIGH (ref 4.0–10.5)
nRBC: 0 % (ref 0.0–0.2)

## 2021-06-20 LAB — URINE CULTURE: Culture: NO GROWTH

## 2021-06-20 LAB — BASIC METABOLIC PANEL
Anion gap: 9 (ref 5–15)
BUN: 15 mg/dL (ref 8–23)
CO2: 28 mmol/L (ref 22–32)
Calcium: 8.6 mg/dL — ABNORMAL LOW (ref 8.9–10.3)
Chloride: 103 mmol/L (ref 98–111)
Creatinine, Ser: 0.64 mg/dL (ref 0.44–1.00)
GFR, Estimated: >60 mL/min (ref >60)
Glucose, Bld: 88 mg/dL (ref 70–99)
Potassium: 3.2 mmol/L — ABNORMAL LOW (ref 3.5–5.1)
Sodium: 140 mmol/L (ref 135–145)

## 2021-06-20 LAB — GLUCOSE, CAPILLARY
Glucose-Capillary: 74 mg/dL (ref 70–99)
Glucose-Capillary: 76 mg/dL (ref 70–99)

## 2021-06-20 LAB — PROCALCITONIN: Procalcitonin: 1.5 ng/mL

## 2021-06-20 MED ORDER — CEPHALEXIN 500 MG PO CAPS: 500.0000 mg | ORAL_CAPSULE | Freq: Four times a day (QID) | ORAL | 0 refills | Status: AC

## 2021-06-20 MED ORDER — CEPHALEXIN 500 MG PO CAPS: 500.0000 mg | ORAL_CAPSULE | Freq: Four times a day (QID) | ORAL | 0 refills | Status: DC

## 2021-06-20 NOTE — Discharge Summary (Signed)
Physician Discharge Summary  Taylor Andrews [id number] DOB: [birth year] DOA: 06/18/2021  PCP: Redmond School, MD  Admit date: 06/18/2021  Discharge date: 06/20/2021  Admitted From:[name]  Disposition:  Same  Recommendations for Outpatient Follow-up:  Follow up with PCP in 1-2 weeks Please obtain BMP/CBC in one week Continue on Keflex for 5 more days as prescribed for total 7-day course of treatment Continue on home medications as prior  Home Health: Yes with PT  Equipment/Devices: None  Discharge Condition:Stable  CODE STATUS: Full  Diet recommendation: Heart Healthy/carb modified  Brief/Interim Summary: Per HPI: Taylor Andrews is a 87 y.o. female with a history of patient has altered mental status and is unable to provide history, history obtained via chart.  Has a history of diabetes, hypertension, dementia, GERD.  Patient was in her normal state last night, but was quite sleepy when she awoke.  She did have breakfast but became more sleepy and unresponsive again.  There have been no falls, complaints of headaches.  She has had similar episodes been these resolved on their own.  She does have a history of urinary tract infections, some of which have led to altered mental status similar to this but not as severe.  No new medications or medication dose adjustments.  -Patient was admitted with concern for acute metabolic encephalopathy likely secondary to dehydration versus possible sepsis on admission.  No obvious source of infection was found, however her procalcitonin was elevated and she had remained on vancomycin and cefepime during this admission.  She had received IV fluids and her overall mentation had returned to baseline which is normally with pleasant confusion on account of her dementia.  This was confirmed with her niece on the phone.  Patient overall appears stable for discharge to continue on oral antibiotics for several more days to finish course of  treatment.  Brain MRI with no acute findings noted.  No other acute concerns noted during the course of this admission.  Discharge Diagnoses:  Active Problems:   Acute encephalopathy   Sepsis ([place])   Dementia ([facility])   HTN (hypertension)   DM (diabetes mellitus), type 2 ([place])  Principal discharge diagnosis: Acute metabolic encephalopathy-multifactorial secondary to dehydration as well as possible sepsis present on admission.  Discharge Instructions  Discharge Instructions     Diet - low sodium heart healthy   Complete by: As directed    Increase activity slowly   Complete by: As directed       Allergies as of 06/20/2021       Reactions   Milk-related Compounds Other (See Comments)   Lactose Intolerant    Iohexol Other (See Comments)    Desc: PT [place] THAT SHE HAD IN 2004 MADE HER SICK, PT COULD NOT SPECIFY REACTION TYPES        Medication List     STOP taking these medications    ondansetron 4 MG tablet Commonly known as: ZOFRAN       TAKE these medications    amLODipine 10 MG tablet Commonly known as: NORVASC Take 10 mg by mouth daily.   cephALEXin 500 MG capsule Commonly known as: KEFLEX Take 1 capsule (500 mg total) by mouth 4 (four) times daily for 5 days.   docusate sodium 100 MG capsule Commonly known as: COLACE Take 100 mg by mouth 3 (three) times daily.   donepezil 10 MG tablet Commonly known as: ARICEPT Take 10 mg by mouth daily.   Fish Oil  1000 MG Caps Take 1 capsule by mouth 2 (two) times daily with a meal.   hydrochlorothiazide 25 MG tablet Commonly known as: HYDRODIURIL Take 25 mg by mouth daily.   LORazepam 0.5 MG tablet Commonly known as: ATIVAN Take 0.5 mg by mouth at bedtime.   memantine 10 MG tablet Commonly known as: NAMENDA Take 10 mg by mouth 2 (two) times daily.   niacin 500 MG CR tablet Commonly known as: NIASPAN Take 500 mg by mouth daily.   oxybutynin 5 MG tablet Commonly known as:  DITROPAN Take 5 mg by mouth 2 (two) times daily.   potassium chloride SA 20 MEQ tablet Commonly known as: KLOR-CON Take 20 mEq by mouth daily.        Follow-up Information     [facility] Follow up.   Specialty: Home Health Services Why: For home health PT. Contact information: [place] [address] [phone]                Allergies  Allergen Reactions   Milk-Related Compounds Other (See Comments)    Lactose Intolerant    Iohexol Other (See Comments)     Desc: PT STATES THAT THE CONTRAST THAT SHE HAD IN 2004 MADE HER SICK, PT COULD NOT SPECIFY REACTION TYPES     Consultations: None   Procedures/Studies: CT HEAD WO CONTRAST (5MM)  Result Date: 06/18/2021 CLINICAL DATA:  Altered mental status. EXAM: CT HEAD WITHOUT CONTRAST TECHNIQUE: Contiguous axial images were obtained from the base of the skull through the vertex without intravenous contrast. COMPARISON:  CT head dated 06/09/2020. FINDINGS: Brain: No evidence of acute infarction, hemorrhage, hydrocephalus, extra-axial collection or mass lesion/mass effect. There is moderate cerebral volume loss with associated ex vacuo dilatation. Periventricular white matter hypoattenuation likely represents chronic small vessel ischemic disease. Vascular: There are vascular calcifications in the carotid siphons. Skull: The patient is status post a right frontal craniotomy. Negative for fracture. Sinuses/Orbits: No acute finding. Other: None. IMPRESSION: No acute intracranial process. Electronically Signed   By: Zerita Boers M.D.   On: 06/18/2021 12:03   MR BRAIN WO CONTRAST  Result Date: 06/19/2021 CLINICAL DATA:  Neuro deficit, acute, stroke suspected. Additional history provided: Confusion and weakness for 2 days. EXAM: MRI HEAD WITHOUT CONTRAST TECHNIQUE: Multiplanar, multiecho pulse sequences of the brain and surrounding structures were obtained without intravenous contrast.  COMPARISON:  Prior head CT examinations 06/18/2021 and earlier. Brain MRI 12/11/2010. FINDINGS: Brain: Moderate generalized cerebral atrophy. No cortical encephalomalacia is identified. No significant cerebral white matter disease. Thin extra-axial T2/FLAIR hyperintense signal abnormality overlying the posterior right cerebral hemisphere, measuring up to 1-2 mm in thickness (for instance as seen on series 12, image 19). This may reflect chronic dural thickening from the known prior right subdural hematoma. However, a trace residual/recurrent subdural hematoma cannot be excluded (difficult to age as it is too thin to appreciate on the head CT of 06/18/2021). There is no acute infarct. No evidence of an intracranial mass. No midline shift. Vascular: Maintained flow voids within the proximal large arterial vessels. Skull and upper cervical spine: No focal suspicious marrow lesion. Right frontoparietal cranioplasty. Sinuses/Orbits: Visualized orbits show no acute finding. No significant paranasal sinus disease at the imaged levels. Impression #2 will be called to the ordering clinician or representative by the Radiologist Assistant, and communication documented in the PACS or [employer]. IMPRESSION: No evidence of acute infarction. Thin extra-axial T2/FLAIR hyperintense signal abnormality overlying the posterior right cerebral  hemisphere, measuring up to 1-2 mm in thickness. This may reflect chronic dural thickening from the known prior right subdural hematoma. A trace residual/recurrent subdural hematoma cannot be excluded (difficult to age as it is too thin to appreciate on the head CT of 06/18/2021). Moderate generalized cerebral atrophy. Electronically Signed   By: Kellie Simmering D.O.   On: 06/19/2021 12:47   DG Chest Port 1 View  Result Date: 06/18/2021 CLINICAL DATA:  Altered mental status EXAM: PORTABLE CHEST 1 VIEW COMPARISON:  08/02/2016 FINDINGS: Lung volumes are small and there is mild left basilar  atelectasis. Lungs are otherwise clear. No pneumothorax or pleural effusion. Cardiac size within normal limits. Osseous structures are age-appropriate. IMPRESSION: No active disease. Electronically Signed   By: Fidela Salisbury M.D.   On: 06/18/2021 12:57     Discharge Exam: Vitals:   06/19/21 2032 06/20/21 0428  BP: (!) 124/45 107/85  Pulse: 60 66  Resp: 14 14  Temp: 98.7 F (37.1 C) 97.8 F (36.6 C)  SpO2: 100% 100%   Vitals:   06/19/21 0420 06/19/21 1218 06/19/21 2032 06/20/21 0428  BP: (!) 143/47 (!) 138/45 (!) 124/45 107/85  Pulse: 71  60 66  Resp: '16 17 14 14  '$ Temp: 97.7 F (36.5 C) 98 F (36.7 C) 98.7 F (37.1 C) 97.8 F (36.6 C)  TempSrc: Oral Axillary    SpO2: 95% 97% 100% 100%  Weight:      Height:        General: Pt is alert, awake, not in acute distress, pleasantly confused Cardiovascular: RRR, S1/S2 +, no rubs, no gallops Respiratory: CTA bilaterally, no wheezing, no rhonchi Abdominal: Soft, NT, ND, bowel sounds + Extremities: no edema, no cyanosis    The results of significant diagnostics from this hospitalization (including imaging, microbiology, ancillary and laboratory) are listed below for reference.     Microbiology: Recent Results (from the past 240 hour(s))  Urine Culture     Status: None   Collection Time: 06/18/21 11:07 AM   Specimen: Urine, Catheterized  Result Value Ref Range Status   Specimen Description   Final    URINE, CATHETERIZED Performed at [facility], [address], [place], [place] [address]    Special Requests   Final    NONE Performed at [facility], [address], [place], [place] [address]    Culture   Final    NO GROWTH Performed at [facility], [place] [address], [place], [place] [address]    Report Status 06/20/2021 FINAL  Final  Culture, blood (single)     Status: None (Preliminary result)   Collection Time: 06/18/21  1:56 PM   Specimen: Right Antecubital; Blood  Result Value Ref Range Status   Specimen  Description   Final    RIGHT ANTECUBITAL BOTTLES DRAWN AEROBIC AND ANAEROBIC   Special Requests Blood Culture adequate volume  Final   Culture   Final    NO GROWTH 2 DAYS Performed at [facility], [address], [place], [place] [address]    Report Status PENDING  Incomplete  SARS CORONAVIRUS 2 (TAT 6-24 HRS) Nasopharyngeal Nasopharyngeal Swab     Status: None   Collection Time: 06/18/21  2:02 PM   Specimen: Nasopharyngeal Swab  Result Value Ref Range Status   SARS Coronavirus 2 NEGATIVE NEGATIVE Final    Comment: (NOTE) SARS-CoV-2 target nucleic acids are NOT DETECTED.  The SARS-CoV-2 RNA is generally detectable in upper and lower respiratory specimens during the acute phase of  infection. Negative results do not preclude SARS-CoV-2 infection, do not rule out co-infections with other pathogens, and should not be used as the sole basis for treatment or other patient management decisions. Negative results must be combined with clinical observations, patient history, and epidemiological information. The expected result is Negative.  Fact Sheet for Patients: SugarRoll.be  Fact Sheet for Healthcare Providers: https://www.woods-mathews.com/  This test is not yet approved or cleared by the [place] FDA and  has been authorized for detection and/or diagnosis of SARS-CoV-2 by FDA under an Emergency Use Authorization (EUA). This EUA will remain  in effect (meaning this test can be used) for the duration of the COVID-19 declaration under Se ction 564(b)(1) of the Act, 21 U.S.C. section 360bbb-3(b)(1), unless the authorization is terminated or revoked sooner.  Performed at [facility], [place] [address] [place], [place] [address]      Labs: BNP (last 3 results) No results for input(s): BNP in the last 8760 hours. Basic Metabolic Panel: Recent Labs  Lab 06/18/21 1147 06/19/21 0327 06/20/21 0638  NA 140 141 140  K 3.7 2.7*  3.2*  CL 107 107 103  CO2 '26 26 28  '$ GLUCOSE 115* 104* 88  BUN 33* 30* 15  CREATININE 1.06* 0.78 0.64  CALCIUM 9.2 8.4* 8.6*   Liver Function Tests: Recent Labs  Lab 06/18/21 1147  AST 25  ALT 18  ALKPHOS 65  BILITOT 1.1  PROT 8.3*  ALBUMIN 4.1   No results for input(s): LIPASE, AMYLASE in the last 168 hours. No results for input(s): AMMONIA in the last 168 hours. CBC: Recent Labs  Lab 06/18/21 1147 06/19/21 0327 06/20/21 0638  WBC 15.7* 14.5* 10.7*  NEUTROABS 13.8*  --   --   HGB 15.5* 12.1 12.4  HCT 49.3* 35.8* 39.4  MCV 92.1 89.7 91.2  PLT 309 247 208   Cardiac Enzymes: No results for input(s): CKTOTAL, CKMB, CKMBINDEX, TROPONINI in the last 168 hours. BNP: Invalid input(s): POCBNP CBG: Recent Labs  Lab 06/19/21 1121 06/19/21 1610 06/19/21 2112 06/20/21 0735 06/20/21 1101  GLUCAP 78 111* 83 74 76   D-Dimer No results for input(s): DDIMER in the last 72 hours. Hgb A1c Recent Labs    06/19/21 0327  HGBA1C 5.2   Lipid Profile Recent Labs    06/19/21 0327  CHOL 166  HDL 42  LDLCALC 118*  TRIG 30  CHOLHDL 4.0   Thyroid function studies No results for input(s): TSH, T4TOTAL, T3FREE, THYROIDAB in the last 72 hours.  Invalid input(s): FREET3 Anemia work up No results for input(s): VITAMINB12, FOLATE, FERRITIN, TIBC, IRON, RETICCTPCT in the last 72 hours. Urinalysis    Component Value Date/Time   COLORURINE AMBER (A) 06/18/2021 1107   APPEARANCEUR HAZY (A) 06/18/2021 1107   LABSPEC 1.027 06/18/2021 1107   PHURINE 5.0 06/18/2021 1107   GLUCOSEU NEGATIVE 06/18/2021 1107   HGBUR NEGATIVE 06/18/2021 1107   BILIRUBINUR NEGATIVE 06/18/2021 1107   KETONESUR 5 (A) 06/18/2021 1107   PROTEINUR NEGATIVE 06/18/2021 1107   UROBILINOGEN 0.2 07/13/2015 1024   NITRITE NEGATIVE 06/18/2021 1107   LEUKOCYTESUR NEGATIVE 06/18/2021 1107   Sepsis Labs Invalid input(s): PROCALCITONIN,  WBC,  LACTICIDVEN Microbiology Recent Results (from the past 240  hour(s))  Urine Culture     Status: None   Collection Time: 06/18/21 11:07 AM   Specimen: Urine, Catheterized  Result Value Ref Range Status   Specimen Description   Final    URINE, CATHETERIZED Performed at [facility],  [address] [place], [place] [address]    Special Requests   Final    NONE Performed at [facility], [address] [place], [place] [address]    Culture   Final    NO GROWTH Performed at [facility], [place] [address], [place], [place] [address]    Report Status 06/20/2021 FINAL  Final  Culture, blood (single)     Status: None (Preliminary result)   Collection Time: 06/18/21  1:56 PM   Specimen: Right Antecubital; Blood  Result Value Ref Range Status   Specimen Description   Final    RIGHT ANTECUBITAL BOTTLES DRAWN AEROBIC AND ANAEROBIC   Special Requests Blood Culture adequate volume  Final   Culture   Final    NO GROWTH 2 DAYS Performed at [facility], [address], [place], [place] [address]    Report Status PENDING  Incomplete  SARS CORONAVIRUS 2 (TAT 6-24 HRS) Nasopharyngeal Nasopharyngeal Swab     Status: None   Collection Time: 06/18/21  2:02 PM   Specimen: Nasopharyngeal Swab  Result Value Ref Range Status   SARS Coronavirus 2 NEGATIVE NEGATIVE Final    Comment: (NOTE) SARS-CoV-2 target nucleic acids are NOT DETECTED.  The SARS-CoV-2 RNA is generally detectable in upper and lower respiratory specimens during the acute phase of infection. Negative results do not preclude SARS-CoV-2 infection, do not rule out co-infections with other pathogens, and should not be used as the sole basis for treatment or other patient management decisions. Negative results must be combined with clinical observations, patient history, and epidemiological information. The expected result is Negative.  Fact Sheet for Patients: SugarRoll.be  Fact Sheet for Healthcare Providers: https://www.woods-mathews.com/  This  test is not yet approved or cleared by the [place] FDA and  has been authorized for detection and/or diagnosis of SARS-CoV-2 by FDA under an Emergency Use Authorization (EUA). This EUA will remain  in effect (meaning this test can be used) for the duration of the COVID-19 declaration under Se ction 564(b)(1) of the Act, 21 U.S.C. section 360bbb-3(b)(1), unless the authorization is terminated or revoked sooner.  Performed at [facility], [place] [address], [place], [place] [address]      Time coordinating discharge: 35 minutes  SIGNED:   Rodena Goldmann, DO [facility] 06/20/2021, 11:08 AM  If 7PM-7AM, please contact night-coverage [email or web address]

## 2021-06-20 NOTE — NC FL2 (Signed)
[place] MEDICAID FL2 LEVEL OF CARE SCREENING TOOL     IDENTIFICATION  Patient Name: Taylor Andrews Birthdate: 25-Oct-1934 Sex: female Admission Date (Current Location): 06/18/2021  [facility] and [place] Number:  [employer] and Address:  [facility] [address], [place]      Provider Number: [id number]  Attending Physician Name and Address:  Rodena Goldmann, DO  Relative Name and Phone Number:       Current Level of Care: Hospital Recommended Level of Care: [facility] Prior Approval Number:    Date Approved/Denied:   PASRR Number:    Discharge Plan: Domiciliary (Rest home)    Current Diagnoses: Patient Active Problem List   Diagnosis Date Noted   Hypokalemia 07/14/2015   Encephalopathy acute    Aspiration pneumonia ([place]) 07/13/2015   Acute encephalopathy 07/13/2015   Sepsis ([place]) 07/13/2015   Dementia ([place]) 07/13/2015   HTN (hypertension) 07/13/2015   DM (diabetes mellitus), type 2 ([place]) 07/13/2015   Encephalopathy 07/13/2015   Subdural hematoma ([place]) 01/22/2014   Lower abdominal pain 12/30/2013   Hx of adenomatous colonic polyps 12/30/2013   Anemia 04/03/2013   IRON DEFICIENCY 06/02/2009   MILK PRODUCTS ALLERGY 06/02/2009   COLONIC POLYPS 05/26/2009   HEMORRHOIDS, INTERNAL 05/26/2009   ABDOMINAL PAIN 05/26/2009    Orientation RESPIRATION BLADDER Height & Weight     Self  Normal External catheter Weight: 119 lb 14.9 oz (54.4 kg) Height:  '5\' 1"'$  (154.9 cm)  BEHAVIORAL SYMPTOMS/MOOD NEUROLOGICAL BOWEL NUTRITION STATUS      Incontinent Diet (heart health, carb modified)  AMBULATORY STATUS COMMUNICATION OF NEEDS Skin   Limited Assist   Normal                       Personal Care Assistance Level of Assistance  Bathing, Feeding, Dressing Bathing Assistance: Limited assistance Feeding assistance: Limited assistance Dressing Assistance: Limited assistance     Functional Limitations Info  Sight, Speech,  Hearing Sight Info: Adequate Hearing Info: Adequate Speech Info: Adequate    SPECIAL CARE FACTORS FREQUENCY  PT (By licensed PT)     PT Frequency: home health PT              Contractures      Additional Factors Info  Code Status, Allergies, Psychotropic Code Status Info: Full code Allergies Info: Milk related compounds, Iohexol Psychotropic Info: Ativan         Current Medications (06/20/2021):  This is the current hospital active medication list Current Facility-Administered Medications  Medication Dose Route Frequency Provider Last Rate Last Admin    stroke: mapping our early stages of recovery book   Does not apply Once Truett Mainland, DO       acetaminophen (TYLENOL) tablet 650 mg  650 mg Oral Q6H PRN Truett Mainland, DO       Or   acetaminophen (TYLENOL) suppository 650 mg  650 mg Rectal Q6H PRN Truett Mainland, DO       amLODipine (NORVASC) tablet 10 mg  10 mg Oral Daily Truett Mainland, DO   10 mg at 06/20/21 0816   ceFEPIme (MAXIPIME) 2 g in sodium chloride 0.9 % 100 mL IVPB  2 g Intravenous Q12H Edwin Dada, MD 200 mL/hr at 06/20/21 1049 2 g at 06/20/21 1049   docusate sodium (COLACE) capsule 100 mg  100 mg Oral TID Truett Mainland, DO       donepezil (  ARICEPT) tablet 10 mg  10 mg Oral Daily Truett Mainland, DO   10 mg at 06/20/21 0816   enoxaparin (LOVENOX) injection 40 mg  40 mg Subcutaneous Q24H Edwin Dada, MD   40 mg at 06/20/21 [id number]   insulin aspart (novoLOG) injection 0-15 Units  0-15 Units Subcutaneous TID WC Truett Mainland, DO       LORazepam (ATIVAN) injection 1 mg  1 mg Intravenous Once Stinson, Jacob J, DO       memantine [facility]) tablet 10 mg  10 mg Oral BID Truett Mainland, DO   10 mg at 06/20/21 [id number]   niacin (NIASPAN) CR tablet 500 mg  500 mg Oral Daily Truett Mainland, DO   500 mg at 06/20/21 0816   ondansetron (ZOFRAN) tablet 4 mg  4 mg Oral Q6H PRN Truett Mainland, DO       Or   ondansetron [facility]) injection  4 mg  4 mg Intravenous Q6H PRN Truett Mainland, DO       oxybutynin (DITROPAN) tablet 5 mg  5 mg Oral BID Truett Mainland, DO   5 mg at 06/20/21 0816   potassium chloride SA (KLOR-CON) CR tablet 40 mEq  40 mEq Oral BID Edwin Dada, MD   40 mEq at 06/20/21 0816   vancomycin (VANCOREADY) IVPB 750 mg/150 mL  750 mg Intravenous Q24H Edwin Dada, MD 150 mL/hr at 06/19/21 1900 Restarted at 06/19/21 1900     Discharge Medications:  STOP taking these medications     ondansetron 4 MG tablet Commonly known as: ZOFRAN           TAKE these medications     amLODipine 10 MG tablet Commonly known as: NORVASC Take 10 mg by mouth daily.    cephALEXin 500 MG capsule Commonly known as: KEFLEX Take 1 capsule (500 mg total) by mouth 4 (four) times daily for 5 days.    docusate sodium 100 MG capsule Commonly known as: COLACE Take 100 mg by mouth 3 (three) times daily.    donepezil 10 MG tablet Commonly known as: ARICEPT Take 10 mg by mouth daily.    Fish Oil 1000 MG Caps Take 1 capsule by mouth 2 (two) times daily with a meal.    hydrochlorothiazide 25 MG tablet Commonly known as: HYDRODIURIL Take 25 mg by mouth daily.    LORazepam 0.5 MG tablet Commonly known as: ATIVAN Take 0.5 mg by mouth at bedtime.    memantine 10 MG tablet Commonly known as: NAMENDA Take 10 mg by mouth 2 (two) times daily.    niacin 500 MG CR tablet Commonly known as: NIASPAN Take 500 mg by mouth daily.    oxybutynin 5 MG tablet Commonly known as: DITROPAN Take 5 mg by mouth 2 (two) times daily.    potassium chloride SA 20 MEQ tablet Commonly known as: KLOR-CON Take 20 mEq by mouth daily.             Follow-up Information       Care, [facility] Follow up.   Specialty: Home Health Services Why: For home health PT. Contact information: [place] [address] [phone]          Relevant Imaging Results:  Relevant Lab  Results:   Additional Information    Shade Flood, LCSW

## 2021-06-20 NOTE — TOC Transition Note (Signed)
Transition of Care [facility]) - CM/SW Discharge Note   Patient Details  Name: Taylor Andrews MRN: [id number] Date of Birth: 1934-10-24  Transition of Care [facility]) CM/SW Contact:  Shade Flood, LCSW Phone Number: 06/20/2021, 2:03 PM   Clinical Narrative:     Pt stable for dc today per MD. Updated pt's [place]. Family to transport. DC clinical sent by fax.  Updated Cory at [place] of pt's dc and they will follow up.  There are no other TOC needs for dc.  Final next level of care: Assisted Living Barriers to Discharge: Barriers Resolved   Patient Goals and CMS Choice        Discharge Placement                       Discharge Plan and Services                          HH Arranged: PT [place] Agency: [place] Date [place]: 06/19/21   Representative spoke with at [place]: [place] ([place]) Interventions     Readmission Risk Interventions No flowsheet data found.

## 2021-06-21 DIAGNOSIS — E119 Type 2 diabetes mellitus without complications: Secondary | ICD-10-CM | POA: Diagnosis not present

## 2021-06-21 DIAGNOSIS — A419 Sepsis, unspecified organism: Secondary | ICD-10-CM | POA: Diagnosis not present

## 2021-06-21 DIAGNOSIS — I10 Essential (primary) hypertension: Secondary | ICD-10-CM | POA: Diagnosis not present

## 2021-06-21 DIAGNOSIS — G9341 Metabolic encephalopathy: Secondary | ICD-10-CM | POA: Diagnosis not present

## 2021-06-21 DIAGNOSIS — E860 Dehydration: Secondary | ICD-10-CM | POA: Diagnosis not present

## 2021-06-23 DIAGNOSIS — G9341 Metabolic encephalopathy: Secondary | ICD-10-CM | POA: Diagnosis not present

## 2021-06-23 DIAGNOSIS — E119 Type 2 diabetes mellitus without complications: Secondary | ICD-10-CM | POA: Diagnosis not present

## 2021-06-23 DIAGNOSIS — I10 Essential (primary) hypertension: Secondary | ICD-10-CM | POA: Diagnosis not present

## 2021-06-23 DIAGNOSIS — A419 Sepsis, unspecified organism: Secondary | ICD-10-CM | POA: Diagnosis not present

## 2021-06-23 DIAGNOSIS — E860 Dehydration: Secondary | ICD-10-CM | POA: Diagnosis not present

## 2021-06-23 LAB — CULTURE, BLOOD (SINGLE)
Culture: NO GROWTH
Special Requests: ADEQUATE

## 2021-06-26 DIAGNOSIS — I10 Essential (primary) hypertension: Secondary | ICD-10-CM | POA: Diagnosis not present

## 2021-06-26 DIAGNOSIS — E119 Type 2 diabetes mellitus without complications: Secondary | ICD-10-CM | POA: Diagnosis not present

## 2021-06-26 DIAGNOSIS — A419 Sepsis, unspecified organism: Secondary | ICD-10-CM | POA: Diagnosis not present

## 2021-06-26 DIAGNOSIS — G9341 Metabolic encephalopathy: Secondary | ICD-10-CM | POA: Diagnosis not present

## 2021-06-26 DIAGNOSIS — E860 Dehydration: Secondary | ICD-10-CM | POA: Diagnosis not present

## 2021-06-28 DIAGNOSIS — E860 Dehydration: Secondary | ICD-10-CM | POA: Diagnosis not present

## 2021-06-28 DIAGNOSIS — I10 Essential (primary) hypertension: Secondary | ICD-10-CM | POA: Diagnosis not present

## 2021-06-28 DIAGNOSIS — G9341 Metabolic encephalopathy: Secondary | ICD-10-CM | POA: Diagnosis not present

## 2021-06-28 DIAGNOSIS — E119 Type 2 diabetes mellitus without complications: Secondary | ICD-10-CM | POA: Diagnosis not present

## 2021-06-28 DIAGNOSIS — A419 Sepsis, unspecified organism: Secondary | ICD-10-CM | POA: Diagnosis not present

## 2021-07-03 DIAGNOSIS — G9341 Metabolic encephalopathy: Secondary | ICD-10-CM | POA: Diagnosis not present

## 2021-07-03 DIAGNOSIS — A419 Sepsis, unspecified organism: Secondary | ICD-10-CM | POA: Diagnosis not present

## 2021-07-03 DIAGNOSIS — I10 Essential (primary) hypertension: Secondary | ICD-10-CM | POA: Diagnosis not present

## 2021-07-03 DIAGNOSIS — E860 Dehydration: Secondary | ICD-10-CM | POA: Diagnosis not present

## 2021-07-04 DIAGNOSIS — G9341 Metabolic encephalopathy: Secondary | ICD-10-CM | POA: Diagnosis not present

## 2021-07-04 DIAGNOSIS — A419 Sepsis, unspecified organism: Secondary | ICD-10-CM | POA: Diagnosis not present

## 2021-07-04 DIAGNOSIS — I10 Essential (primary) hypertension: Secondary | ICD-10-CM | POA: Diagnosis not present

## 2021-07-04 DIAGNOSIS — E860 Dehydration: Secondary | ICD-10-CM | POA: Diagnosis not present

## 2021-07-04 DIAGNOSIS — E119 Type 2 diabetes mellitus without complications: Secondary | ICD-10-CM | POA: Diagnosis not present

## 2021-07-07 DIAGNOSIS — E860 Dehydration: Secondary | ICD-10-CM | POA: Diagnosis not present

## 2021-07-07 DIAGNOSIS — A419 Sepsis, unspecified organism: Secondary | ICD-10-CM | POA: Diagnosis not present

## 2021-07-07 DIAGNOSIS — Z79899 Other long term (current) drug therapy: Secondary | ICD-10-CM | POA: Diagnosis not present

## 2021-07-07 DIAGNOSIS — G9341 Metabolic encephalopathy: Secondary | ICD-10-CM | POA: Diagnosis not present

## 2021-07-07 DIAGNOSIS — Z681 Body mass index (BMI) 19 or less, adult: Secondary | ICD-10-CM | POA: Diagnosis not present

## 2021-07-07 DIAGNOSIS — E782 Mixed hyperlipidemia: Secondary | ICD-10-CM | POA: Diagnosis not present

## 2021-07-07 DIAGNOSIS — E119 Type 2 diabetes mellitus without complications: Secondary | ICD-10-CM | POA: Diagnosis not present

## 2021-07-07 DIAGNOSIS — K219 Gastro-esophageal reflux disease without esophagitis: Secondary | ICD-10-CM | POA: Diagnosis not present

## 2021-07-07 DIAGNOSIS — I10 Essential (primary) hypertension: Secondary | ICD-10-CM | POA: Diagnosis not present

## 2021-07-09 ENCOUNTER — Inpatient Hospital Stay (HOSPITAL_COMMUNITY)
Admission: EM | Admit: 2021-07-09 | Discharge: 2021-07-20 | DRG: 871 | Disposition: A | Discharge status: Skilled Nursing Facility | Payer: Medicare Other | Attending: Family Medicine | Admitting: Family Medicine

## 2021-07-09 ENCOUNTER — Emergency Department (HOSPITAL_COMMUNITY): Payer: Medicare Other

## 2021-07-09 ENCOUNTER — Other Ambulatory Visit: Payer: Self-pay

## 2021-07-09 ENCOUNTER — Encounter (HOSPITAL_COMMUNITY): Payer: Self-pay

## 2021-07-09 DIAGNOSIS — L03317 Cellulitis of buttock: Secondary | ICD-10-CM | POA: Diagnosis present

## 2021-07-09 DIAGNOSIS — L89150 Pressure ulcer of sacral region, unstageable: Secondary | ICD-10-CM | POA: Diagnosis present

## 2021-07-09 DIAGNOSIS — K219 Gastro-esophageal reflux disease without esophagitis: Secondary | ICD-10-CM | POA: Diagnosis present

## 2021-07-09 DIAGNOSIS — E1165 Type 2 diabetes mellitus with hyperglycemia: Secondary | ICD-10-CM | POA: Diagnosis not present

## 2021-07-09 DIAGNOSIS — M869 Osteomyelitis, unspecified: Secondary | ICD-10-CM

## 2021-07-09 DIAGNOSIS — I82432 Acute embolism and thrombosis of left popliteal vein: Secondary | ICD-10-CM | POA: Diagnosis not present

## 2021-07-09 DIAGNOSIS — F0150 Vascular dementia without behavioral disturbance: Secondary | ICD-10-CM | POA: Diagnosis not present

## 2021-07-09 DIAGNOSIS — I82402 Acute embolism and thrombosis of unspecified deep veins of left lower extremity: Secondary | ICD-10-CM | POA: Diagnosis not present

## 2021-07-09 DIAGNOSIS — I10 Essential (primary) hypertension: Secondary | ICD-10-CM | POA: Diagnosis present

## 2021-07-09 DIAGNOSIS — A4189 Other specified sepsis: Secondary | ICD-10-CM | POA: Diagnosis not present

## 2021-07-09 DIAGNOSIS — Z86008 Personal history of in-situ neoplasm of other site: Secondary | ICD-10-CM

## 2021-07-09 DIAGNOSIS — I2781 Cor pulmonale (chronic): Secondary | ICD-10-CM | POA: Diagnosis present

## 2021-07-09 DIAGNOSIS — R4182 Altered mental status, unspecified: Secondary | ICD-10-CM | POA: Diagnosis not present

## 2021-07-09 DIAGNOSIS — G9340 Encephalopathy, unspecified: Secondary | ICD-10-CM | POA: Diagnosis present

## 2021-07-09 DIAGNOSIS — R7989 Other specified abnormal findings of blood chemistry: Secondary | ICD-10-CM

## 2021-07-09 DIAGNOSIS — J9601 Acute respiratory failure with hypoxia: Secondary | ICD-10-CM | POA: Diagnosis present

## 2021-07-09 DIAGNOSIS — Z90710 Acquired absence of both cervix and uterus: Secondary | ICD-10-CM

## 2021-07-09 DIAGNOSIS — E860 Dehydration: Secondary | ICD-10-CM | POA: Diagnosis not present

## 2021-07-09 DIAGNOSIS — U071 COVID-19: Secondary | ICD-10-CM | POA: Diagnosis not present

## 2021-07-09 DIAGNOSIS — D649 Anemia, unspecified: Secondary | ICD-10-CM | POA: Diagnosis not present

## 2021-07-09 DIAGNOSIS — I2601 Septic pulmonary embolism with acute cor pulmonale: Secondary | ICD-10-CM | POA: Diagnosis not present

## 2021-07-09 DIAGNOSIS — J1282 Pneumonia due to coronavirus disease 2019: Secondary | ICD-10-CM | POA: Diagnosis not present

## 2021-07-09 DIAGNOSIS — G8929 Other chronic pain: Secondary | ICD-10-CM | POA: Diagnosis present

## 2021-07-09 DIAGNOSIS — R54 Age-related physical debility: Secondary | ICD-10-CM | POA: Diagnosis present

## 2021-07-09 DIAGNOSIS — F0390 Unspecified dementia without behavioral disturbance: Secondary | ICD-10-CM

## 2021-07-09 DIAGNOSIS — I2699 Other pulmonary embolism without acute cor pulmonale: Secondary | ICD-10-CM | POA: Diagnosis present

## 2021-07-09 DIAGNOSIS — Z86010 Personal history of colonic polyps: Secondary | ICD-10-CM

## 2021-07-09 DIAGNOSIS — K760 Fatty (change of) liver, not elsewhere classified: Secondary | ICD-10-CM | POA: Diagnosis not present

## 2021-07-09 DIAGNOSIS — E785 Hyperlipidemia, unspecified: Secondary | ICD-10-CM | POA: Diagnosis present

## 2021-07-09 DIAGNOSIS — G9341 Metabolic encephalopathy: Secondary | ICD-10-CM | POA: Diagnosis not present

## 2021-07-09 DIAGNOSIS — L03319 Cellulitis of trunk, unspecified: Secondary | ICD-10-CM | POA: Diagnosis not present

## 2021-07-09 DIAGNOSIS — R059 Cough, unspecified: Secondary | ICD-10-CM | POA: Diagnosis not present

## 2021-07-09 DIAGNOSIS — R109 Unspecified abdominal pain: Secondary | ICD-10-CM | POA: Diagnosis present

## 2021-07-09 DIAGNOSIS — E876 Hypokalemia: Secondary | ICD-10-CM | POA: Diagnosis present

## 2021-07-09 DIAGNOSIS — Z515 Encounter for palliative care: Secondary | ICD-10-CM | POA: Diagnosis not present

## 2021-07-09 DIAGNOSIS — Z91011 Allergy to milk products: Secondary | ICD-10-CM

## 2021-07-09 DIAGNOSIS — I499 Cardiac arrhythmia, unspecified: Secondary | ICD-10-CM | POA: Diagnosis not present

## 2021-07-09 DIAGNOSIS — Z66 Do not resuscitate: Secondary | ICD-10-CM | POA: Diagnosis not present

## 2021-07-09 DIAGNOSIS — R6520 Severe sepsis without septic shock: Secondary | ICD-10-CM | POA: Diagnosis not present

## 2021-07-09 DIAGNOSIS — T380X5A Adverse effect of glucocorticoids and synthetic analogues, initial encounter: Secondary | ICD-10-CM | POA: Diagnosis not present

## 2021-07-09 DIAGNOSIS — R739 Hyperglycemia, unspecified: Secondary | ICD-10-CM | POA: Diagnosis not present

## 2021-07-09 DIAGNOSIS — I82412 Acute embolism and thrombosis of left femoral vein: Secondary | ICD-10-CM | POA: Diagnosis present

## 2021-07-09 DIAGNOSIS — E119 Type 2 diabetes mellitus without complications: Secondary | ICD-10-CM | POA: Diagnosis not present

## 2021-07-09 DIAGNOSIS — E872 Acidosis: Secondary | ICD-10-CM | POA: Diagnosis present

## 2021-07-09 DIAGNOSIS — M4628 Osteomyelitis of vertebra, sacral and sacrococcygeal region: Secondary | ICD-10-CM | POA: Diagnosis not present

## 2021-07-09 DIAGNOSIS — Z7189 Other specified counseling: Secondary | ICD-10-CM | POA: Diagnosis not present

## 2021-07-09 DIAGNOSIS — R531 Weakness: Secondary | ICD-10-CM | POA: Diagnosis not present

## 2021-07-09 DIAGNOSIS — R404 Transient alteration of awareness: Secondary | ICD-10-CM | POA: Diagnosis not present

## 2021-07-09 DIAGNOSIS — A419 Sepsis, unspecified organism: Secondary | ICD-10-CM

## 2021-07-09 DIAGNOSIS — R627 Adult failure to thrive: Secondary | ICD-10-CM | POA: Diagnosis present

## 2021-07-09 DIAGNOSIS — Z91041 Radiographic dye allergy status: Secondary | ICD-10-CM

## 2021-07-09 DIAGNOSIS — R633 Feeding difficulties, unspecified: Secondary | ICD-10-CM | POA: Diagnosis present

## 2021-07-09 DIAGNOSIS — Z743 Need for continuous supervision: Secondary | ICD-10-CM | POA: Diagnosis not present

## 2021-07-09 DIAGNOSIS — E1169 Type 2 diabetes mellitus with other specified complication: Secondary | ICD-10-CM | POA: Diagnosis present

## 2021-07-09 DIAGNOSIS — I5021 Acute systolic (congestive) heart failure: Secondary | ICD-10-CM | POA: Diagnosis not present

## 2021-07-09 DIAGNOSIS — Z79899 Other long term (current) drug therapy: Secondary | ICD-10-CM

## 2021-07-09 DIAGNOSIS — Z86718 Personal history of other venous thrombosis and embolism: Secondary | ICD-10-CM

## 2021-07-09 DIAGNOSIS — R6889 Other general symptoms and signs: Secondary | ICD-10-CM | POA: Diagnosis not present

## 2021-07-09 HISTORY — DX: Altered mental status, unspecified: R41.82

## 2021-07-09 LAB — COMPREHENSIVE METABOLIC PANEL
ALT: 20 U/L (ref 0–44)
AST: 30 U/L (ref 15–41)
Albumin: 2.8 g/dL — ABNORMAL LOW (ref 3.5–5.0)
Alkaline Phosphatase: 74 U/L (ref 38–126)
Anion gap: 7 (ref 5–15)
BUN: 29 mg/dL — ABNORMAL HIGH (ref 8–23)
CO2: 27 mmol/L (ref 22–32)
Calcium: 8.7 mg/dL — ABNORMAL LOW (ref 8.9–10.3)
Chloride: 109 mmol/L (ref 98–111)
Creatinine, Ser: 1.07 mg/dL — ABNORMAL HIGH (ref 0.44–1.00)
GFR, Estimated: 51 mL/min — ABNORMAL LOW (ref >60)
Glucose, Bld: 254 mg/dL — ABNORMAL HIGH (ref 70–99)
Potassium: 3.8 mmol/L (ref 3.5–5.1)
Sodium: 143 mmol/L (ref 135–145)
Total Bilirubin: 0.5 mg/dL (ref 0.3–1.2)
Total Protein: 7.7 g/dL (ref 6.5–8.1)

## 2021-07-09 LAB — CBC WITH DIFFERENTIAL/PLATELET
Band Neutrophils: 2 %
Basophils Absolute: 0 10*3/uL (ref 0.0–0.1)
Basophils Relative: 0 %
Eosinophils Absolute: 0 10*3/uL (ref 0.0–0.5)
Eosinophils Relative: 0 %
HCT: 38.6 % (ref 36.0–46.0)
Hemoglobin: 12 g/dL (ref 12.0–15.0)
Lymphocytes Relative: 6 %
Lymphs Abs: 1.2 10*3/uL (ref 0.7–4.0)
MCH: 28.6 pg (ref 26.0–34.0)
MCHC: 31.1 g/dL (ref 30.0–36.0)
MCV: 91.9 fL (ref 80.0–100.0)
Metamyelocytes Relative: 1 %
Monocytes Absolute: 0.6 10*3/uL (ref 0.1–1.0)
Monocytes Relative: 3 %
Neutro Abs: 18.7 10*3/uL — ABNORMAL HIGH (ref 1.7–7.7)
Neutrophils Relative %: 88 %
Platelets: 365 10*3/uL (ref 150–400)
RBC: 4.2 MIL/uL (ref 3.87–5.11)
RDW: 14.2 % (ref 11.5–15.5)
WBC: 20.8 10*3/uL — ABNORMAL HIGH (ref 4.0–10.5)
nRBC: 0 % (ref 0.0–0.2)

## 2021-07-09 LAB — URINALYSIS, ROUTINE W REFLEX MICROSCOPIC
Bilirubin Urine: NEGATIVE
Glucose, UA: NEGATIVE mg/dL
Hgb urine dipstick: NEGATIVE
Ketones, ur: NEGATIVE mg/dL
Leukocytes,Ua: NEGATIVE
Nitrite: NEGATIVE
Specific Gravity, Urine: 1.025 (ref 1.005–1.030)
pH: 5.5 (ref 5.0–8.0)

## 2021-07-09 LAB — TROPONIN I (HIGH SENSITIVITY)
Troponin I (High Sensitivity): 14 ng/L (ref <18)
Troponin I (High Sensitivity): 15 ng/L (ref <18)

## 2021-07-09 LAB — LACTATE DEHYDROGENASE: LDH: 248 U/L — ABNORMAL HIGH (ref 98–192)

## 2021-07-09 LAB — URINALYSIS, MICROSCOPIC (REFLEX): Bacteria, UA: NONE SEEN

## 2021-07-09 LAB — LACTIC ACID, PLASMA
Lactic Acid, Venous: 4.7 mmol/L (ref 0.5–1.9)
Lactic Acid, Venous: 2.4 mmol/L (ref 0.5–1.9)

## 2021-07-09 LAB — PROCALCITONIN: Procalcitonin: 1.17 ng/mL

## 2021-07-09 LAB — RESP PANEL BY RT-PCR (FLU A&B, COVID) ARPGX2
Influenza A by PCR: NEGATIVE
Influenza B by PCR: NEGATIVE
SARS Coronavirus 2 by RT PCR: POSITIVE — AB

## 2021-07-09 LAB — D-DIMER, QUANTITATIVE: D-Dimer, Quant: 6.67 ug/mL-FEU — ABNORMAL HIGH (ref 0.00–0.50)

## 2021-07-09 MED ORDER — ONDANSETRON HCL 4 MG PO TABS: 4.0000 mg | ORAL_TABLET | Freq: Four times a day (QID) | ORAL | Status: DC | PRN

## 2021-07-09 MED ORDER — ONDANSETRON HCL 4 MG/2ML IJ SOLN: 4.0000 mg | Freq: Four times a day (QID) | INTRAMUSCULAR | Status: DC | PRN

## 2021-07-09 MED ORDER — INSULIN ASPART 100 UNIT/ML IJ SOLN: 0.0000 [IU] | Freq: Three times a day (TID) | INTRAMUSCULAR | Status: DC

## 2021-07-09 MED ORDER — SODIUM CHLORIDE 0.9 % IV SOLN
2.0000 g | Freq: Once | INTRAVENOUS | Status: AC
  Administered 2021-07-09: 2 g via INTRAVENOUS
  Filled 2021-07-09: qty 2

## 2021-07-09 MED ORDER — ALBUTEROL SULFATE HFA 108 (90 BASE) MCG/ACT IN AERS
2.0000 | INHALATION_SPRAY | Freq: Four times a day (QID) | RESPIRATORY_TRACT | Status: DC
  Filled 2021-07-09: qty 6.7

## 2021-07-09 MED ORDER — LACTATED RINGERS IV SOLN: INTRAVENOUS | Status: DC

## 2021-07-09 MED ORDER — VANCOMYCIN HCL 500 MG/100ML IV SOLN: 500.0000 mg | INTRAVENOUS | Status: DC

## 2021-07-09 MED ORDER — SODIUM CHLORIDE 0.9 % IV SOLN: 2.0000 g | INTRAVENOUS | Status: DC

## 2021-07-09 MED ORDER — LACTATED RINGERS IV BOLUS (SEPSIS): 250.0000 mL | Freq: Once | INTRAVENOUS | Status: DC

## 2021-07-09 MED ORDER — LACTATED RINGERS IV BOLUS (SEPSIS)
1000.0000 mL | Freq: Once | INTRAVENOUS | Status: AC
  Administered 2021-07-09: 1000 mL via INTRAVENOUS

## 2021-07-09 MED ORDER — SODIUM CHLORIDE 0.9 % IV SOLN: INTRAVENOUS | Status: DC

## 2021-07-09 MED ORDER — LACTATED RINGERS IV BOLUS (SEPSIS)
500.0000 mL | Freq: Once | INTRAVENOUS | Status: AC
  Administered 2021-07-09: 500 mL via INTRAVENOUS

## 2021-07-09 MED ORDER — ENOXAPARIN SODIUM 30 MG/0.3ML IJ SOSY
30.0000 mg | PREFILLED_SYRINGE | INTRAMUSCULAR | Status: DC
  Administered 2021-07-10 – 2021-07-11 (×2): 30 mg via SUBCUTANEOUS
  Filled 2021-07-09 (×2): qty 0.3

## 2021-07-09 MED ORDER — METRONIDAZOLE 500 MG/100ML IV SOLN
500.0000 mg | Freq: Once | INTRAVENOUS | Status: AC
  Administered 2021-07-09: 500 mg via INTRAVENOUS
  Filled 2021-07-09: qty 100

## 2021-07-09 MED ORDER — VANCOMYCIN HCL IN DEXTROSE 1-5 GM/200ML-% IV SOLN
1000.0000 mg | Freq: Once | INTRAVENOUS | Status: AC
  Administered 2021-07-09: 1000 mg via INTRAVENOUS
  Filled 2021-07-09: qty 200

## 2021-07-09 NOTE — ED Triage Notes (Signed)
Pt brought to ED via [facility] for altered mental status, unable to cough up anything, withdraws from pain only, crackles noted bilaterally. Respiratory at bedside. O2 sats initially 80%, up to 90% with NR

## 2021-07-09 NOTE — Progress Notes (Signed)
Communication took place with provider, no plans to repeat lactic at this time with trends going down

## 2021-07-09 NOTE — ED Provider Notes (Signed)
[facility] EMERGENCY DEPARTMENT Provider Note  CSN: [id number] Arrival date & time: 07/09/21 1612    History Chief Complaint  Patient presents with   Altered Mental Status    Taylor Andrews is a 87 y.o. female brought to the ED via EMS from her rest home (no paperwork available) for evaluation of altered mental status and cough. She is unable to give any further history. Level 5 caveat applies. From recent admission for similar, her baseline is pleasantly confused but conversant.    Past Medical History:  Diagnosis Date   Adenomatous colon polyp    carcinoma in situ   AMS (altered mental status) 07/09/2021   Chronic abdominal pain    Dementia ([place])    Diabetes mellitus    Fatty liver    on [place] normal LFT'S   GERD (gastroesophageal reflux disease)    Hyperlipidemia    Hypertension    Iron deficiency    Chronic current parameter normal; 4/09 iron was 12 sat 5%   SDH (subdural hematoma) ([place]) 01/2014   chronic    Past Surgical History:  Procedure Laterality Date   ABDOMINAL HYSTERECTOMY     ANKLE SURGERY     Left ;pins   COLONOSCOPY  8/05   internal hemorrhoids,polyp bengin   COLONOSCOPY  01/12/2011   [id number] rectum/ 5-mm polyp at the splenic flexure s/p polypectomy, tattooing, and scarred hepatic flexure, absolutely no evidence of any residual polyp tissue or neoplasm/Scattered left-sided diverticula. Tubular adenoma, no high-grade dysplasia.Repeat TCS due 01/2016   COLONOSCOPY  06/16/2009   [id number] hemorrhoids, otherwise normal rectum.Flat polypoid lesion at the hepatic flexure, debulked with piecemeal snare polypectomy.  This area was also tattooed Transverse colon and descending colon  flat adenomatous appearing polyps resected with hot snare cautery as well. Tubulovillous adenomas   COLONOSCOPY  December 2010   Dr. Gala Romney: Flat sessile hepatic flexure polyp status post saline-assisted piecemeal polypectomy. Fragments of adenomatous polyp with high-grade dysplasia.    COLONOSCOPY  March 2011   Dr. Gala Romney: Minimal persisting polypoid tissue at site of previous piecemeal polypectomy status post hot snare polypectomy. Tubular adenoma, no high-grade dysplasia   CRANIOTOMY Right 01/23/2014   Procedure: CRANIOTOMY HEMATOMA EVACUATION SUBDURAL;  Surgeon: Eustace Moore, MD;  Location: [facility];  Service: Neurosurgery;  Laterality: Right;   ESOPHAGOGASTRODUODENOSCOPY  6/05   tiny antral erosin with some deformity of pyloric channel   VESICOVAGINAL FISTULA CLOSURE W/ TAH      Family History  Problem Relation Age of Onset   Lupus Sister    Other Sister        ? Liver cancer    Social History   Tobacco Use   Smoking status: Never   Smokeless tobacco: Never  Substance Use Topics   Alcohol use: No   Drug use: No     Home Medications Prior to Admission medications   Medication Sig Start Date End Date Taking? Authorizing Provider  amLODipine (NORVASC) 10 MG tablet Take 10 mg by mouth daily.     [provider]  docusate sodium (COLACE) 100 MG capsule Take 100 mg by mouth 3 (three) times daily.    [provider]  donepezil (ARICEPT) 10 MG tablet Take 10 mg by mouth daily.     [provider]  hydrochlorothiazide 25 MG tablet Take 25 mg by mouth daily.     [provider]  LORazepam (ATIVAN) 0.5 MG tablet Take 0.5 mg by mouth at bedtime. 06/13/21   [provider]  memantine (NAMENDA) 10 MG tablet Take 10 mg by mouth 2 (two) times daily. 06/13/21   [provider]  niacin (NIASPAN) 500 MG CR tablet Take 500 mg by mouth daily.  12/23/13   [provider]  Omega-3 Fatty Acids (FISH OIL) 1000 MG CAPS Take 1 capsule by mouth 2 (two) times daily with a meal.     [provider]  oxybutynin (DITROPAN) 5 MG tablet Take 5 mg by mouth 2 (two) times daily. 06/13/21   [provider]  potassium chloride SA (K-DUR,KLOR-CON) 20 MEQ tablet Take 20 mEq by mouth daily. 06/05/17   [provider]     Allergies    Milk-related compounds and Iohexol   Review of Systems   Review of Systems Unable to assess due to mental status.    Physical Exam BP (!) 112/44   Pulse 74   Temp 98 F (36.7 C) (Axillary)   Resp 17   Ht '5\' 1"'$  (1.549 m)   Wt 42.6 kg   SpO2 100%   BMI 17.75 kg/m   Physical Exam Vitals and nursing note reviewed.  Constitutional:      Appearance: Normal appearance.  HENT:     Head: Normocephalic and atraumatic.     Nose: Nose normal.     Mouth/Throat:     Mouth: Mucous membranes are moist.  Eyes:     Extraocular Movements: Extraocular movements intact.     Conjunctiva/sclera: Conjunctivae normal.  Cardiovascular:     Rate and Rhythm: Normal rate.  Pulmonary:     Effort: Pulmonary effort is normal.     Breath sounds: Rhonchi present.  Abdominal:     General: Abdomen is flat.     Palpations: Abdomen is soft.     Tenderness: There is no abdominal tenderness.  Musculoskeletal:        General: No swelling. Normal range of motion.     Cervical back: Neck supple.  Skin:    General: Skin is warm and dry.  Neurological:     General: No focal deficit present.     Mental Status: She is disoriented.  Psychiatric:        Mood and Affect: Mood normal.     ED Results / Procedures / Treatments   Labs (all labs ordered are listed, but only abnormal results are displayed) Labs Reviewed  RESP PANEL BY RT-PCR (FLU A&B, COVID) ARPGX2 - Abnormal; Notable for the following components:      Result Value   SARS Coronavirus 2 by RT PCR POSITIVE (*)    All other components within normal limits  COMPREHENSIVE METABOLIC PANEL - Abnormal; Notable for the following components:   Glucose, Bld 254 (*)    BUN 29 (*)    Creatinine, Ser 1.07 (*)    Calcium 8.7 (*)    Albumin 2.8 (*)    GFR, Estimated 51 (*)    All other components within normal limits  CBC WITH DIFFERENTIAL/PLATELET - Abnormal; Notable for the following components:   WBC 20.8 (*)     Neutro Abs 18.7 (*)    All other components within normal limits  LACTIC ACID, PLASMA - Abnormal; Notable for the following components:   Lactic Acid, Venous 4.7 (*)    All other components within normal limits  LACTIC ACID, PLASMA - Abnormal; Notable for the following components:   Lactic Acid, Venous 2.4 (*)    All other components within normal limits  URINALYSIS, ROUTINE W REFLEX  MICROSCOPIC - Abnormal; Notable for the following components:   APPearance HAZY (*)    Protein, ur TRACE (*)    All other components within normal limits  CULTURE, BLOOD (ROUTINE X 2)  CULTURE, BLOOD (ROUTINE X 2)  MRSA NEXT GEN BY PCR, NASAL  URINALYSIS, MICROSCOPIC (REFLEX)  CBC WITH DIFFERENTIAL/PLATELET  COMPREHENSIVE METABOLIC PANEL  C-REACTIVE PROTEIN  D-DIMER, QUANTITATIVE  FERRITIN  MAGNESIUM  PHOSPHORUS  BLOOD GAS, ARTERIAL  PROCALCITONIN  PROCALCITONIN  LACTATE DEHYDROGENASE  FERRITIN  C-REACTIVE PROTEIN  D-DIMER, QUANTITATIVE  TROPONIN I (HIGH SENSITIVITY)  TROPONIN I (HIGH SENSITIVITY)    EKG None  Radiology CT Head Wo Contrast  Result Date: 07/09/2021 CLINICAL DATA:  Mental status changes, history diabetes mellitus, hypertension, dementia; prior history of subdural hematoma and craniotomy EXAM: CT HEAD WITHOUT CONTRAST TECHNIQUE: Contiguous axial images were obtained from the base of the skull through the vertex without intravenous contrast. Sagittal and coronal MPR images reconstructed from axial data set. COMPARISON:  06/18/2021 FINDINGS: Brain: Generalized atrophy. Normal ventricular morphology. No midline shift or mass effect. Small vessel chronic ischemic changes of deep cerebral white matter. No intracranial hemorrhage, mass lesion, or evidence of acute infarction. Vascular: No hyperdense vessels Skull: Prior RIGHT frontal craniotomy.  Skull otherwise intact. Sinuses/Orbits: Air-fluid levels and mucus within LEFT maxillary and RIGHT sphenoid sinuses. Remaining paranasal  sinuses mastoid air cells clear. Other: N/A IMPRESSION: Atrophy with small vessel chronic ischemic changes of deep cerebral white matter. No acute intracranial abnormalities. Prior RIGHT frontal craniotomy. Sphenoid and LEFT maxillary sinus disease. Electronically Signed   By: Lavonia Dana M.D.   On: 07/09/2021 17:25   DG Chest Port 1 View  Result Date: 07/09/2021 CLINICAL DATA:  Cough and weakness.  Altered mental status. EXAM: PORTABLE CHEST 1 VIEW COMPARISON:  06/18/2021 FINDINGS: The cardiomediastinal contours are normal. Atherosclerosis of the aorta. Pulmonary vasculature is normal. No consolidation, pleural effusion, or pneumothorax. Diffuse bony under mineralization. No acute osseous abnormalities are seen. IMPRESSION: No acute chest findings. Electronically Signed   By: Keith Rake M.D.   On: 07/09/2021 17:47    Procedures .Critical Care  Date/Time: 07/09/2021 11:17 PM Performed by: Truddie Hidden, MD Authorized by: Truddie Hidden, MD   Critical care provider statement:    Critical care time (minutes):  40   Critical care time was exclusive of:  Separately billable procedures and treating other patients   Critical care was necessary to treat or prevent imminent or life-threatening deterioration of the following conditions:  Sepsis   Critical care was time spent personally by me on the following activities:  Discussions with consultants, evaluation of patient's response to treatment, examination of patient, ordering and performing treatments and interventions, ordering and review of laboratory studies, ordering and review of radiographic studies, pulse oximetry, re-evaluation of patient's condition, obtaining history from patient or surrogate and review of old charts   Care discussed with: admitting provider    Medications Ordered in the ED Medications  lactated ringers bolus 1,000 mL (0 mLs Intravenous Stopped 07/09/21 1930)    And  lactated ringers bolus 500 mL (0 mLs  Intravenous Stopped 07/09/21 2001)    And  lactated ringers bolus 250 mL (250 mLs Intravenous Not Given 07/09/21 2259)  enoxaparin (LOVENOX) injection 30 mg (has no administration in time range)  0.9 %  sodium chloride infusion (has no administration in time range)  albuterol (VENTOLIN HFA) 108 (90 Base) MCG/ACT inhaler 2 puff (has no administration in time range)  ondansetron (ZOFRAN)  tablet 4 mg (has no administration in time range)    Or  ondansetron (ZOFRAN) injection 4 mg (has no administration in time range)  insulin aspart (novoLOG) injection 0-9 Units (has no administration in time range)  ceFEPIme (MAXIPIME) 2 g in sodium chloride 0.9 % 100 mL IVPB (0 g Intravenous Stopped 07/09/21 1850)  metroNIDAZOLE (FLAGYL) IVPB 500 mg (0 mg Intravenous Stopped 07/09/21 1922)  vancomycin (VANCOCIN) IVPB 1000 mg/200 mL premix (0 mg Intravenous Stopped 07/09/21 1945)     MDM Rules/Calculators/A&P MDM Patient returns for another episode of confusion. Was admitted recently for same with concerns of sepsis although no source was found. She was improved after a couple of days and returned to her adult care home. Will recheck labs, CT, CXR and EKG today to determine any acute process.   ED Course  I have reviewed the triage vital signs and the nursing notes.  Pertinent labs & imaging results that were available during my care of the patient were reviewed by me and considered in my medical decision making (see chart for details).  Clinical Course as of 07/09/21 2318  Nancy Fetter Jul 09, 2021  1712 CBC shows WBC increased from previous admission. CMP is unremarkable aside from mildly elevate glucose. Trop is neg.  [CS]  [id number] CT without acute process.  [CS]  [id number] Patient's lactic acid is significantly elevated. She is not febrile or hypotensive, but does have tachycardia and leukocytosis with cough as a possible source of infection. Will initiate LR bolus and broad spectrum Abx. Awaiting UA and CXR.  [CS]  [id number] UA neg  for infection. Lactic acid is improving. Will discuss admission with the hospitalist.  [CS]  [place] with Dr. Arlyce Dice, hospitalist, who will evaluate the patient. [CS]  [id number] Covid is positive [CS]    Clinical Course User Index [CS] Truddie Hidden, MD    Final Clinical Impression(s) / ED Diagnoses Final diagnoses:  Altered mental status, unspecified altered mental status type  Sepsis with encephalopathy without septic shock, due to unspecified organism [facility])  COVID-19    Rx / DC Orders ED Discharge Orders     None        Truddie Hidden, MD 07/09/21 2318

## 2021-07-09 NOTE — ED Notes (Signed)
Pt responsive to pain, pt continues to be non verbal, o2 sensor repositioned, pt sat up in bed, o2 sat 100% on non rebreather, pt switched to 2L o2. Pt has large pressure ulcer to sacral area, no active drainage, approx 9 cm in length and 6 cm in width. I did talk to niece who is POA and this is a pre-existing wound that is being treated by wound care expert. Niece explained pt was seen here last Sunday and admitted until Tuesday and seemed to be improving until 2 days ago

## 2021-07-09 NOTE — ED Notes (Signed)
Fluid bolus in process with antibiotic infusions. No change in pt's mentation, pt on cardiac monitor

## 2021-07-09 NOTE — H&P (Addendum)
History and Physical    DYAMOND Andrews [id number] DOB: 27-Apr-1934 DOA: 07/09/2021  PCP: Redmond School, MD   [facility] coming from: [facility]  I have personally briefly reviewed patient's old medical records in [place]  Chief Complaint: Altered mental status  HPI: [facility] is a 87 y.o. female with medical history significant for [place], hypertension, diabetes mellitus, subdural hematoma 2015. Patient was brought to the ED from nursing home with reports of altered mental status and cough.  At the time of my evaluation, patient is somnolent, not answering questions.  Patient was brought to the ED with reports of cough, change in mental status.   I am unable to reach nursing home.  I talked to patient's niece who is power of attorney- [facility], she reports patient has been sick since last Sunday-at least 1 week.  Has been weak, not eating, not wanting to get up or walk.  At baseline, patient can feed her self, occasionally recognizes family, but cannot answer questions.  Today patient started coughing, so she was brought to the ED.  Hospitalization 8/14-8/16 for dehydration and possible sepsis with increased procalcitonin, patient was started on Vanco and cefepime, brain MRI was unremarkable.  Cultures were unremarkable.  Patient was discharged home on a course of antibiotics.  ED Course: Temperature 99.1.  Heart rate 70s to 112 initially ED.  Respiratory rate 17-22.  Blood pressure 99-140.   O2 sats on room air, 100%.  Head CT and chest x-ray without acute abnormality.  Lactic acidosis 4.7 > 2.4.  UA unremarkable.  Troponin 14 > 15.  WBC 20.8.   Blood cultures obtained.  Broad-spectrum antibiotics Vanco cefepime and metronidazole ordered.  COVID test later resulted positive.  Hospitalist to admit.   Review of Systems: Unable to ascertain due to altered mental status.  Past Medical History:  Diagnosis Date   Adenomatous colon polyp    carcinoma in situ   Chronic  abdominal pain    Dementia ([place])    Diabetes mellitus    Fatty liver    on [place] normal LFT'S   GERD (gastroesophageal reflux disease)    Hyperlipidemia    Hypertension    Iron deficiency    Chronic current parameter normal; 4/09 iron was 12 sat 5%   SDH (subdural hematoma) ([place]) 01/2014   chronic    Past Surgical History:  Procedure Laterality Date   ABDOMINAL HYSTERECTOMY     ANKLE SURGERY     Left ;pins   COLONOSCOPY  8/05   internal hemorrhoids,polyp bengin   COLONOSCOPY  01/12/2011   [id number] rectum/ 5-mm polyp at the splenic flexure s/p polypectomy, tattooing, and scarred hepatic flexure, absolutely no evidence of any residual polyp tissue or neoplasm/Scattered left-sided diverticula. Tubular adenoma, no high-grade dysplasia.Repeat TCS due 01/2016   COLONOSCOPY  06/16/2009   [id number] hemorrhoids, otherwise normal rectum.Flat polypoid lesion at the hepatic flexure, debulked with piecemeal snare polypectomy.  This area was also tattooed Transverse colon and descending colon  flat adenomatous appearing polyps resected with hot snare cautery as well. Tubulovillous adenomas   COLONOSCOPY  December 2010   Dr. Gala Romney: Flat sessile hepatic flexure polyp status post saline-assisted piecemeal polypectomy. Fragments of adenomatous polyp with high-grade dysplasia.   COLONOSCOPY  March 2011   Dr. Gala Romney: Minimal persisting polypoid tissue at site of previous piecemeal polypectomy status post hot snare polypectomy. Tubular adenoma, no high-grade dysplasia   CRANIOTOMY Right 01/23/2014   Procedure: CRANIOTOMY HEMATOMA EVACUATION SUBDURAL;  Surgeon: Shanon Brow  Adah Salvage, MD;  Location: [facility];  Service: Neurosurgery;  Laterality: Right;   ESOPHAGOGASTRODUODENOSCOPY  6/05   tiny antral erosin with some deformity of pyloric channel   VESICOVAGINAL FISTULA CLOSURE W/ TAH       reports that she has never smoked. She has never used smokeless tobacco. She reports that she does not drink alcohol and  does not use drugs.  Allergies  Allergen Reactions   Milk-Related Compounds Other (See Comments)    Lactose Intolerant    Iohexol Other (See Comments)     Desc: PT STATES THAT THE CONTRAST THAT SHE HAD IN 2004 MADE HER SICK, PT COULD NOT SPECIFY REACTION TYPES     Family History  Problem Relation Age of Onset   Lupus Sister    Other Sister        ? Liver cancer    Prior to Admission medications   Medication Sig Start Date End Date Taking? Authorizing Provider  amLODipine (NORVASC) 10 MG tablet Take 10 mg by mouth daily.     [provider]  docusate sodium (COLACE) 100 MG capsule Take 100 mg by mouth 3 (three) times daily.    [provider]  donepezil (ARICEPT) 10 MG tablet Take 10 mg by mouth daily.     [provider]  hydrochlorothiazide 25 MG tablet Take 25 mg by mouth daily.     [provider]  LORazepam (ATIVAN) 0.5 MG tablet Take 0.5 mg by mouth at bedtime. 06/13/21   [provider]  memantine (NAMENDA) 10 MG tablet Take 10 mg by mouth 2 (two) times daily. 06/13/21   [provider]  niacin (NIASPAN) 500 MG CR tablet Take 500 mg by mouth daily.  12/23/13   [provider]  Omega-3 Fatty Acids (FISH OIL) 1000 MG CAPS Take 1 capsule by mouth 2 (two) times daily with a meal.     [provider]  oxybutynin (DITROPAN) 5 MG tablet Take 5 mg by mouth 2 (two) times daily. 06/13/21   [provider]  potassium chloride SA (K-DUR,KLOR-CON) 20 MEQ tablet Take 20 mEq by mouth daily. 06/05/17   [provider]    Physical Exam: Vitals:   07/09/21 1624 07/09/21 1810 07/09/21 1820 07/09/21 1830  BP: (!) 140/55 (!) 134/108 (!) 126/97 (!) 116/35  Pulse: (!) 112  83 92  Resp: '16 20 18 '$ (!) 21  Temp: 98.5 F (36.9 C)     TempSrc: Oral     SpO2: 100%  100% 100%  Weight:      Height:        Constitutional: Appears somnolent, but during examination patient started muttering/talking, but not in response  to questions. Vitals:   07/09/21 1624 07/09/21 1810 07/09/21 1820 07/09/21 1830  BP: (!) 140/55 (!) 134/108 (!) 126/97 (!) 116/35  Pulse: (!) 112  83 92  Resp: '16 20 18 '$ (!) 21  Temp: 98.5 F (36.9 C)     TempSrc: Oral     SpO2: 100%  100% 100%  Weight:      Height:       Eyes: Refuses to allow eye examination, purposefully closing her eyes tightly shut ENMT: Mucous membranes are moist.   Neck: normal, supple, no masses, no thyromegaly Respiratory: Upper airway transmitted sounds, no wheezing, no crackles. Normal respiratory effort. No accessory muscle use.  Cardiovascular: Regular rate and rhythm, no murmurs / rubs / gallops. No extremity edema.  Warm and appear well-perfused.  Abdomen: no  tenderness, no masses palpated. No hepatosplenomegaly. Bowel sounds positive.  Musculoskeletal: no clubbing / cyanosis. No joint deformity upper and lower extremities. Good ROM, no contractures. Normal muscle tone.  Skin: no rashes, lesions, ulcers. No induration Neurologic: Exam limited by patient's altered mental status, able to talk, moving upper extremities but not cooperative with lower extremity exam. Psychiatric: Appears somnolent.   Labs on Admission: I have personally reviewed following labs and imaging studies  CBC: Recent Labs  Lab 07/09/21 1628  WBC 20.8*  NEUTROABS 18.7*  HGB 12.0  HCT 38.6  MCV 91.9  PLT 99991111   Basic Metabolic Panel: Recent Labs  Lab 07/09/21 1628  NA 143  K 3.8  CL 109  CO2 27  GLUCOSE 254*  BUN 29*  CREATININE 1.07*  CALCIUM 8.7*   Liver Function Tests: Recent Labs  Lab 07/09/21 1628  AST 30  ALT 20  ALKPHOS 74  BILITOT 0.5  PROT 7.7  ALBUMIN 2.8*   Urine analysis:    Component Value Date/Time   COLORURINE YELLOW 07/09/2021 [place] (A) 07/09/2021 1748   LABSPEC 1.025 07/09/2021 1748   PHURINE 5.5 07/09/2021 [place] 07/09/2021 [place] 07/09/2021 [place] 07/09/2021  1748   KETONESUR NEGATIVE 07/09/2021 1748   PROTEINUR TRACE (A) 07/09/2021 1748   UROBILINOGEN 0.2 07/13/2015 1024   NITRITE NEGATIVE 07/09/2021 1748   LEUKOCYTESUR NEGATIVE 07/09/2021 1748    Radiological Exams on Admission: CT Head Wo Contrast  Result Date: 07/09/2021 CLINICAL DATA:  Mental status changes, history diabetes mellitus, hypertension, dementia; prior history of subdural hematoma and craniotomy EXAM: CT HEAD WITHOUT CONTRAST TECHNIQUE: Contiguous axial images were obtained from the base of the skull through the vertex without intravenous contrast. Sagittal and coronal MPR images reconstructed from axial data set. COMPARISON:  06/18/2021 FINDINGS: Brain: Generalized atrophy. Normal ventricular morphology. No midline shift or mass effect. Small vessel chronic ischemic changes of deep cerebral white matter. No intracranial hemorrhage, mass lesion, or evidence of acute infarction. Vascular: No hyperdense vessels Skull: Prior RIGHT frontal craniotomy.  Skull otherwise intact. Sinuses/Orbits: Air-fluid levels and mucus within LEFT maxillary and RIGHT sphenoid sinuses. Remaining paranasal sinuses mastoid air cells clear. Other: N/A IMPRESSION: Atrophy with small vessel chronic ischemic changes of deep cerebral white matter. No acute intracranial abnormalities. Prior RIGHT frontal craniotomy. Sphenoid and LEFT maxillary sinus disease. Electronically Signed   By: Lavonia Dana M.D.   On: 07/09/2021 17:25   DG Chest Port 1 View  Result Date: 07/09/2021 CLINICAL DATA:  Cough and weakness.  Altered mental status. EXAM: PORTABLE CHEST 1 VIEW COMPARISON:  06/18/2021 FINDINGS: The cardiomediastinal contours are normal. Atherosclerosis of the aorta. Pulmonary vasculature is normal. No consolidation, pleural effusion, or pneumothorax. Diffuse bony under mineralization. No acute osseous abnormalities are seen. IMPRESSION: No acute chest findings. Electronically Signed   By: Keith Rake M.D.   On:  07/09/2021 17:47    EKG: pending  Assessment/Plan Principal Problem:   Acute encephalopathy Active Problems:   Dementia (HCC)   HTN (hypertension)   DM (diabetes mellitus), type 2 ([place])   COVID-19 virus infection   Acute metabolic encephalopathy-with baseline dementia.  Likely secondary to COVID-19 infection, dehydration.  Head CT, chest x-ray without acute abnormality.  UA not suggestive of infection.  No focus of infection identified. -Hold broad-spectrum antibiotics for now - Obtain and Trend procalcitonin - 1.75L of fluids given continue N/s 75cc/hr - ABG -Follow-up blood cultures -N.p.o.  for now till improvement in mental status  COVID-19 infection-chest x-ray unremarkable.  O2 sats 91- 100% on room air, was temporarily placed on nonrebreather.  Symptoms started at least a week ago,.  Per niece, patient has gotten at least 2 doses of COVID vaccines, unsure if booster was given. -Outside window for paxlovid. -Obtain and trend inflammatory markers -Inhalers as needed  Leukocytosis, lactic acidosis-WBC 20.8, lactic acid 4.7 > 2.4, downtrending even before complete fluid bolus was given.  No focus of infection identified. - Trend.  Hypertension-Stable -Hold HCTZ while hydrating -Resume Norvasc  Dementia-at baseline confused as of, occasionally recognizes family, not conversant.  Controlled DM-diet controlled.  Blood glucose 254.  A1c 5.2. - SSI- S q8h    DVT prophylaxis: Lovenox Code Status: DNR Family Communication: I talked to patients niece - Veneda Melter, who is HCPOA. Disposition Plan:  ~ 1 - 2 days Consults called: None Admission status:  Obs Stepdown   Bethena Roys MD [facility]  07/09/2021, 11:12 PM

## 2021-07-09 NOTE — Progress Notes (Signed)
Pharmacy Antibiotic Note  [facility] a 87 y.o. female admitted on 07/09/2021 with sepsis.  Pharmacy has been consulted for vancomycin and cefepime dosing.  Plan: Vancomycin '500mg'$  IV every 24 hours.  Goal trough 15-20 mcg/mL. Cefepime 2gm IV every 24 hours.  Medical History: Past Medical History:  Diagnosis Date   Adenomatous colon polyp    carcinoma in situ   Chronic abdominal pain    Dementia (HCC)    Diabetes mellitus    Fatty liver    on [place] normal LFT'S   GERD (gastroesophageal reflux disease)    Hyperlipidemia    Hypertension    Iron deficiency    Chronic current parameter normal; 4/09 iron was 12 sat 5%   SDH (subdural hematoma) ([place]) 01/2014   chronic    Allergies:  Allergies  Allergen Reactions   Milk-Related Compounds Other (See Comments)    Lactose Intolerant    Iohexol Other (See Comments)     Desc: PT STATES THAT THE CONTRAST THAT SHE HAD IN 2004 MADE HER SICK, PT COULD NOT SPECIFY REACTION TYPES     Filed Weights   07/09/21 1620  Weight: 54.4 kg (119 lb 14.9 oz)    CBC Latest Ref Rng & Units 07/09/2021 06/20/2021 06/19/2021  WBC 4.0 - 10.5 K/uL 20.8(H) 10.7(H) 14.5(H)  Hemoglobin 12.0 - 15.0 g/dL 12.0 12.4 12.1  Hematocrit 36.0 - 46.0 % 38.6 39.4 35.8(L)  Platelets 150 - 400 K/uL 365 208 247     Estimated Creatinine Clearance: 29.5 mL/min (A) (by C-G formula based on SCr of 1.07 mg/dL (H)).  Antibiotics Given (last 72 hours)     None       Antimicrobials this admission:   Cefepime 9/4 >>  vancomycin 9/4 >>  Metronidazole 9/4  x 1   Microbiology results: 9/4 BCx: sent 9/4 UCx: sent 9/4 Resp Panel: sent  9/4 MRSA PCR: sent  Thank you for allowing pharmacy to be a part of this patient's care.  Thomasenia Sales, PharmD Clinical Pharmacist

## 2021-07-09 NOTE — Progress Notes (Signed)
Elink following for sepsis protocol. 

## 2021-07-10 ENCOUNTER — Inpatient Hospital Stay (HOSPITAL_COMMUNITY): Payer: Medicare Other

## 2021-07-10 ENCOUNTER — Other Ambulatory Visit: Payer: Self-pay

## 2021-07-10 DIAGNOSIS — I5021 Acute systolic (congestive) heart failure: Secondary | ICD-10-CM | POA: Diagnosis not present

## 2021-07-10 DIAGNOSIS — E119 Type 2 diabetes mellitus without complications: Secondary | ICD-10-CM | POA: Diagnosis not present

## 2021-07-10 DIAGNOSIS — Z66 Do not resuscitate: Secondary | ICD-10-CM | POA: Diagnosis present

## 2021-07-10 DIAGNOSIS — U071 COVID-19: Secondary | ICD-10-CM | POA: Diagnosis not present

## 2021-07-10 DIAGNOSIS — M4628 Osteomyelitis of vertebra, sacral and sacrococcygeal region: Secondary | ICD-10-CM

## 2021-07-10 DIAGNOSIS — M869 Osteomyelitis, unspecified: Secondary | ICD-10-CM | POA: Diagnosis not present

## 2021-07-10 DIAGNOSIS — E1165 Type 2 diabetes mellitus with hyperglycemia: Secondary | ICD-10-CM | POA: Diagnosis not present

## 2021-07-10 DIAGNOSIS — F0390 Unspecified dementia without behavioral disturbance: Secondary | ICD-10-CM | POA: Diagnosis present

## 2021-07-10 DIAGNOSIS — I82412 Acute embolism and thrombosis of left femoral vein: Secondary | ICD-10-CM | POA: Diagnosis present

## 2021-07-10 DIAGNOSIS — E785 Hyperlipidemia, unspecified: Secondary | ICD-10-CM | POA: Diagnosis present

## 2021-07-10 DIAGNOSIS — G9341 Metabolic encephalopathy: Secondary | ICD-10-CM | POA: Diagnosis present

## 2021-07-10 DIAGNOSIS — R1312 Dysphagia, oropharyngeal phase: Secondary | ICD-10-CM | POA: Diagnosis not present

## 2021-07-10 DIAGNOSIS — J1282 Pneumonia due to coronavirus disease 2019: Secondary | ICD-10-CM | POA: Diagnosis present

## 2021-07-10 DIAGNOSIS — T380X5A Adverse effect of glucocorticoids and synthetic analogues, initial encounter: Secondary | ICD-10-CM | POA: Diagnosis present

## 2021-07-10 DIAGNOSIS — G9340 Encephalopathy, unspecified: Secondary | ICD-10-CM | POA: Diagnosis not present

## 2021-07-10 DIAGNOSIS — R404 Transient alteration of awareness: Secondary | ICD-10-CM | POA: Diagnosis not present

## 2021-07-10 DIAGNOSIS — L89150 Pressure ulcer of sacral region, unstageable: Secondary | ICD-10-CM | POA: Diagnosis not present

## 2021-07-10 DIAGNOSIS — R279 Unspecified lack of coordination: Secondary | ICD-10-CM | POA: Diagnosis not present

## 2021-07-10 DIAGNOSIS — R7989 Other specified abnormal findings of blood chemistry: Secondary | ICD-10-CM | POA: Diagnosis not present

## 2021-07-10 DIAGNOSIS — R6520 Severe sepsis without septic shock: Secondary | ICD-10-CM | POA: Diagnosis not present

## 2021-07-10 DIAGNOSIS — K760 Fatty (change of) liver, not elsewhere classified: Secondary | ICD-10-CM | POA: Diagnosis present

## 2021-07-10 DIAGNOSIS — L03317 Cellulitis of buttock: Secondary | ICD-10-CM | POA: Diagnosis present

## 2021-07-10 DIAGNOSIS — L03319 Cellulitis of trunk, unspecified: Secondary | ICD-10-CM | POA: Diagnosis not present

## 2021-07-10 DIAGNOSIS — I82432 Acute embolism and thrombosis of left popliteal vein: Secondary | ICD-10-CM | POA: Diagnosis present

## 2021-07-10 DIAGNOSIS — I2781 Cor pulmonale (chronic): Secondary | ICD-10-CM | POA: Diagnosis present

## 2021-07-10 DIAGNOSIS — E1169 Type 2 diabetes mellitus with other specified complication: Secondary | ICD-10-CM | POA: Diagnosis present

## 2021-07-10 DIAGNOSIS — I2692 Saddle embolus of pulmonary artery without acute cor pulmonale: Secondary | ICD-10-CM | POA: Diagnosis not present

## 2021-07-10 DIAGNOSIS — R6889 Other general symptoms and signs: Secondary | ICD-10-CM | POA: Diagnosis not present

## 2021-07-10 DIAGNOSIS — J9601 Acute respiratory failure with hypoxia: Secondary | ICD-10-CM | POA: Diagnosis not present

## 2021-07-10 DIAGNOSIS — E860 Dehydration: Secondary | ICD-10-CM | POA: Diagnosis present

## 2021-07-10 DIAGNOSIS — A4189 Other specified sepsis: Secondary | ICD-10-CM | POA: Diagnosis present

## 2021-07-10 DIAGNOSIS — I82402 Acute embolism and thrombosis of unspecified deep veins of left lower extremity: Secondary | ICD-10-CM | POA: Diagnosis not present

## 2021-07-10 DIAGNOSIS — R2689 Other abnormalities of gait and mobility: Secondary | ICD-10-CM | POA: Diagnosis not present

## 2021-07-10 DIAGNOSIS — Z7401 Bed confinement status: Secondary | ICD-10-CM | POA: Diagnosis not present

## 2021-07-10 DIAGNOSIS — F0150 Vascular dementia without behavioral disturbance: Secondary | ICD-10-CM

## 2021-07-10 DIAGNOSIS — E872 Acidosis: Secondary | ICD-10-CM | POA: Diagnosis present

## 2021-07-10 DIAGNOSIS — I10 Essential (primary) hypertension: Secondary | ICD-10-CM

## 2021-07-10 DIAGNOSIS — A419 Sepsis, unspecified organism: Secondary | ICD-10-CM

## 2021-07-10 DIAGNOSIS — Z515 Encounter for palliative care: Secondary | ICD-10-CM | POA: Diagnosis not present

## 2021-07-10 DIAGNOSIS — R4182 Altered mental status, unspecified: Secondary | ICD-10-CM

## 2021-07-10 DIAGNOSIS — Z7189 Other specified counseling: Secondary | ICD-10-CM | POA: Diagnosis not present

## 2021-07-10 DIAGNOSIS — I2699 Other pulmonary embolism without acute cor pulmonale: Secondary | ICD-10-CM | POA: Diagnosis present

## 2021-07-10 DIAGNOSIS — R627 Adult failure to thrive: Secondary | ICD-10-CM | POA: Diagnosis not present

## 2021-07-10 DIAGNOSIS — D649 Anemia, unspecified: Secondary | ICD-10-CM | POA: Diagnosis not present

## 2021-07-10 DIAGNOSIS — M6281 Muscle weakness (generalized): Secondary | ICD-10-CM | POA: Diagnosis not present

## 2021-07-10 DIAGNOSIS — I82403 Acute embolism and thrombosis of unspecified deep veins of lower extremity, bilateral: Secondary | ICD-10-CM | POA: Diagnosis not present

## 2021-07-10 DIAGNOSIS — L89159 Pressure ulcer of sacral region, unspecified stage: Secondary | ICD-10-CM | POA: Diagnosis not present

## 2021-07-10 DIAGNOSIS — I2601 Septic pulmonary embolism with acute cor pulmonale: Secondary | ICD-10-CM | POA: Diagnosis not present

## 2021-07-10 DIAGNOSIS — J9811 Atelectasis: Secondary | ICD-10-CM | POA: Diagnosis not present

## 2021-07-10 LAB — COMPREHENSIVE METABOLIC PANEL
ALT: 17 U/L (ref 0–44)
AST: 24 U/L (ref 15–41)
Albumin: 2.2 g/dL — ABNORMAL LOW (ref 3.5–5.0)
Alkaline Phosphatase: 56 U/L (ref 38–126)
Anion gap: 6 (ref 5–15)
BUN: 20 mg/dL (ref 8–23)
CO2: 30 mmol/L (ref 22–32)
Calcium: 8.6 mg/dL — ABNORMAL LOW (ref 8.9–10.3)
Chloride: 107 mmol/L (ref 98–111)
Creatinine, Ser: 0.73 mg/dL (ref 0.44–1.00)
GFR, Estimated: >60 mL/min (ref >60)
Glucose, Bld: 141 mg/dL — ABNORMAL HIGH (ref 70–99)
Potassium: 3.5 mmol/L (ref 3.5–5.1)
Sodium: 143 mmol/L (ref 135–145)
Total Bilirubin: 0.5 mg/dL (ref 0.3–1.2)
Total Protein: 6 g/dL — ABNORMAL LOW (ref 6.5–8.1)

## 2021-07-10 LAB — FERRITIN
Ferritin: 705 ng/mL — ABNORMAL HIGH (ref 11–307)
Ferritin: 588 ng/mL — ABNORMAL HIGH (ref 11–307)

## 2021-07-10 LAB — CBC WITH DIFFERENTIAL/PLATELET
Abs Immature Granulocytes: 0.11 10*3/uL — ABNORMAL HIGH (ref 0.00–0.07)
Basophils Absolute: 0 10*3/uL (ref 0.0–0.1)
Basophils Relative: 0 %
Eosinophils Absolute: 0 10*3/uL (ref 0.0–0.5)
Eosinophils Relative: 0 %
HCT: 32 % — ABNORMAL LOW (ref 36.0–46.0)
Hemoglobin: 9.9 g/dL — ABNORMAL LOW (ref 12.0–15.0)
Immature Granulocytes: 1 %
Lymphocytes Relative: 10 %
Lymphs Abs: 1.6 10*3/uL (ref 0.7–4.0)
MCH: 28.2 pg (ref 26.0–34.0)
MCHC: 30.9 g/dL (ref 30.0–36.0)
MCV: 91.2 fL (ref 80.0–100.0)
Monocytes Absolute: 1.3 10*3/uL — ABNORMAL HIGH (ref 0.1–1.0)
Monocytes Relative: 8 %
Neutro Abs: 13.6 10*3/uL — ABNORMAL HIGH (ref 1.7–7.7)
Neutrophils Relative %: 81 %
Platelets: 276 10*3/uL (ref 150–400)
RBC: 3.51 MIL/uL — ABNORMAL LOW (ref 3.87–5.11)
RDW: 13.9 % (ref 11.5–15.5)
WBC: 16.6 10*3/uL — ABNORMAL HIGH (ref 4.0–10.5)
nRBC: 0 % (ref 0.0–0.2)

## 2021-07-10 LAB — BLOOD GAS, ARTERIAL
Acid-Base Excess: 5.4 mmol/L — ABNORMAL HIGH (ref 0.0–2.0)
Bicarbonate: 29.2 mmol/L — ABNORMAL HIGH (ref 20.0–28.0)
FIO2: 21
O2 Saturation: 96 %
Patient temperature: 37
pCO2 arterial: 42.1 mmHg (ref 32.0–48.0)
pH, Arterial: 7.456 — ABNORMAL HIGH (ref 7.350–7.450)
pO2, Arterial: 80.2 mmHg — ABNORMAL LOW (ref 83.0–108.0)

## 2021-07-10 LAB — GLUCOSE, CAPILLARY
Glucose-Capillary: 117 mg/dL — ABNORMAL HIGH (ref 70–99)
Glucose-Capillary: 119 mg/dL — ABNORMAL HIGH (ref 70–99)
Glucose-Capillary: 119 mg/dL — ABNORMAL HIGH (ref 70–99)
Glucose-Capillary: 131 mg/dL — ABNORMAL HIGH (ref 70–99)
Glucose-Capillary: 161 mg/dL — ABNORMAL HIGH (ref 70–99)
Glucose-Capillary: 175 mg/dL — ABNORMAL HIGH (ref 70–99)
Glucose-Capillary: 251 mg/dL — ABNORMAL HIGH (ref 70–99)

## 2021-07-10 LAB — LACTIC ACID, PLASMA
Lactic Acid, Venous: 2.3 mmol/L (ref 0.5–1.9)
Lactic Acid, Venous: 2.3 mmol/L (ref 0.5–1.9)
Lactic Acid, Venous: 3 mmol/L (ref 0.5–1.9)

## 2021-07-10 LAB — PHOSPHORUS: Phosphorus: 2.3 mg/dL — ABNORMAL LOW (ref 2.5–4.6)

## 2021-07-10 LAB — C-REACTIVE PROTEIN
CRP: 30.4 mg/dL — ABNORMAL HIGH (ref <1.0)
CRP: 22.9 mg/dL — ABNORMAL HIGH (ref <1.0)

## 2021-07-10 LAB — D-DIMER, QUANTITATIVE: D-Dimer, Quant: 4.52 ug/mL-FEU — ABNORMAL HIGH (ref 0.00–0.50)

## 2021-07-10 LAB — PROCALCITONIN: Procalcitonin: 0.85 ng/mL

## 2021-07-10 LAB — MAGNESIUM: Magnesium: 2.3 mg/dL (ref 1.7–2.4)

## 2021-07-10 LAB — MRSA NEXT GEN BY PCR, NASAL: MRSA by PCR Next Gen: NOT DETECTED

## 2021-07-10 MED ORDER — ASCORBIC ACID 500 MG PO TABS
500.0000 mg | ORAL_TABLET | Freq: Two times a day (BID) | ORAL | Status: DC
  Administered 2021-07-10 – 2021-07-20 (×20): 500 mg via ORAL
  Filled 2021-07-10 (×22): qty 1

## 2021-07-10 MED ORDER — PROSIGHT PO TABS
1.0000 | ORAL_TABLET | Freq: Every day | ORAL | Status: DC
  Filled 2021-07-10 (×3): qty 1

## 2021-07-10 MED ORDER — INSULIN ASPART 100 UNIT/ML IJ SOLN
0.0000 [IU] | INTRAMUSCULAR | Status: DC
  Administered 2021-07-10: 1 [IU] via SUBCUTANEOUS
  Administered 2021-07-10: 5 [IU] via SUBCUTANEOUS

## 2021-07-10 MED ORDER — SODIUM CHLORIDE 0.9 % IV SOLN: INTRAVENOUS | Status: DC

## 2021-07-10 MED ORDER — CIPROFLOXACIN IN D5W 400 MG/200ML IV SOLN
400.0000 mg | Freq: Three times a day (TID) | INTRAVENOUS | Status: DC
  Administered 2021-07-10 – 2021-07-11 (×3): 400 mg via INTRAVENOUS
  Filled 2021-07-10 (×3): qty 200

## 2021-07-10 MED ORDER — INSULIN ASPART 100 UNIT/ML IJ SOLN
0.0000 [IU] | INTRAMUSCULAR | Status: DC
Start: 2021-07-10 — End: 2021-07-14
  Administered 2021-07-10 – 2021-07-12 (×5): 3 [IU] via SUBCUTANEOUS
  Administered 2021-07-12: 2 [IU] via SUBCUTANEOUS
  Administered 2021-07-12: 3 [IU] via SUBCUTANEOUS
  Administered 2021-07-13: 2 [IU] via SUBCUTANEOUS
  Administered 2021-07-13: 5 [IU] via SUBCUTANEOUS
  Administered 2021-07-13 (×2): 2 [IU] via SUBCUTANEOUS
  Administered 2021-07-13: 5 [IU] via SUBCUTANEOUS
  Administered 2021-07-14: 8 [IU] via SUBCUTANEOUS

## 2021-07-10 MED ORDER — ALBUTEROL SULFATE HFA 108 (90 BASE) MCG/ACT IN AERS: 2.0000 | INHALATION_SPRAY | RESPIRATORY_TRACT | Status: DC

## 2021-07-10 MED ORDER — OCUVITE-LUTEIN PO CAPS
1.0000 | ORAL_CAPSULE | Freq: Every day | ORAL | Status: DC
  Administered 2021-07-10 – 2021-07-20 (×11): 1 via ORAL
  Filled 2021-07-10 (×11): qty 1

## 2021-07-10 MED ORDER — IPRATROPIUM-ALBUTEROL 0.5-2.5 (3) MG/3ML IN SOLN: 3.0000 mL | Freq: Four times a day (QID) | RESPIRATORY_TRACT | Status: DC

## 2021-07-10 MED ORDER — VANCOMYCIN HCL 500 MG/100ML IV SOLN
500.0000 mg | INTRAVENOUS | Status: DC
  Administered 2021-07-10 – 2021-07-12 (×2): 500 mg via INTRAVENOUS
  Filled 2021-07-10 (×2): qty 100

## 2021-07-10 MED ORDER — COLLAGENASE 250 UNIT/GM EX OINT
TOPICAL_OINTMENT | Freq: Every day | CUTANEOUS | Status: DC
  Filled 2021-07-10: qty 30

## 2021-07-10 MED ORDER — ALBUTEROL SULFATE HFA 108 (90 BASE) MCG/ACT IN AERS
4.0000 | INHALATION_SPRAY | RESPIRATORY_TRACT | Status: DC
  Administered 2021-07-10: 4 via RESPIRATORY_TRACT

## 2021-07-10 MED ORDER — BOOST / RESOURCE BREEZE PO LIQD CUSTOM
1.0000 | Freq: Three times a day (TID) | ORAL | Status: DC
  Administered 2021-07-10 – 2021-07-17 (×17): 1 via ORAL

## 2021-07-10 MED ORDER — SODIUM CHLORIDE 0.9 % IV SOLN
100.0000 mg | INTRAVENOUS | Status: AC
Start: 2021-07-10 — End: 2021-07-10
  Administered 2021-07-10 (×2): 100 mg via INTRAVENOUS
  Filled 2021-07-10 (×2): qty 100

## 2021-07-10 MED ORDER — ZINC SULFATE 220 (50 ZN) MG PO CAPS
220.0000 mg | ORAL_CAPSULE | Freq: Every day | ORAL | Status: DC
  Administered 2021-07-10 – 2021-07-20 (×11): 220 mg via ORAL
  Filled 2021-07-10 (×11): qty 1

## 2021-07-10 MED ORDER — ENSURE ENLIVE PO LIQD: 237.0000 mL | Freq: Two times a day (BID) | ORAL | Status: DC

## 2021-07-10 MED ORDER — ALBUTEROL SULFATE HFA 108 (90 BASE) MCG/ACT IN AERS: 4.0000 | INHALATION_SPRAY | Freq: Four times a day (QID) | RESPIRATORY_TRACT | Status: DC | PRN

## 2021-07-10 MED ORDER — SODIUM CHLORIDE 0.9 % IV SOLN
100.0000 mg | Freq: Every day | INTRAVENOUS | Status: AC
  Administered 2021-07-11 – 2021-07-14 (×4): 100 mg via INTRAVENOUS
  Filled 2021-07-10: qty 100
  Filled 2021-07-10: qty 20
  Filled 2021-07-10 (×3): qty 100

## 2021-07-10 MED ORDER — CHLORHEXIDINE GLUCONATE CLOTH 2 % EX PADS
6.0000 | MEDICATED_PAD | Freq: Every day | CUTANEOUS | Status: DC
  Administered 2021-07-10 – 2021-07-11 (×2): 6 via TOPICAL

## 2021-07-10 MED ORDER — DEXTROSE-NACL 5-0.9 % IV SOLN: INTRAVENOUS | Status: DC

## 2021-07-10 MED ORDER — ALBUTEROL SULFATE HFA 108 (90 BASE) MCG/ACT IN AERS
4.0000 | INHALATION_SPRAY | Freq: Four times a day (QID) | RESPIRATORY_TRACT | Status: DC
  Administered 2021-07-10 (×2): 4 via RESPIRATORY_TRACT

## 2021-07-10 MED ORDER — METHYLPREDNISOLONE SODIUM SUCC 125 MG IJ SOLR
60.0000 mg | Freq: Two times a day (BID) | INTRAMUSCULAR | Status: DC
  Administered 2021-07-10 – 2021-07-14 (×9): 60 mg via INTRAVENOUS
  Filled 2021-07-10 (×9): qty 2

## 2021-07-10 NOTE — Progress Notes (Signed)
Initial Nutrition Assessment  DOCUMENTATION CODES:      INTERVENTION:  Ensure Enlive po BID, each supplement provides 350 kcal and 20 grams of protein   Assist with feeding all meals   Magic cup TID with meals, each supplement provides 290 kcal and 9 grams of protein   NUTRITION DIAGNOSIS:   Increased nutrient needs related to wound healing as evidenced by estimated needs.   GOAL:  Patient will meet greater than or equal to 90% of their needs  MONITOR:  PO intake, Supplement acceptance, Labs, Skin, Weight trends  REASON FOR ASSESSMENT:   Malnutrition Screening Tool    ASSESSMENT: Patient is a 87 yo female from [facility]. Hx of DM, GERD, dementia, HTN, HLD. Hospitalized last month with altered mental status, sepsis.   Presents from [facility] with altered mental status.   ST bedside swallow evaluation and patient receiving purred foods/thin liquids. Fed by staff.   Patient still eating poorly. Patient has been pocketing foods per family report last admission and requires assistance with feeding. Patient unable to provide nutrition history. Talked with nursing- patient ate 45% of lunch today.  Weight history: severe loss 22% (11.8 kg) compared to 3 weeks ago.  07/10/21 42.6 kg 06/18/21- 54.4 kg  Medications reviewed and include: Colace, Aricept, Insulin, Namenda, KCL and oxybutynin.   Labs:  BMP Latest Ref Rng & Units 07/10/2021 07/09/2021 06/20/2021  Glucose 70 - 99 mg/dL 141(H) 254(H) 88  BUN 8 - 23 mg/dL 20 29(H) 15  Creatinine 0.44 - 1.00 mg/dL 0.73 1.07(H) 0.64  Sodium 135 - 145 mmol/L 143 143 140  Potassium 3.5 - 5.1 mmol/L 3.5 3.8 3.2(L)  Chloride 98 - 111 mmol/L 107 109 103  CO2 22 - 32 mmol/L '30 27 28  '$ Calcium 8.9 - 10.3 mg/dL 8.6(L) 8.7(L) 8.6(L)      NUTRITION - FOCUSED PHYSICAL EXAM: Partial Nutrition-Focused physical exam due to pt inability to participate.Findings are mild orbital fat depletion, mild temporal muscle depletion, and  moderate BLE edema.    Diet Order:   Diet Order             DIET - DYS 1 Room service appropriate? Yes; Fluid consistency: Thin  Diet effective now                   EDUCATION NEEDS:  Not appropriate for education at this time  Skin:  Skin Assessment: Skin Integrity Issues: Skin Integrity Issues:: Unstageable Unstageable: coccyx  Last BM:  PTA  Height:   Ht Readings from Last 1 Encounters:  07/09/21 '5\' 1"'$  (1.549 m)    Weight:   Wt Readings from Last 1 Encounters:  07/09/21 42.6 kg    Ideal Body Weight:   48 kg  BMI:  Body mass index is 17.75 kg/m.  Estimated Nutritional Needs:   Kcal:  1500-1600  Protein:  70-77 gr  Fluid:  >1200 ml daily   Colman Cater MS,RD,CSG,LDN Contact: Shea Evans

## 2021-07-10 NOTE — Progress Notes (Signed)
RT attempted both flutter and IS, patient unable to follow commands adequately.

## 2021-07-10 NOTE — Plan of Care (Signed)
Care Plan reviewed 

## 2021-07-10 NOTE — Progress Notes (Signed)
Pharmacy Antibiotic Note  [facility] a 87 y.o. female admitted on 07/10/2021 with osteomyelitis suspected.  Pharmacy has been consulted for vancomycin and ciprofloxacin dosing.  Plan: Vancomycin '500mg'$  IV every 24 hours.  Goal trough 15-20 mcg/mL. Ciprofloxacin '400mg'$  iv q8h  Medical History: Past Medical History:  Diagnosis Date   Adenomatous colon polyp    carcinoma in situ   AMS (altered mental status) 07/09/2021   Chronic abdominal pain    Dementia (HCC)    Diabetes mellitus    Fatty liver    on [place] normal LFT'S   GERD (gastroesophageal reflux disease)    Hyperlipidemia    Hypertension    Iron deficiency    Chronic current parameter normal; 4/09 iron was 12 sat 5%   SDH (subdural hematoma) ([place]) 01/2014   chronic    Allergies:  Allergies  Allergen Reactions   Milk-Related Compounds Other (See Comments)    Lactose Intolerant    Iohexol Other (See Comments)     Desc: PT STATES THAT THE CONTRAST THAT SHE HAD IN 2004 MADE HER SICK, PT COULD NOT SPECIFY REACTION TYPES     Filed Weights   07/09/21 1620 07/09/21 2249  Weight: 54.4 kg (119 lb 14.9 oz) 42.6 kg (93 lb 14.7 oz)    CBC Latest Ref Rng & Units 07/10/2021 07/09/2021 06/20/2021  WBC 4.0 - 10.5 K/uL 16.6(H) 20.8(H) 10.7(H)  Hemoglobin 12.0 - 15.0 g/dL 9.9(L) 12.0 12.4  Hematocrit 36.0 - 46.0 % 32.0(L) 38.6 39.4  Platelets 150 - 400 K/uL 276 365 208     Estimated Creatinine Clearance: 35.2 mL/min (by C-G formula based on SCr of 0.73 mg/dL).  Antibiotics Given (last 72 hours)     Date/Time Action Medication Dose Rate   07/09/21 1817 New Bag/Given   ceFEPIme (MAXIPIME) 2 g in sodium chloride 0.9 % 100 mL IVPB 2 g 200 mL/hr   07/09/21 1820 New Bag/Given   metroNIDAZOLE (FLAGYL) IVPB 500 mg 500 mg 100 mL/hr   07/09/21 1852 New Bag/Given   vancomycin (VANCOCIN) IVPB 1000 mg/200 mL premix 1,000 mg 200 mL/hr   07/10/21 1018 New Bag/Given   remdesivir 100 mg in sodium chloride 0.9 % 100 mL IVPB 100 mg 200 mL/hr        Antimicrobials this admission:   Cefepime 9/4 >> 9/5 Ciprofloxacin 9/5 >>  vancomycin 9/4 >>  Metronidazole 9/4  x 1   Microbiology results: 9/4 BCx: sent 9/4 UCx: sent 9/4 Resp Panel: covid positive  9/4 MRSA PCR: negative   Thank you for allowing pharmacy to be a part of this patient's care.  Thomasenia Sales, PharmD Clinical Pharmacist

## 2021-07-10 NOTE — Consult Note (Signed)
[facility] Nurse wound consult note Consultation completed by use of remote telehealth camera cart/Elink technology and with assistance from bedside nurse/clinical staff  Reason for Consult: pressure injury  Wound type: Unstageable Pressure Injury Pressure Injury POA: Yes Measurement:9cm x 6cm  0.1cm  Wound bed: 40% soft black; 60% pink Drainage (amount, consistency, odor) moderate, yellow, with odor (per bedside nursing in the room)  Periwound: intact  Dressing procedure/placement/frequency:  Add enzymatic debridement ointment daily, top with saline  moist gauze, top with dry dressings. Secure with tape. Change daily  On LALM while in the ICU for moisture management and pressure redistribution.  Added orders for same if she transfers to the floor  Add dietician for nutritional supplementation for wound healing.   Discussed POC with\bedside nurse.  Re consult if needed, will not follow at this time. Thanks  Krish Bailly [employer], RN,CWOCN, CNS, [place] [phone])

## 2021-07-10 NOTE — Progress Notes (Signed)
PROGRESS NOTE    Taylor Andrews  HKV:[id number] DOB: [birth year] DOA: 07/09/2021 PCP: Redmond School, MD   Brief Narrative:   Taylor Andrews is a 87 y.o. BF PMHx dementia,, hypertension, DM type II controlled, fatty liver disease,  subdural hematoma 2015.   Presented ED from nursing home with reports of altered mental status and cough.  At the time of my evaluation, patient is somnolent, not answering questions.  Patient was brought to the ED with reports of cough, change in mental status.   I am unable to reach nursing home.  I talked to patient's niece who is power of attorney- [name], she reports patient has been sick since last Sunday-at least 1 week.  Has been weak, not eating, not wanting to get up or walk.  At baseline, patient can feed her self, occasionally recognizes family, but cannot answer questions.  Today patient started coughing, so she was brought to the ED.   Hospitalization 8/14-8/16 for dehydration and possible sepsis with increased procalcitonin, patient was started on Vanco and cefepime, brain MRI was unremarkable.  Cultures were unremarkable.  Patient was discharged home on a course of antibiotics.   ED Course: Temperature 99.1.  Heart rate 70s to 112 initially ED.  Respiratory rate 17-22.  Blood pressure 99-140.   O2 sats on room air, 100%.  Head CT and chest x-ray without acute abnormality.  Lactic acidosis 4.7 > 2.4.  UA unremarkable.  Troponin 14 > 15.  WBC 20.8.   Blood cultures obtained.  Broad-spectrum antibiotics Vanco cefepime and metronidazole ordered.  COVID test later resulted positive.  Hospitalist to admit.   Subjective: Patient's eyes are open, however A/O x0.  Does not follow commands.   Assessment & Plan:  Covid vaccination; vaccinated 2/4 per niece  Principal Problem:   Acute encephalopathy Active Problems:   Dementia ([place])   HTN (hypertension)   DM (diabetes mellitus), type 2 ([place])   COVID-19 virus infection   Severe sepsis ([place])   AMS (altered  mental status)   Decubitus ulcer of sacral region, unstageable ([place])   Sacral osteomyelitis ([place])   Cellulitis of sacral region  Severe sepsis - On admission patient met criteria for severe sepsis WBC> 12 K, HR> 90, site of infection COVID 19 infection, sacral decubitus ulcer/cellulitis (osteomyelitis?)  Lactic acid> 2, 9/5 normal saline 117m/hr  COVID-19 positive Infection -On admission CXR unremarkable however at some point during admission patient was on   COVID-19 Labs  Recent Labs    07/09/21 1751 07/09/21 1801 07/10/21 0401  DDIMER  --  6.67* 4.52*  FERRITIN 705*  --  588*  LDH  --  248*  --   CRP 30.4*  --  22.9*  Results for RLAMARA, BRECHT(MRN [id number] as of 07/10/2021 07:48  Ref. Range 07/09/2021 18:01 07/10/2021 04:01  Procalcitonin Latest Units: ng/mL 1.17 0.85   Results for RJOSCELYNE, RENVILLE(MRN [id number] as of 07/10/2021 10:16  Ref. Range 07/09/2021 17:50 07/10/2021 09:03 07/10/2021 11:16  Lactic Acid, Venous Latest Ref Range: 0.5 - 1.9 mmol/L 2.4 (HH) 2.3 (HH) 2.3 (HH)     Lab Results  Component Value Date   SARSCOV2NAA POSITIVE (A) 07/09/2021   SM[place] OliverNEGATIVE 06/18/2021  -9/5 swallow study  - 9/5 Remdesivir per pharmacy protocol -9/5 Solu-Medrol 60 mg BID -9/5 DuoNeb QID - 9/5 vitamins per COVID protocol - 9/5 incentive spirometry - 9/5 flutter valve  Unstageable sacral decubitus ulcer/osteomyelitis? Pressure Injury 07/10/21 Coccyx Medial Unstageable - Full thickness tissue loss  in which the base of the injury is covered by slough (yellow, tan, gray, green or brown) and/or eschar (tan, brown or black) in the wound bed. (Active)  07/10/21 0000  Location: Coccyx  Location Orientation: Medial  Staging: Unstageable - Full thickness tissue loss in which the base of the injury is covered by slough (yellow, tan, gray, green or brown) and/or eschar (tan, brown or black) in the wound bed.  Wound Description (Comments):   Present on Admission: Yes  -9/5 DG LS  pending -9/5 discussed case with RP H GERD we will start vancomycin + ciprofloxacin  Acute metabolic encephalopathy/Baseline Dementia.   -Multifactorial sacral decubitus ulcer/cellulitis, COVID-19 infection, dehydration. -Per admission note At baseline, patient can feed her self, occasionally recognizes family, but cannot answer questions. - Currently unable to feed herself or follow commands.  Hypertension- -Patient with severe sepsis hold all BP medication -Monitor closely and will add medication back as required.  DM type II controlled -8/15 hemoglobin A1c= 5.2 - Moderate SSI    DVT prophylaxis: Lovenox Code Status: DNR Family Communication:  Status is: Inpatient    Dispo: The patient is from: nursing home              Anticipated d/c is to: nursing home              Anticipated d/c date is: > 3 days              Patient currently is not medically stable to d/c.      Consultants:    Procedures/Significant Events:     I have personally reviewed and interpreted all radiology studies and my findings are as above.  VENTILATOR SETTINGS: Room air 9/5 SPO2 100%    Cultures 9/4 SARS coronavirus positive  Antimicrobials: Anti-infectives (From admission, onward)    Start     Ordered Stop   07/11/21 1000  remdesivir 100 mg in sodium chloride 0.9 % 100 mL IVPB        07/10/21 0832 07/15/21 0959   07/10/21 1800  vancomycin (VANCOREADY) IVPB 500 mg/100 mL  Status:  Discontinued        07/09/21 1754 07/09/21 2308   07/10/21 1700  vancomycin (VANCOREADY) IVPB 500 mg/100 mL        07/10/21 1021     07/10/21 1600  ceFEPIme (MAXIPIME) 2 g in sodium chloride 0.9 % 100 mL IVPB  Status:  Discontinued        07/09/21 1754 07/09/21 2308   07/10/21 1115  ciprofloxacin (CIPRO) IVPB 400 mg        07/10/21 1021     07/10/21 0930  remdesivir 100 mg in sodium chloride 0.9 % 100 mL IVPB        07/10/21 0832 07/10/21 1129   07/09/21 1745  ceFEPIme (MAXIPIME) 2 g in sodium  chloride 0.9 % 100 mL IVPB        07/09/21 1737 07/09/21 1850   07/09/21 1745  metroNIDAZOLE (FLAGYL) IVPB 500 mg        07/09/21 1737 07/09/21 1922   07/09/21 1745  vancomycin (VANCOCIN) IVPB 1000 mg/200 mL premix        07/09/21 1737 07/09/21 1945         Devices    LINES / TUBES:      Continuous Infusions:  sodium chloride     ciprofloxacin 400 mg (07/10/21 1339)   lactated ringers     [START ON 07/11/2021] remdesivir 100 mg in NS 100 mL  vancomycin 500 mg (07/10/21 1701)     Objective: Vitals:   07/10/21 1300 07/10/21 1457 07/10/21 1500 07/10/21 1641  BP: (!) 124/53  (!) 151/60 104/79  Pulse: 81  92   Resp:   (!) 21   Temp:      TempSrc:      SpO2:  100% 100%   Weight:      Height:        Intake/Output Summary (Last 24 hours) at 07/10/2021 1735 Last data filed at 07/10/2021 1729 Gross per 24 hour  Intake 624.03 ml  Output 1350 ml  Net -725.97 ml   Filed Weights   07/09/21 1620 07/09/21 2249  Weight: 54.4 kg 42.6 kg    Examination:  General: A/O x0 (eyes open), does not respond to commands, No acute respiratory distress, cachectic Eyes: negative scleral hemorrhage, negative anisocoria, negative icterus ENT: Negative Runny nose, negative gingival bleeding, Neck:  Negative scars, masses, torticollis, lymphadenopathy, JVD Lungs: Clear to auscultation bilaterally without wheezes or crackles Cardiovascular: Regular rate and rhythm without murmur gallop or rub normal S1 and S2 Abdomen: negative abdominal pain, nondistended, positive soft, bowel sounds, no rebound, no ascites, no appreciable mass Extremities: No significant cyanosis, clubbing, or edema bilateral lower extremities Skin: Legible sacral decubitus ulcer appears may track all the way to the sacral bone. Psychiatric: Unable to assess Central nervous system: Patient eyes are open tracks you.  Does not follow commands unable to assess further  .     Data Reviewed: Care during the described time  interval was provided by me .  I have reviewed this patient's available data, including medical history, events of note, physical examination, and all test results as part of my evaluation.   CBC: Recent Labs  Lab 07/09/21 1628 07/10/21 0401  WBC 20.8* 16.6*  NEUTROABS 18.7* 13.6*  HGB 12.0 9.9*  HCT 38.6 32.0*  MCV 91.9 91.2  PLT 365 062   Basic Metabolic Panel: Recent Labs  Lab 07/09/21 1628 07/10/21 0401  NA 143 143  K 3.8 3.5  CL 109 107  CO2 27 30  GLUCOSE 254* 141*  BUN 29* 20  CREATININE 1.07* 0.73  CALCIUM 8.7* 8.6*  MG  --  2.3  PHOS  --  2.3*   GFR: Estimated Creatinine Clearance: 35.2 mL/min (by C-G formula based on SCr of 0.73 mg/dL). Liver Function Tests: Recent Labs  Lab 07/09/21 1628 07/10/21 0401  AST 30 24  ALT 20 17  ALKPHOS 74 56  BILITOT 0.5 0.5  PROT 7.7 6.0*  ALBUMIN 2.8* 2.2*   No results for input(s): LIPASE, AMYLASE in the last 168 hours. No results for input(s): AMMONIA in the last 168 hours. Coagulation Profile: No results for input(s): INR, PROTIME in the last 168 hours. Cardiac Enzymes: No results for input(s): CKTOTAL, CKMB, CKMBINDEX, TROPONINI in the last 168 hours. BNP (last 3 results) No results for input(s): PROBNP in the last 8760 hours. HbA1C: No results for input(s): HGBA1C in the last 72 hours. CBG: Recent Labs  Lab 07/10/21 0017 07/10/21 0636 07/10/21 0845 07/10/21 1145 07/10/21 1631  GLUCAP 119* 119* 131* 117* 251*   Lipid Profile: No results for input(s): CHOL, HDL, LDLCALC, TRIG, CHOLHDL, LDLDIRECT in the last 72 hours. Thyroid Function Tests: No results for input(s): TSH, T4TOTAL, FREET4, T3FREE, THYROIDAB in the last 72 hours. Anemia Panel: Recent Labs    07/09/21 1751 07/10/21 0401  FERRITIN 705* 588*   Urine analysis:    Component Value Date/Time  COLORURINE YELLOW 07/09/2021 1748   APPEARANCEUR HAZY (A) 07/09/2021 1748   LABSPEC 1.025 07/09/2021 1748   PHURINE 5.5 07/09/2021 [place] 07/09/2021 1748   HGBUR NEGATIVE 07/09/2021 1748   [place] 07/09/2021 1748   KETONESUR NEGATIVE 07/09/2021 1748   PROTEINUR TRACE (A) 07/09/2021 1748   UROBILINOGEN 0.2 07/13/2015 1024   NITRITE NEGATIVE 07/09/2021 1748   LEUKOCYTESUR NEGATIVE 07/09/2021 1748   Sepsis Labs: '@LABRCNTIP' (procalcitonin:4,lacticidven:4)  ) Recent Results (from the past 240 hour(s))  Resp Panel by RT-PCR (Flu A&B, Covid) Nasopharyngeal Swab     Status: Abnormal   Collection Time: 07/09/21  5:41 PM   Specimen: Nasopharyngeal Swab; Nasopharyngeal(NP) swabs in vial transport medium  Result Value Ref Range Status   SARS Coronavirus 2 by RT PCR POSITIVE (A) NEGATIVE Final    Comment: CRITICAL RESULT CALLED TO, READ BACK BY AND VERIFIED WITH: [place] 07/09/2021 [place],R (NOTE) SARS-CoV-2 target nucleic acids are DETECTED.  The SARS-CoV-2 RNA is generally detectable in upper respiratory specimens during the acute phase of infection. Positive results are indicative of the presence of the identified virus, but do not rule out bacterial infection or co-infection with other pathogens not detected by the test. Clinical correlation with patient history and other diagnostic information is necessary to determine patient infection status. The expected result is Negative.  Fact Sheet for Patients: EntrepreneurPulse.com.au  Fact Sheet for Healthcare Providers: IncredibleEmployment.be  This test is not yet approved or cleared by the [place] FDA and  has been authorized for detection and/or diagnosis of SARS-CoV-2 by FDA under an Emergency Use Authorization (EUA).  This EUA will remain in effect (meaning this t est can be used) for the duration of  the COVID-19 declaration under Section 564(b)(1) of the Act, 21 U.S.C. section 360bbb-3(b)(1), unless the authorization is terminated or revoked sooner.     Influenza A by PCR NEGATIVE  NEGATIVE Final   Influenza B by PCR NEGATIVE NEGATIVE Final    Comment: (NOTE) The Xpert Xpress SARS-CoV-2/FLU/RSV plus assay is intended as an aid in the diagnosis of influenza from Nasopharyngeal swab specimens and should not be used as a sole basis for treatment. Nasal washings and aspirates are unacceptable for Xpert Xpress SARS-CoV-2/FLU/RSV testing.  Fact Sheet for Patients: EntrepreneurPulse.com.au  Fact Sheet for Healthcare Providers: IncredibleEmployment.be  This test is not yet approved or cleared by the [place] FDA and has been authorized for detection and/or diagnosis of SARS-CoV-2 by FDA under an Emergency Use Authorization (EUA). This EUA will remain in effect (meaning this test can be used) for the duration of the COVID-19 declaration under Section 564(b)(1) of the Act, 21 U.S.C. section 360bbb-3(b)(1), unless the authorization is terminated or revoked.  Performed at [facility], [address], [place], [place] [address]   Culture, blood (routine x 2)     Status: None (Preliminary result)   Collection Time: 07/09/21  5:50 PM   Specimen: Right Antecubital; Blood  Result Value Ref Range Status   Specimen Description RIGHT ANTECUBITAL  Final   Special Requests   Final    BOTTLES DRAWN AEROBIC AND ANAEROBIC Blood Culture adequate volume   Culture   Final    NO GROWTH < 24 HOURS Performed at [facility], [address], [place], [place] [address]    Report Status PENDING  Incomplete  Culture, blood (routine x 2)     Status: None (Preliminary result)   Collection Time: 07/09/21  6:14 PM   Specimen: BLOOD RIGHT  ARM  Result Value Ref Range Status   Specimen Description BLOOD RIGHT ARM  Final   Special Requests   Final    BOTTLES DRAWN AEROBIC ONLY Blood Culture adequate volume   Culture   Final    NO GROWTH < 24 HOURS Performed at [facility], [address], [place], [place] [address]    Report Status PENDING   Incomplete  MRSA Next Gen by PCR, Nasal     Status: None   Collection Time: 07/09/21 10:44 PM   Specimen: Nasal Mucosa; Nasal Swab  Result Value Ref Range Status   MRSA by PCR Next Gen NOT DETECTED NOT DETECTED Final    Comment: (NOTE) The GeneXpert MRSA Assay (FDA approved for NASAL specimens only), is one component of a comprehensive MRSA colonization surveillance program. It is not intended to diagnose MRSA infection nor to guide or monitor treatment for MRSA infections. Test performance is not FDA approved in patients less than 67 years old. Performed at [facility], [address], [place], [place] [address]          Radiology Studies: DG Sacrum/Coccyx  Result Date: 07/10/2021 CLINICAL DATA:  Pressure ulcer at sacrum with yellowish discharge, dimension EXAM: SACRUM AND COCCYX - 2+ VIEW COMPARISON:  04/05/2009 FINDINGS: SI joints and sacral foramina preserved. Diffuse osseous demineralization. Dressing artifacts dorsal to sacrum. No sacrococcygeal fracture or definite bone destruction identified though the dorsal margin of the distal sacrum is very near the skin surface on lateral view. IMPRESSION: No definite bone destruction is seen to suggest sacrococcygeal osteomyelitis. Electronically Signed   By: Lavonia Dana M.D.   On: 07/10/2021 13:43   CT Head Wo Contrast  Result Date: 07/09/2021 CLINICAL DATA:  Mental status changes, history diabetes mellitus, hypertension, dementia; prior history of subdural hematoma and craniotomy EXAM: CT HEAD WITHOUT CONTRAST TECHNIQUE: Contiguous axial images were obtained from the base of the skull through the vertex without intravenous contrast. Sagittal and coronal MPR images reconstructed from axial data set. COMPARISON:  06/18/2021 FINDINGS: Brain: Generalized atrophy. Normal ventricular morphology. No midline shift or mass effect. Small vessel chronic ischemic changes of deep cerebral white matter. No intracranial hemorrhage, mass lesion, or evidence  of acute infarction. Vascular: No hyperdense vessels Skull: Prior RIGHT frontal craniotomy.  Skull otherwise intact. Sinuses/Orbits: Air-fluid levels and mucus within LEFT maxillary and RIGHT sphenoid sinuses. Remaining paranasal sinuses mastoid air cells clear. Other: N/A IMPRESSION: Atrophy with small vessel chronic ischemic changes of deep cerebral white matter. No acute intracranial abnormalities. Prior RIGHT frontal craniotomy. Sphenoid and LEFT maxillary sinus disease. Electronically Signed   By: Lavonia Dana M.D.   On: 07/09/2021 17:25   DG Chest Port 1 View  Result Date: 07/09/2021 CLINICAL DATA:  Cough and weakness.  Altered mental status. EXAM: PORTABLE CHEST 1 VIEW COMPARISON:  06/18/2021 FINDINGS: The cardiomediastinal contours are normal. Atherosclerosis of the aorta. Pulmonary vasculature is normal. No consolidation, pleural effusion, or pneumothorax. Diffuse bony under mineralization. No acute osseous abnormalities are seen. IMPRESSION: No acute chest findings. Electronically Signed   By: Keith Rake M.D.   On: 07/09/2021 17:47        Scheduled Meds:  albuterol  4 puff Inhalation Q6H   vitamin C  500 mg Oral BID   Chlorhexidine Gluconate Cloth  6 each Topical Daily   collagenase   Topical Daily   enoxaparin (LOVENOX) injection  30 mg Subcutaneous Q24H   feeding supplement  1 Container Oral TID BM   insulin aspart  0-15 Units Subcutaneous Q4H   methylPREDNISolone (SOLU-MEDROL) injection  60 mg Intravenous Q12H   multivitamin-lutein  1 capsule Oral Daily   zinc sulfate  220 mg Oral Daily   Continuous Infusions:  sodium chloride     ciprofloxacin 400 mg (07/10/21 1339)   lactated ringers     [START ON 07/11/2021] remdesivir 100 mg in NS 100 mL     vancomycin 500 mg (07/10/21 1701)     LOS: 0 days   The patient is critically ill with multiple organ systems failure and requires high complexity decision making for assessment and support, frequent evaluation and titration of  therapies, application of advanced monitoring technologies and extensive interpretation of multiple databases. Critical Care Time devoted to patient care services described in this note  Time spent: 40 minutes     Cailan Antonucci, Geraldo Docker, MD [facility]   If 7PM-7AM, please contact night-coverage 07/10/2021, 5:35 PM

## 2021-07-10 NOTE — Evaluation (Signed)
Clinical/Bedside Swallow Evaluation Patient Details  Name: Taylor Andrews MRN: [id number] Date of Birth: 10/02/1934  Today's Date: 07/10/2021 Time: SLP Start Time (ACUTE ONLY): 69 SLP Stop Time (ACUTE ONLY): 1045 SLP Time Calculation (min) (ACUTE ONLY): 30 min  Past Medical History:  Past Medical History:  Diagnosis Date   Adenomatous colon polyp    carcinoma in situ   AMS (altered mental status) 07/09/2021   Chronic abdominal pain    Dementia ([place])    Diabetes mellitus    Fatty liver    on [place] normal LFT'S   GERD (gastroesophageal reflux disease)    Hyperlipidemia    Hypertension    Iron deficiency    Chronic current parameter normal; 4/09 iron was 12 sat 5%   SDH (subdural hematoma) ([place]) 01/2014   chronic   Past Surgical History:  Past Surgical History:  Procedure Laterality Date   ABDOMINAL HYSTERECTOMY     ANKLE SURGERY     Left ;pins   COLONOSCOPY  8/05   internal hemorrhoids,polyp bengin   COLONOSCOPY  01/12/2011   [id number] rectum/ 5-mm polyp at the splenic flexure s/p polypectomy, tattooing, and scarred hepatic flexure, absolutely no evidence of any residual polyp tissue or neoplasm/Scattered left-sided diverticula. Tubular adenoma, no high-grade dysplasia.Repeat TCS due 01/2016   COLONOSCOPY  06/16/2009   [id number] hemorrhoids, otherwise normal rectum.Flat polypoid lesion at the hepatic flexure, debulked with piecemeal snare polypectomy.  This area was also tattooed Transverse colon and descending colon  flat adenomatous appearing polyps resected with hot snare cautery as well. Tubulovillous adenomas   COLONOSCOPY  December 2010   Dr. Gala Romney: Flat sessile hepatic flexure polyp status post saline-assisted piecemeal polypectomy. Fragments of adenomatous polyp with high-grade dysplasia.   COLONOSCOPY  March 2011   Dr. Gala Romney: Minimal persisting polypoid tissue at site of previous piecemeal polypectomy status post hot snare polypectomy. Tubular adenoma, no high-grade  dysplasia   CRANIOTOMY Right 01/23/2014   Procedure: CRANIOTOMY HEMATOMA EVACUATION SUBDURAL;  Surgeon: Eustace Moore, MD;  Location: [facility];  Service: Neurosurgery;  Laterality: Right;   ESOPHAGOGASTRODUODENOSCOPY  6/05   tiny antral erosin with some deformity of pyloric channel   VESICOVAGINAL FISTULA CLOSURE W/ TAH     HPI:  Taylor Andrews is a 87 y.o. BF PMHx dementia,, hypertension, DM type II controlled, fatty liver disease,  subdural hematoma 2015. Presented ED from nursing home with reports of altered mental status and cough.  At the time of my evaluation, patient is somnolent, not answering questions.  Patient was brought to the ED with reports of cough, change in mental status. Hospitalization 8/14-8/16 for dehydration and possible sepsis with increased procalcitonin, patient was started on Vanco and cefepime, brain MRI was unremarkable. COVID test later resulted positive. BSE requested.   Assessment / Plan / Recommendation Clinical Impression  Clinical swallow evaluation completed this date. Pt was alert, but kept her eyes closed throughout the visit. She allowed oral care (white small chunks removed) and responded to ice chips and tactile stimulation of cup, straw, and spoon. No vocalizations, swallow, or cough elicited upon SLP verbal request. Pt consumed ice chips, thin water via spoon, cup, straw with verbal and tactile cues, and puree textures without signs of aspiration. Pt had difficulty sucking from the giant, long straw, but was able to use once cut and occluded some to control the rate. Recommend D1/puree and thin liquids with 1:1 feeder assist, po medications presented crushed as able in puree, strict aspiration precautions and assist  with oral care, monitor for signs of thrush (alerted RN). SLP will follow during acute stay. SLP Visit Diagnosis: Dysphagia, unspecified (R13.10)    Aspiration Risk  Mild aspiration risk    Diet Recommendation Dysphagia 1 (Puree);Thin liquid    Liquid Administration via: Cup;Straw Medication Administration: Crushed with puree Supervision: Full supervision/cueing for compensatory strategies;Staff to assist with self feeding Compensations: Slow rate;Small sips/bites Postural Changes: Seated upright at 90 degrees;Remain upright for at least 30 minutes after po intake    Other  Recommendations Oral Care Recommendations: Staff/trained caregiver to provide oral care;Oral care BID Other Recommendations: Clarify dietary restrictions   Follow up Recommendations 24 hour supervision/assistance      Frequency and Duration min 2x/week  1 week       Prognosis Prognosis for Safe Diet Advancement: Fair Barriers to Reach Goals: Cognitive deficits      Swallow Study   General Date of Onset: 07/09/21 HPI: Taylor Andrews is a 87 y.o. BF PMHx dementia,, hypertension, DM type II controlled, fatty liver disease,  subdural hematoma 2015. Presented ED from nursing home with reports of altered mental status and cough.  At the time of my evaluation, patient is somnolent, not answering questions.  Patient was brought to the ED with reports of cough, change in mental status. Hospitalization 8/14-8/16 for dehydration and possible sepsis with increased procalcitonin, patient was started on Vanco and cefepime, brain MRI was unremarkable. COVID test later resulted positive. BSE requested. Type of Study: Bedside Swallow Evaluation Previous Swallow Assessment: N/A Diet Prior to this Study: NPO Temperature Spikes Noted: No Respiratory Status: Room air History of Recent Intubation: No Behavior/Cognition: Alert;Lethargic/Drowsy;Requires cueing Oral Cavity Assessment:  (white lingual coating, thick and chunky) Oral Care Completed by SLP: Yes Oral Cavity - Dentition: Poor condition (sparse dentition) Vision: Impaired for self-feeding Self-Feeding Abilities: Total assist Patient Positioning: Upright in bed Baseline Vocal Quality: Not observed Volitional  Cough: Cognitively unable to elicit Volitional Swallow: Unable to elicit    Oral/Motor/Sensory Function Overall Oral Motor/Sensory Function:  (difficult to formerly assess due to lethargy)   Ice Chips Ice chips: Within functional limits Presentation: Spoon   Thin Liquid Thin Liquid: Impaired Presentation: Cup;Spoon;Straw Oral Phase Impairments: Reduced labial seal    Nectar Thick Nectar Thick Liquid: Not tested   Honey Thick Honey Thick Liquid: Not tested   Puree Puree: Within functional limits Presentation: Spoon   Solid     Solid: Not tested     Thank you,  Genene Churn, [place]  [place] 07/10/2021,10:56 AM

## 2021-07-10 NOTE — Progress Notes (Signed)
Patient incentive / flutter placed in room. Albuterol inhaler with spacer also placed in room. Saturation 98 on room air. Blood gas completed.

## 2021-07-11 ENCOUNTER — Inpatient Hospital Stay (HOSPITAL_COMMUNITY): Payer: Medicare Other

## 2021-07-11 ENCOUNTER — Inpatient Hospital Stay: Payer: Self-pay

## 2021-07-11 DIAGNOSIS — I82402 Acute embolism and thrombosis of unspecified deep veins of left lower extremity: Secondary | ICD-10-CM | POA: Diagnosis present

## 2021-07-11 DIAGNOSIS — M869 Osteomyelitis, unspecified: Secondary | ICD-10-CM

## 2021-07-11 DIAGNOSIS — I2699 Other pulmonary embolism without acute cor pulmonale: Secondary | ICD-10-CM | POA: Diagnosis present

## 2021-07-11 DIAGNOSIS — I2601 Septic pulmonary embolism with acute cor pulmonale: Secondary | ICD-10-CM

## 2021-07-11 LAB — CBC WITH DIFFERENTIAL/PLATELET
Abs Immature Granulocytes: 0.16 10*3/uL — ABNORMAL HIGH (ref 0.00–0.07)
Basophils Absolute: 0.1 10*3/uL (ref 0.0–0.1)
Basophils Relative: 0 %
Eosinophils Absolute: 0 10*3/uL (ref 0.0–0.5)
Eosinophils Relative: 0 %
HCT: 32.3 % — ABNORMAL LOW (ref 36.0–46.0)
Hemoglobin: 10 g/dL — ABNORMAL LOW (ref 12.0–15.0)
Immature Granulocytes: 1 %
Lymphocytes Relative: 5 %
Lymphs Abs: 0.9 10*3/uL (ref 0.7–4.0)
MCH: 28.2 pg (ref 26.0–34.0)
MCHC: 31 g/dL (ref 30.0–36.0)
MCV: 91 fL (ref 80.0–100.0)
Monocytes Absolute: 0.8 10*3/uL (ref 0.1–1.0)
Monocytes Relative: 4 %
Neutro Abs: 17.5 10*3/uL — ABNORMAL HIGH (ref 1.7–7.7)
Neutrophils Relative %: 90 %
Platelets: 242 10*3/uL (ref 150–400)
RBC: 3.55 MIL/uL — ABNORMAL LOW (ref 3.87–5.11)
RDW: 13.9 % (ref 11.5–15.5)
WBC: 19.5 10*3/uL — ABNORMAL HIGH (ref 4.0–10.5)
nRBC: 0 % (ref 0.0–0.2)

## 2021-07-11 LAB — LACTIC ACID, PLASMA: Lactic Acid, Venous: 1.2 mmol/L (ref 0.5–1.9)

## 2021-07-11 LAB — HEPARIN LEVEL (UNFRACTIONATED): Heparin Unfractionated: 0.16 IU/mL — ABNORMAL LOW (ref 0.30–0.70)

## 2021-07-11 LAB — D-DIMER, QUANTITATIVE: D-Dimer, Quant: >20 ug/mL-FEU — ABNORMAL HIGH (ref 0.00–0.50)

## 2021-07-11 LAB — COMPREHENSIVE METABOLIC PANEL
ALT: 16 U/L (ref 0–44)
AST: 22 U/L (ref 15–41)
Albumin: 2.1 g/dL — ABNORMAL LOW (ref 3.5–5.0)
Alkaline Phosphatase: 70 U/L (ref 38–126)
Anion gap: 10 (ref 5–15)
BUN: 17 mg/dL (ref 8–23)
CO2: 25 mmol/L (ref 22–32)
Calcium: 8.4 mg/dL — ABNORMAL LOW (ref 8.9–10.3)
Chloride: 107 mmol/L (ref 98–111)
Creatinine, Ser: 0.61 mg/dL (ref 0.44–1.00)
GFR, Estimated: >60 mL/min (ref >60)
Glucose, Bld: 83 mg/dL (ref 70–99)
Potassium: 3.3 mmol/L — ABNORMAL LOW (ref 3.5–5.1)
Sodium: 142 mmol/L (ref 135–145)
Total Bilirubin: 0.2 mg/dL — ABNORMAL LOW (ref 0.3–1.2)
Total Protein: 6.1 g/dL — ABNORMAL LOW (ref 6.5–8.1)

## 2021-07-11 LAB — C-REACTIVE PROTEIN: CRP: 20.8 mg/dL — ABNORMAL HIGH (ref <1.0)

## 2021-07-11 LAB — GLUCOSE, CAPILLARY
Glucose-Capillary: 88 mg/dL (ref 70–99)
Glucose-Capillary: 112 mg/dL — ABNORMAL HIGH (ref 70–99)
Glucose-Capillary: 171 mg/dL — ABNORMAL HIGH (ref 70–99)
Glucose-Capillary: 168 mg/dL — ABNORMAL HIGH (ref 70–99)
Glucose-Capillary: 98 mg/dL (ref 70–99)
Glucose-Capillary: 104 mg/dL — ABNORMAL HIGH (ref 70–99)

## 2021-07-11 LAB — PHOSPHORUS: Phosphorus: 2.6 mg/dL (ref 2.5–4.6)

## 2021-07-11 LAB — PROCALCITONIN: Procalcitonin: 1.02 ng/mL

## 2021-07-11 LAB — LACTATE DEHYDROGENASE: LDH: 226 U/L — ABNORMAL HIGH (ref 98–192)

## 2021-07-11 LAB — FERRITIN: Ferritin: 625 ng/mL — ABNORMAL HIGH (ref 11–307)

## 2021-07-11 LAB — MAGNESIUM: Magnesium: 2.3 mg/dL (ref 1.7–2.4)

## 2021-07-11 MED ORDER — HYDROCORTISONE NA SUCCINATE PF 250 MG IJ SOLR: 200.0000 mg | Freq: Once | INTRAMUSCULAR | Status: DC

## 2021-07-11 MED ORDER — DIPHENHYDRAMINE HCL 25 MG PO CAPS: 50.0000 mg | ORAL_CAPSULE | Freq: Once | ORAL | Status: AC

## 2021-07-11 MED ORDER — CIPROFLOXACIN IN D5W 400 MG/200ML IV SOLN
400.0000 mg | Freq: Two times a day (BID) | INTRAVENOUS | Status: AC
Start: 2021-07-11 — End: 2021-07-18
  Administered 2021-07-12 – 2021-07-18 (×14): 400 mg via INTRAVENOUS
  Filled 2021-07-11 (×14): qty 200

## 2021-07-11 MED ORDER — DIPHENHYDRAMINE HCL 25 MG PO CAPS: 50.0000 mg | ORAL_CAPSULE | Freq: Once | ORAL | Status: DC

## 2021-07-11 MED ORDER — HEPARIN (PORCINE) 25000 UT/250ML-% IV SOLN
850.0000 [IU]/h | INTRAVENOUS | Status: AC
  Administered 2021-07-11: 700 [IU]/h via INTRAVENOUS
  Administered 2021-07-14 – 2021-07-16 (×2): 950 [IU]/h via INTRAVENOUS
  Filled 2021-07-11 (×5): qty 250

## 2021-07-11 MED ORDER — POTASSIUM CHLORIDE 10 MEQ/100ML IV SOLN
10.0000 meq | INTRAVENOUS | Status: AC
Start: 2021-07-11 — End: 2021-07-11
  Administered 2021-07-11 (×2): 10 meq via INTRAVENOUS
  Filled 2021-07-11 (×3): qty 100

## 2021-07-11 MED ORDER — DIPHENHYDRAMINE HCL 50 MG/ML IJ SOLN
50.0000 mg | Freq: Once | INTRAMUSCULAR | Status: AC
  Administered 2021-07-11: 50 mg via INTRAVENOUS
  Filled 2021-07-11: qty 1

## 2021-07-11 MED ORDER — HEPARIN BOLUS VIA INFUSION
2000.0000 [IU] | Freq: Once | INTRAVENOUS | Status: AC
  Administered 2021-07-11: 2000 [IU] via INTRAVENOUS
  Filled 2021-07-11: qty 2000

## 2021-07-11 MED ORDER — DIPHENHYDRAMINE HCL 50 MG/ML IJ SOLN: 50.0000 mg | Freq: Once | INTRAMUSCULAR | Status: DC

## 2021-07-11 MED ORDER — POTASSIUM CHLORIDE 10 MEQ/100ML IV SOLN
10.0000 meq | INTRAVENOUS | Status: DC
  Administered 2021-07-11 (×3): 10 meq via INTRAVENOUS
  Filled 2021-07-11 (×2): qty 100

## 2021-07-11 MED ORDER — IOHEXOL 350 MG/ML SOLN
75.0000 mL | Freq: Once | INTRAVENOUS | Status: AC | PRN
  Administered 2021-07-11: 75 mL via INTRAVENOUS

## 2021-07-11 NOTE — Progress Notes (Signed)
Attending requested to review am labs not wnl and last lactic acid collected on 9/5. Waiting for response at this times. Current VSS.

## 2021-07-11 NOTE — Progress Notes (Signed)
PROGRESS NOTE    Taylor Andrews  GPQ:[id number] DOB: 1933/12/04 DOA: 07/09/2021 PCP: Redmond School, MD   Brief Narrative:   Taylor Andrews is a 87 y.o. BF PMHx dementia,, hypertension, DM type II controlled, fatty liver disease,  subdural hematoma 2015.   Presented ED from nursing home with reports of altered mental status and cough.  At the time of my evaluation, patient is somnolent, not answering questions.  Patient was brought to the ED with reports of cough, change in mental status.   I am unable to reach nursing home.  I talked to patient's niece who is power of attorney- [name], she reports patient has been sick since last Sunday-at least 1 week.  Has been weak, not eating, not wanting to get up or walk.  At baseline, patient can feed her self, occasionally recognizes family, but cannot answer questions.  Today patient started coughing, so she was brought to the ED.   Hospitalization 8/14-8/16 for dehydration and possible sepsis with increased procalcitonin, patient was started on Vanco and cefepime, brain MRI was unremarkable.  Cultures were unremarkable.  Patient was discharged home on a course of antibiotics.   ED Course: Temperature 99.1.  Heart rate 70s to 112 initially ED.  Respiratory rate 17-22.  Blood pressure 99-140.   O2 sats on room air, 100%.  Head CT and chest x-ray without acute abnormality.  Lactic acidosis 4.7 > 2.4.  UA unremarkable.  Troponin 14 > 15.  WBC 20.8.   Blood cultures obtained.  Broad-spectrum antibiotics Vanco cefepime and metronidazole ordered.  COVID test later resulted positive.  Hospitalist to admit.   Subjective: 9/6 afebrile overnight eyes open and tracking with her eyes.  Does not follow commands   Assessment & Plan:  Covid vaccination; vaccinated 2/4 per niece  Principal Problem:   Acute encephalopathy Active Problems:   Dementia ([place])   HTN (hypertension)   DM (diabetes mellitus), type 2 ([place])   COVID-19 virus infection   Severe sepsis  ([place])   AMS (altered mental status)   Decubitus ulcer of sacral region, unstageable ([place])   Sacral osteomyelitis ([place])   Cellulitis of sacral region   Left leg DVT ([place])   Pulmonary embolus ([place])  Severe sepsis - On admission patient met criteria for severe sepsis WBC> 12 K, HR> 90, site of infection COVID 19 infection, sacral decubitus ulcer/cellulitis (osteomyelitis?)  Lactic acid> 2, 9/5 normal saline 110m/hr -9/6 PICC Line pending  COVID-19 positive Infection -On admission CXR unremarkable however at some point during admission patient was on   COVID-19 Labs  Recent Labs    07/09/21 1751 07/09/21 1801 07/10/21 0401 07/11/21 0442 07/11/21 0515  DDIMER  --  6.67* 4.52* >20.00*  --   FERRITIN 705*  --  588*  --  625*  LDH  --  248*  --  226*  --   CRP 30.4*  --  22.9*  --  20.8*    Lab Results  Component Value Date   SARSCOV2NAA POSITIVE (A) 07/09/2021   [place] BedsNEGATIVE 06/18/2021    Results for RLEVA, BAINE(MRN [id number] as of 07/10/2021 07:48  Ref. Range 07/09/2021 18:01 07/10/2021 04:01  Procalcitonin Latest Units: ng/mL 1.17 0.85   Results for RLAURIELLE, SELMON(MRN [id number] as of 07/10/2021 10:16  Ref. Range 07/09/2021 17:50 07/10/2021 09:03 07/10/2021 11:16  Lactic Acid, Venous Latest Ref Range: 0.5 - 1.9 mmol/L 2.4 (HH) 2.3 (HH) 2.3 (HH)   -9/5 swallow study  - 9/5 Remdesivir per  pharmacy protocol -9/5 Solu-Medrol 60 mg BID -9/5 DuoNeb QID - 9/5 vitamins per COVID protocol - 9/5 incentive spirometry - 9/5 flutter valve  Elevated D-dimer - Elevated D-dimer and COVID-positive patient suspicious for PE/DVT - 9/6 CT PE protocol positive for large PE see results below - 9/6 bilateral lower extremity Doppler positive DVT - 9/6 empirically start full dose heparin until DVT/PE proven negative  positive for large PE/ positive DVT -9/6 Heparin per pharmacy protocol  Unstageable sacral decubitus ulcer/osteomyelitis? Pressure Injury 07/10/21 Coccyx Medial  Unstageable - Full thickness tissue loss in which the base of the injury is covered by slough (yellow, tan, gray, green or brown) and/or eschar (tan, brown or black) in the wound bed. (Active)  07/10/21 0000  Location: Coccyx  Location Orientation: Medial  Staging: Unstageable - Full thickness tissue loss in which the base of the injury is covered by slough (yellow, tan, gray, green or brown) and/or eschar (tan, brown or black) in the wound bed.  Wound Description (Comments):   Present on Admission: Yes  -9/5 DG LS pending -9/5 discussed case with RPH  GERD we will start vancomycin + ciprofloxacin  Acute metabolic encephalopathy/Baseline Dementia.   -Multifactorial sacral decubitus ulcer/cellulitis, COVID-19 infection, dehydration. -Per admission note At baseline, patient can feed her self, occasionally recognizes family, but cannot answer questions. - Currently unable to feed herself or follow commands.  Hypertension- -Patient with severe sepsis hold all BP medication -Monitor closely and will add medication back as required.  DM type II controlled -8/15 hemoglobin A1c= 5.2 - Moderate SSI  Hypokalemia - Potassium goal> -Potassium IV 60 mEq    DVT prophylaxis: Lovenox Code Status: DNR Family Communication:  Status is: Inpatient    Dispo: The patient is from: nursing home              Anticipated d/c is to: nursing home              Anticipated d/c date is: > 3 days              Patient currently is not medically stable to d/c.      Consultants:    Procedures/Significant Events:  9/6 CTA PE protocol:Large right-sided pulmonary embolus is noted which extends into upper and lower lobe branches. Positive for acute PE with CT evidence of right heart strain (RV/LV Ratio = 1.4) consistent with at least submassive (intermediate risk) PE.  9/6 bilateral lower extremity ultrasound: LEFT lower extremity; age-indeterminate thrombus involving the left femoral vein and left  popliteal vein. Findings are suggestive for chronic DVT rather than acute DVT. RIGHT lower extremity negative DVT     I have personally reviewed and interpreted all radiology studies and my findings are as above.  VENTILATOR SETTINGS: Room air 9/6 SPO2 100%    Cultures 9/4 SARS coronavirus positive  Antimicrobials: Anti-infectives (From admission, onward)    Start     Ordered Stop   07/11/21 1000  remdesivir 100 mg in sodium chloride 0.9 % 100 mL IVPB        07/10/21 0832 07/15/21 0959   07/10/21 1800  vancomycin (VANCOREADY) IVPB 500 mg/100 mL  Status:  Discontinued        07/09/21 1754 07/09/21 2308   07/10/21 1700  vancomycin (VANCOREADY) IVPB 500 mg/100 mL        07/10/21 1021     07/10/21 1600  ceFEPIme (MAXIPIME) 2 g in sodium chloride 0.9 % 100 mL IVPB  Status:  Discontinued  07/09/21 1754 07/09/21 2308   07/10/21 1115  ciprofloxacin (CIPRO) IVPB 400 mg        07/10/21 1021     07/10/21 0930  remdesivir 100 mg in sodium chloride 0.9 % 100 mL IVPB        07/10/21 0832 07/10/21 1129   07/09/21 1745  ceFEPIme (MAXIPIME) 2 g in sodium chloride 0.9 % 100 mL IVPB        07/09/21 1737 07/09/21 1850   07/09/21 1745  metroNIDAZOLE (FLAGYL) IVPB 500 mg        07/09/21 1737 07/09/21 1922   07/09/21 1745  vancomycin (VANCOCIN) IVPB 1000 mg/200 mL premix        07/09/21 1737 07/09/21 1945         Devices    LINES / TUBES:      Continuous Infusions:  sodium chloride 100 mL/hr at 07/11/21 1810   ciprofloxacin     heparin 700 Units/hr (07/11/21 1810)   lactated ringers     potassium chloride Stopped (07/11/21 1749)   remdesivir 100 mg in NS 100 mL Stopped (07/11/21 0936)   vancomycin Stopped (07/10/21 1801)     Objective: Vitals:   07/11/21 1330 07/11/21 1500 07/11/21 1600 07/11/21 1700  BP: (!) 139/57   122/86  Pulse: 73 79 63 87  Resp: (!) 21 (!) _0 Temp: 98.3 F (36.8 C)   98.3 F (36.8 C)  TempSrc: Axillary   Axillary  SpO2: 100% 100%  100% 100%  Weight:      Height:        Intake/Output Summary (Last 24 hours) at 07/11/2021 1833 Last data filed at 07/11/2021 1810 Gross per 24 hour  Intake 2537.87 ml  Output 1900 ml  Net 637.87 ml   Filed Weights   07/09/21 1620 07/09/21 2249 07/11/21 0500  Weight: 54.4 kg 42.6 kg 43.4 kg    Examination:  General: A/O x0 (eyes open), does not respond to commands, No acute respiratory distress, cachectic Eyes: negative scleral hemorrhage, negative anisocoria, negative icterus ENT: Negative Runny nose, negative gingival bleeding, Neck:  Negative scars, masses, torticollis, lymphadenopathy, JVD Lungs: Clear to auscultation bilaterally without wheezes or crackles Cardiovascular: Regular rate and rhythm without murmur gallop or rub normal S1 and S2 Abdomen: negative abdominal pain, nondistended, positive soft, bowel sounds, no rebound, no ascites, no appreciable mass Extremities: No significant cyanosis, clubbing, or edema bilateral lower extremities Skin: Legible sacral decubitus ulcer appears may track all the way to the sacral bone. Psychiatric: Unable to assess Central nervous system: Patient eyes are open tracks you.  Does not follow commands unable to assess further  .     Data Reviewed: Care during the described time interval was provided by me .  I have reviewed this patient's available data, including medical history, events of note, physical examination, and all test results as part of my evaluation.   CBC: Recent Labs  Lab 07/09/21 1628 07/10/21 0401 07/11/21 0442  WBC 20.8* 16.6* 19.5*  NEUTROABS 18.7* 13.6* 17.5*  HGB 12.0 9.9* 10.0*  HCT 38.6 32.0* 32.3*  MCV 91.9 91.2 91.0  PLT 365 276 259   Basic Metabolic Panel: Recent Labs  Lab 07/09/21 1628 07/10/21 0401 07/11/21 0442  NA 143 143 142  K 3.8 3.5 3.3*  CL 109 107 107  CO2 _1 GLUCOSE 254* 141* 83  BUN 29* 20 17  CREATININE 1.07* 0.73 0.61  CALCIUM 8.7* 8.6* 8.4*  MG  --  2.3 2.3  PHOS   --  2.3* 2.6   GFR: Estimated Creatinine Clearance: 35.9 mL/min (by C-G formula based on SCr of 0.61 mg/dL). Liver Function Tests: Recent Labs  Lab 07/09/21 1628 07/10/21 0401 07/11/21 0442  AST _0 ALT _1 ALKPHOS 74 56 70  BILITOT 0.5 0.5 0.2*  PROT 7.7 6.0* 6.1*  ALBUMIN 2.8* 2.2* 2.1*   No results for input(s): LIPASE, AMYLASE in the last 168 hours. No results for input(s): AMMONIA in the last 168 hours. Coagulation Profile: No results for input(s): INR, PROTIME in the last 168 hours. Cardiac Enzymes: No results for input(s): CKTOTAL, CKMB, CKMBINDEX, TROPONINI in the last 168 hours. BNP (last 3 results) No results for input(s): PROBNP in the last 8760 hours. HbA1C: No results for input(s): HGBA1C in the last 72 hours. CBG: Recent Labs  Lab 07/11/21 0446 07/11/21 0746 07/11/21 1131 07/11/21 1323 07/11/21 1641  GLUCAP 88 112* 171* 168* 98   Lipid Profile: No results for input(s): CHOL, HDL, LDLCALC, TRIG, CHOLHDL, LDLDIRECT in the last 72 hours. Thyroid Function Tests: No results for input(s): TSH, T4TOTAL, FREET4, T3FREE, THYROIDAB in the last 72 hours. Anemia Panel: Recent Labs    07/10/21 0401 07/11/21 0515  FERRITIN 588* 625*   Urine analysis:    Component Value Date/Time   COLORURINE YELLOW 07/09/2021 1748   APPEARANCEUR HAZY (A) 07/09/2021 1748   LABSPEC 1.025 07/09/2021 1748   PHURINE 5.5 07/09/2021 1748   GLUCOSEU NEGATIVE 07/09/2021 1748   HGBUR NEGATIVE 07/09/2021 1748   BILIRUBINUR NEGATIVE 07/09/2021 1748   KETONESUR NEGATIVE 07/09/2021 1748   PROTEINUR TRACE (A) 07/09/2021 1748   UROBILINOGEN 0.2 07/13/2015 1024   NITRITE NEGATIVE 07/09/2021 1748   LEUKOCYTESUR NEGATIVE 07/09/2021 1748   Sepsis Labs: _2 (procalcitonin:4,lacticidven:4)  ) Recent Results (from the past 240 hour(s))  Resp Panel by RT-PCR (Flu A&B, Covid) Nasopharyngeal Swab     Status: Abnormal   Collection Time: 07/09/21  5:41 PM   Specimen:  Nasopharyngeal Swab; Nasopharyngeal(NP) swabs in vial transport medium  Result Value Ref Range Status   SARS Coronavirus 2 by RT PCR POSITIVE (A) NEGATIVE Final    Comment: CRITICAL RESULT CALLED TO, READ BACK BY AND VERIFIED WITH: Enoch 07/09/2021 COLEMAN,R (NOTE) SARS-CoV-2 target nucleic acids are DETECTED.  The SARS-CoV-2 RNA is generally detectable in upper respiratory specimens during the acute phase of infection. Positive results are indicative of the presence of the identified virus, but do not rule out bacterial infection or co-infection with other pathogens not detected by the test. Clinical correlation with patient history and other diagnostic information is necessary to determine patient infection status. The expected result is Negative.  Fact Sheet for Patients: EntrepreneurPulse.com.au  Fact Sheet for Healthcare Providers: IncredibleEmployment.be  This test is not yet approved or cleared by the [place] FDA and  has been authorized for detection and/or diagnosis of SARS-CoV-2 by FDA under an Emergency Use Authorization (EUA).  This EUA will remain in effect (meaning this t est can be used) for the duration of  the COVID-19 declaration under Section 564(b)(1) of the Act, 21 U.S.C. section 360bbb-3(b)(1), unless the authorization is terminated or revoked sooner.     Influenza A by PCR NEGATIVE NEGATIVE Final   Influenza B by PCR NEGATIVE NEGATIVE Final    Comment: (NOTE) The Xpert Xpress SARS-CoV-2/FLU/RSV plus assay is intended as an aid in the diagnosis of influenza from Nasopharyngeal swab specimens and should not be used as a  sole basis for treatment. Nasal washings and aspirates are unacceptable for Xpert Xpress SARS-CoV-2/FLU/RSV testing.  Fact Sheet for Patients: EntrepreneurPulse.com.au  Fact Sheet for Healthcare Providers: IncredibleEmployment.be  This test is not yet  approved or cleared by the [place] FDA and has been authorized for detection and/or diagnosis of SARS-CoV-2 by FDA under an Emergency Use Authorization (EUA). This EUA will remain in effect (meaning this test can be used) for the duration of the COVID-19 declaration under Section 564(b)(1) of the Act, 21 U.S.C. section 360bbb-3(b)(1), unless the authorization is terminated or revoked.  Performed at [facility], [address], [place], [place] [address]   Culture, blood (routine x 2)     Status: None (Preliminary result)   Collection Time: 07/09/21  5:50 PM   Specimen: Right Antecubital; Blood  Result Value Ref Range Status   Specimen Description RIGHT ANTECUBITAL  Final   Special Requests   Final    BOTTLES DRAWN AEROBIC AND ANAEROBIC Blood Culture adequate volume   Culture   Final    NO GROWTH 2 DAYS Performed at [facility], [address], [place], [place] [address]    Report Status PENDING  Incomplete  Culture, blood (routine x 2)     Status: None (Preliminary result)   Collection Time: 07/09/21  6:14 PM   Specimen: BLOOD RIGHT ARM  Result Value Ref Range Status   Specimen Description BLOOD RIGHT ARM  Final   Special Requests   Final    BOTTLES DRAWN AEROBIC ONLY Blood Culture adequate volume   Culture   Final    NO GROWTH 2 DAYS Performed at [facility], [address] [place], [place] [address]    Report Status PENDING  Incomplete  MRSA Next Gen by PCR, Nasal     Status: None   Collection Time: 07/09/21 10:44 PM   Specimen: Nasal Mucosa; Nasal Swab  Result Value Ref Range Status   MRSA by PCR Next Gen NOT DETECTED NOT DETECTED Final    Comment: (NOTE) The GeneXpert MRSA Assay (FDA approved for NASAL specimens only), is one component of a comprehensive MRSA colonization surveillance program. It is not intended to diagnose MRSA infection nor to guide or monitor treatment for MRSA infections. Test performance is not FDA approved in patients less than 60  years old. Performed at [facility], [address], [place], [place] [address]          Radiology Studies: DG Sacrum/Coccyx  Result Date: 07/10/2021 CLINICAL DATA:  Pressure ulcer at sacrum with yellowish discharge, dimension EXAM: SACRUM AND COCCYX - 2+ VIEW COMPARISON:  04/05/2009 FINDINGS: SI joints and sacral foramina preserved. Diffuse osseous demineralization. Dressing artifacts dorsal to sacrum. No sacrococcygeal fracture or definite bone destruction identified though the dorsal margin of the distal sacrum is very near the skin surface on lateral view. IMPRESSION: No definite bone destruction is seen to suggest sacrococcygeal osteomyelitis. Electronically Signed   By: Lavonia Dana M.D.   On: 07/10/2021 13:43   CT Angio Chest Pulmonary Embolism (PE) W or WO Contrast  Result Date: 07/11/2021 CLINICAL DATA:  Elevated D-dimer.  COVID-19. EXAM: CT ANGIOGRAPHY CHEST WITH CONTRAST TECHNIQUE: Multidetector CT imaging of the chest was performed using the standard protocol during bolus administration of intravenous contrast. Multiplanar CT image reconstructions and MIPs were obtained to evaluate the vascular anatomy. CONTRAST:  10m OMNIPAQUE IOHEXOL 350 MG/ML SOLN COMPARISON:  May 27, 2012. FINDINGS: Cardiovascular: Large linear filling defect is noted in distal right pulmonary artery which extends  into the upper and lower lobe branches consistent with acute pulmonary embolus. RV/LV ratio of 1.4 is noted consistent with right heart strain. Pericardial effusion is noted. Atherosclerosis of thoracic aorta is noted without aneurysm or dissection. Mediastinum/Nodes: No enlarged mediastinal, hilar, or axillary lymph nodes. Thyroid gland, trachea, and esophagus demonstrate no significant findings. Lungs/Pleura: No pneumothorax or pleural effusion is noted. Minimal bilateral posterior basilar subsegmental atelectasis is noted. Multiple focal airspace opacities are noted in the right upper lobe which may  represent multifocal pneumonia. Upper Abdomen: No acute abnormality. Musculoskeletal: No chest wall abnormality. No acute or significant osseous findings. Review of the MIP images confirms the above findings. IMPRESSION: Large right-sided pulmonary embolus is noted which extends into upper and lower lobe branches. Positive for acute PE with CT evidence of right heart strain (RV/LV Ratio = 1.4) consistent with at least submassive (intermediate risk) PE. The presence of right heart strain has been associated with an increased risk of morbidity and mortality. Please refer to the "PE Focused" order set in EPIC. Critical Value/emergent results were called by telephone at the time of interpretation on 07/11/2021 at 2:35 pm to provider [facility] , who verbally acknowledged these results. Multiple small focal airspace opacities are noted in the right upper lobe consistent with multifocal pneumonia. Aortic Atherosclerosis (ICD10-I70.0). Electronically Signed   By: Marijo Conception M.D.   On: 07/11/2021 14:35   US Venous Img Lower Bilateral (DVT)  Result Date: 07/11/2021 CLINICAL DATA:  Elevated D-dimer EXAM: BILATERAL LOWER EXTREMITY VENOUS DOPPLER ULTRASOUND TECHNIQUE: Gray-scale sonography with graded compression, as well as color Doppler and duplex ultrasound were performed to evaluate the lower extremity deep venous systems from the level of the common femoral vein and including the common femoral, femoral, profunda femoral, popliteal and calf veins including the posterior tibial, peroneal and gastrocnemius veins when visible. The superficial great saphenous vein was also interrogated. Spectral Doppler was utilized to evaluate flow at rest and with distal augmentation maneuvers in the common femoral, femoral and popliteal veins. COMPARISON:  None. FINDINGS: RIGHT LOWER EXTREMITY Common Femoral Vein: No evidence of thrombus. Normal compressibility, respiratory phasicity and response to augmentation. Saphenofemoral  Junction: No evidence of thrombus. Normal compressibility and flow on color Doppler imaging. Profunda Femoral Vein: No evidence of thrombus. Normal compressibility and flow on color Doppler imaging. Femoral Vein: No evidence of thrombus. Normal compressibility, respiratory phasicity and response to augmentation. Popliteal Vein: No evidence of thrombus. Normal compressibility, respiratory phasicity and response to augmentation. Calf Veins: No evidence of thrombus. Normal compressibility and flow on color Doppler imaging. LEFT LOWER EXTREMITY Common Femoral Vein: No evidence of thrombus. Normal compressibility and color Doppler flow. Saphenofemoral Junction: No evidence of thrombus. Normal compressibility and flow on color Doppler imaging. Profunda Femoral Vein: No evidence of thrombus. Normal compressibility and flow on color Doppler imaging. Femoral Vein: Normal compressibility in the proximal and mid femoral vein. Incomplete compressibility of the distal left femoral vein. Wall thickening involving the mid and distal femoral vein. There is flow within the mid and distal femoral vein. Popliteal Vein: Incomplete compressibility in the left popliteal vein with wall thickening. Color Doppler flow in the left popliteal vein. Calf Veins: Limited evaluation. Other Findings:  None. IMPRESSION: 1. Age-indeterminate thrombus in the left lower extremity, involving the left femoral vein and left popliteal vein. Findings are suggestive for chronic DVT rather than acute DVT. 2.  Negative for deep venous thrombosis in right lower extremity. These results will be called to the ordering clinician  or representative by the Radiologist Assistant, and communication documented in the PACS or [employer]. Electronically Signed   By: Markus Daft M.D.   On: 07/11/2021 12:49   [place] EKG SITE RITE  Result Date: 07/11/2021 If Site Rite image not attached, placement could not be confirmed due to current cardiac rhythm.        Scheduled Meds:  vitamin C  500 mg Oral BID   Chlorhexidine Gluconate Cloth  6 each Topical Daily   collagenase   Topical Daily   feeding supplement  1 Container Oral TID BM   insulin aspart  0-15 Units Subcutaneous Q4H   methylPREDNISolone (SOLU-MEDROL) injection  60 mg Intravenous Q12H   multivitamin-lutein  1 capsule Oral Daily   zinc sulfate  220 mg Oral Daily   Continuous Infusions:  sodium chloride 100 mL/hr at 07/11/21 1810   ciprofloxacin     heparin 700 Units/hr (07/11/21 1810)   lactated ringers     potassium chloride Stopped (07/11/21 1749)   remdesivir 100 mg in NS 100 mL Stopped (07/11/21 0936)   vancomycin Stopped (07/10/21 1801)     LOS: 1 day   The patient is critically ill with multiple organ systems failure and requires high complexity decision making for assessment and support, frequent evaluation and titration of therapies, application of advanced monitoring technologies and extensive interpretation of multiple databases. Critical Care Time devoted to patient care services described in this note  Time spent: 40 minutes     , Geraldo Docker, MD [facility]   If 7PM-7AM, please contact night-coverage 07/11/2021, 6:33 PM

## 2021-07-11 NOTE — Progress Notes (Signed)
[facility] team called and stated they were unable to place PICC tonight. Attending notified and clarified need for PICC to be placed tonight. Vascular Wellness not available per [facility]. AC to follow up on next point of contact to place PICC. Phlebotomy unable to collect labs after multiple attempts. Report given to night shift RN Leanor Kail assuming care. VSS at this time.

## 2021-07-11 NOTE — Progress Notes (Signed)
ANTICOAGULATION CONSULT NOTE   Pharmacy Consult for Heparin Indication: pulmonary embolus  Allergies  Allergen Reactions   Milk-Related Compounds Other (See Comments)    Lactose Intolerant    Iohexol Other (See Comments)     Desc: PT STATES THAT THE CONTRAST THAT SHE HAD IN 2004 MADE HER SICK, PT COULD NOT SPECIFY REACTION TYPES     Patient Measurements: Height: '5\' 1"'$  (154.9 cm) Weight: 43.4 kg (95 lb 10.9 oz) IBW/kg (Calculated) : 47.8 HEPARIN DW (KG): 42.6   Vital Signs: Temp: 98.5 F (36.9 C) (09/06 1905) Temp Source: Axillary (09/06 1905) BP: 112/43 (09/06 2000) Pulse Rate: 65 (09/06 2000)  Labs: Recent Labs    07/09/21 1628 07/09/21 1801 07/10/21 0401 07/11/21 0442 07/11/21 2029  HGB 12.0  --  9.9* 10.0*  --   HCT 38.6  --  32.0* 32.3*  --   PLT 365  --  276 242  --   HEPARINUNFRC  --   --   --   --  0.16*  CREATININE 1.07*  --  0.73 0.61  --   TROPONINIHS 14 15  --   --   --      Estimated Creatinine Clearance: 35.9 mL/min (by C-G formula based on SCr of 0.61 mg/dL).   Medical History: Past Medical History:  Diagnosis Date   Adenomatous colon polyp    carcinoma in situ   AMS (altered mental status) 07/09/2021   Chronic abdominal pain    Dementia (HCC)    Diabetes mellitus    Fatty liver    on [place] normal LFT'S   GERD (gastroesophageal reflux disease)    Hyperlipidemia    Hypertension    Iron deficiency    Chronic current parameter normal; 4/09 iron was 12 sat 5%   SDH (subdural hematoma) ([place]) 01/2014   chronic    Medications:  Medications Prior to Admission  Medication Sig Dispense Refill Last Dose   amLODipine (NORVASC) 10 MG tablet Take 10 mg by mouth daily.    07/09/2021   donepezil (ARICEPT) 10 MG tablet Take 10 mg by mouth daily.    07/09/2021   hydrochlorothiazide 25 MG tablet Take 25 mg by mouth daily.    07/09/2021   memantine (NAMENDA) 10 MG tablet Take 10 mg by mouth 2 (two) times daily.   07/09/2021   niacin (NIASPAN) 500 MG CR tablet  Take 500 mg by mouth daily.    07/09/2021   Omega-3 Fatty Acids (FISH OIL) 1000 MG CAPS Take 1 capsule by mouth 2 (two) times daily with a meal.    07/09/2021   oxybutynin (DITROPAN) 5 MG tablet Take 5 mg by mouth 2 (two) times daily.   07/09/2021   potassium chloride SA (K-DUR,KLOR-CON) 20 MEQ tablet Take 20 mEq by mouth daily.   07/09/2021   Probiotic Product (PROBIOTIC BLEND) CAPS Take 1 capsule by mouth daily.   07/09/2021   docusate sodium (COLACE) 100 MG capsule Take 100 mg by mouth 3 (three) times daily. (Patient not taking: Reported on 07/10/2021)   Not Taking   LORazepam (ATIVAN) 0.5 MG tablet Take 0.5 mg by mouth at bedtime. (Patient not taking: Reported on 07/10/2021)   Not Taking    Assessment: Patient admitted with acute encephalopathy, sepsis, COVID-19 infection, sacral decubitus ulcer/cellulitis. MD concerned for PE. Awaiting CT angiogram. Pharmacy asked to start heparin.  Initial heparin level 0.16, below goal.  No overt bleeding or complications noted.  No known issues with IV infusion.  Goal of Therapy:  Heparin level 0.3-0.7 units/ml Monitor platelets by anticoagulation protocol: Yes   Plan:  Increase IV heparin to 800 units/hr. Recheck heparin level in 8 hrs. Daily heparin level and CBC.  [place] Crane, Roylene Reason, BCCP Clinical Pharmacist  07/11/2021 9:02 PM   [facility] pharmacy phone numbers are listed on [email or web address]

## 2021-07-11 NOTE — Progress Notes (Signed)
  Speech Language Pathology Treatment: Dysphagia  Patient Details Name: Taylor Andrews MRN: [id number] DOB: December 12, 1933 Today's Date: 07/11/2021 Time: [id number] SLP Time Calculation (min) (ACUTE ONLY): 19 min  Assessment / Plan / Recommendation Clinical Impression  Ongoing diagnostic dysphagia therapy provided this date; Pt was alert but kept her eyes closed throughout session. Pt was unable to suck through the straw this am, however, with cup presentation she took sips without overt s/sx of aspiration requiring verbal cues and tactile support. She consumed 3 oz of applesauce without difficulty. Continue to note some white residue/build up on lingual surface. Pt continues to require 1:1 feeder with strict aspiration precautions; further please ensure Pt is alert and responsive when PO is presented. Recommend continue with puree diet and thin liquids; recommend meds be crushed with puree. ST will continue to follow acutely.    HPI HPI: Taylor Andrews is a 87 y.o. BF PMHx dementia,, hypertension, DM type II controlled, fatty liver disease,  subdural hematoma 2015. Presented ED from nursing home with reports of altered mental status and cough.  At the time of my evaluation, patient is somnolent, not answering questions.  Patient was brought to the ED with reports of cough, change in mental status. Hospitalization 8/14-8/16 for dehydration and possible sepsis with increased procalcitonin, patient was started on Vanco and cefepime, brain MRI was unremarkable. COVID test later resulted positive. BSE requested.      SLP Plan  Continue with current plan of care       Recommendations  Diet recommendations: Dysphagia 1 (puree);Thin liquid Liquids provided via: Cup Medication Administration: Crushed with puree Supervision: Full supervision/cueing for compensatory strategies Compensations: Slow rate;Small sips/bites Postural Changes and/or Swallow Maneuvers: Seated upright 90 degrees;Upright 30-60 min after  meal                Oral Care Recommendations: Staff/trained caregiver to provide oral care;Oral care BID Follow up Recommendations: 24 hour supervision/assistance SLP Visit Diagnosis: Dysphagia, unspecified (R13.10) Plan: Continue with current plan of care       Leticia Coletta H. Roddie Mc, [place] Speech Language Pathologist        Wende Bushy 07/11/2021, 10:10 AM

## 2021-07-11 NOTE — Progress Notes (Signed)
ANTICOAGULATION CONSULT NOTE - Initial Consult  Pharmacy Consult for Heparin Indication: pulmonary embolus  Allergies  Allergen Reactions   Milk-Related Compounds Other (See Comments)    Lactose Intolerant    Iohexol Other (See Comments)     Desc: PT STATES THAT THE CONTRAST THAT SHE HAD IN 2004 MADE HER SICK, PT COULD NOT SPECIFY REACTION TYPES     Patient Measurements: Height: '5\' 1"'$  (154.9 cm) Weight: 43.4 kg (95 lb 10.9 oz) IBW/kg (Calculated) : 47.8 HEPARIN DW (KG): 42.6   Vital Signs: Temp: 98.6 F (37 C) (09/06 0800) Temp Source: Axillary (09/06 0800) BP: 134/84 (09/06 0800) Pulse Rate: 89 (09/06 0800)  Labs: Recent Labs    07/09/21 1628 07/09/21 1801 07/10/21 0401 07/11/21 0442  HGB 12.0  --  9.9* 10.0*  HCT 38.6  --  32.0* 32.3*  PLT 365  --  276 242  CREATININE 1.07*  --  0.73 0.61  TROPONINIHS 14 15  --   --     Estimated Creatinine Clearance: 35.9 mL/min (by C-G formula based on SCr of 0.61 mg/dL).   Medical History: Past Medical History:  Diagnosis Date   Adenomatous colon polyp    carcinoma in situ   AMS (altered mental status) 07/09/2021   Chronic abdominal pain    Dementia (HCC)    Diabetes mellitus    Fatty liver    on [place] normal LFT'S   GERD (gastroesophageal reflux disease)    Hyperlipidemia    Hypertension    Iron deficiency    Chronic current parameter normal; 4/09 iron was 12 sat 5%   SDH (subdural hematoma) ([place]) 01/2014   chronic    Medications:  Medications Prior to Admission  Medication Sig Dispense Refill Last Dose   amLODipine (NORVASC) 10 MG tablet Take 10 mg by mouth daily.    07/09/2021   donepezil (ARICEPT) 10 MG tablet Take 10 mg by mouth daily.    07/09/2021   hydrochlorothiazide 25 MG tablet Take 25 mg by mouth daily.    07/09/2021   memantine (NAMENDA) 10 MG tablet Take 10 mg by mouth 2 (two) times daily.   07/09/2021   niacin (NIASPAN) 500 MG CR tablet Take 500 mg by mouth daily.    07/09/2021   Omega-3 Fatty Acids (FISH  OIL) 1000 MG CAPS Take 1 capsule by mouth 2 (two) times daily with a meal.    07/09/2021   oxybutynin (DITROPAN) 5 MG tablet Take 5 mg by mouth 2 (two) times daily.   07/09/2021   potassium chloride SA (K-DUR,KLOR-CON) 20 MEQ tablet Take 20 mEq by mouth daily.   07/09/2021   Probiotic Product (PROBIOTIC BLEND) CAPS Take 1 capsule by mouth daily.   07/09/2021   docusate sodium (COLACE) 100 MG capsule Take 100 mg by mouth 3 (three) times daily. (Patient not taking: Reported on 07/10/2021)   Not Taking   LORazepam (ATIVAN) 0.5 MG tablet Take 0.5 mg by mouth at bedtime. (Patient not taking: Reported on 07/10/2021)   Not Taking    Assessment: Patient admitted with acute encephalopathy, sepsis, COVID-19 infection, sacral decubitus ulcer/cellulitis. MD concerned for PE. Awaiting CT angiogram. Pharmacy asked to start heparin.  Goal of Therapy:  Heparin level 0.3-0.7 units/ml Monitor platelets by anticoagulation protocol: Yes   Plan:  Give 2000 units bolus x 1 Start heparin infusion at 700 units/hr Check anti-Xa level in ~8 hours and daily while on heparin Continue to monitor H&H and platelets  Isac Sarna, BS Pharm D, BCPS  Clinical Pharmacist Pager [phone] Isac Sarna L 07/11/2021,10:17 AM

## 2021-07-11 NOTE — Progress Notes (Signed)
RN unit called regarding PICC placement. RN made aware that PICC cannot be placed tonight. Will follow up in am.

## 2021-07-11 NOTE — Progress Notes (Signed)
Pharmacy Antibiotic Note  Taylor Andrews a 87 y.o. female admitted on 07/11/2021 with osteomyelitis suspected.  Pharmacy has been consulted for vancomycin and ciprofloxacin dosing. CrCl 54ms/min, COVID 19 infection, sacral decubitus ulcer/cellulitis Plan: Vancomycin '500mg'$  IV every 24 hours.  AUC 408 Change Ciprofloxacin '400mg'$  iv q12h F/U cxs and clinical progress Monitor V/S, labs and levels as indicated  Medical History: Past Medical History:  Diagnosis Date   Adenomatous colon polyp    carcinoma in situ   AMS (altered mental status) 07/09/2021   Chronic abdominal pain    Dementia (HCC)    Diabetes mellitus    Fatty liver    on [place] LFT'S   GERD (gastroesophageal reflux disease)    Hyperlipidemia    Hypertension    Iron deficiency    Chronic current parameter normal; 4/09 iron was 12 sat 5%   [place]   ch[place]    Allergies:  Allergies  Allergen Reactions   Milk-Related Compounds Other (See Comments)    Lactose Intolerant    Iohexol Other (See Comments)     Desc: PT STATES THAT THE CONTRAST THAT SHE HAD IN 2004 MADE HER SICK, PT COULD NOT SPECIFY REACTION TYPES     Filed Weights   07/09/21 1620 07/09/21 2249 07/11/21 0500  Weight: 54.4 kg (119 lb 14.9 oz) 42.6 kg (93 lb 14.7 oz) 43.4 kg (95 lb 10.9 oz)    CBC Latest Ref Rng & Units 07/11/2021 07/10/2021 07/09/2021  WBC 4.0 - 10.5 K/uL 19.5(H) 16.6(H) 20.8(H)  Hemoglobin 12.0 - 15.0 g/dL 10.0(L) 9.9(L) 12.0  Hematocrit 36.0 - 46.0 % 32.3(L) 32.0(L) 38.6  Platelets 150 - 400 K/uL 242 276 365     Estimated Creatinine Clearance: 35.9 mL/min (by C-G formula based on SCr of 0.61 mg/dL).  Antibiotics Given (last 72 hours)     Date/Time Action Medication Dose Rate   07/09/21 1817 New Bag/Given   ceFEPIme (MAXIPIME) 2 g in sodium chloride 0.9 % 100 mL IVPB 2 g 200 mL/hr   07/09/21 1820 New Bag/Given   metroNIDAZOLE (FLAGYL) IVPB 500 mg 500 mg 100 mL/hr   07/09/21 1852 New Bag/Given    vancomycin (VANCOCIN) IVPB 1000 mg/200 mL premix 1,000 mg 200 mL/hr   07/10/21 1018 New Bag/Given   remdesivir 100 mg in sodium chloride 0.9 % 100 mL IVPB 100 mg 200 mL/hr   07/10/21 1059 New Bag/Given   remdesivir 100 mg in sodium chloride 0.9 % 100 mL IVPB 100 mg 200 mL/hr   07/10/21 1339 New Bag/Given   ciprofloxacin (CIPRO) IVPB 400 mg 400 mg 200 mL/hr   07/10/21 1701 New Bag/Given   vancomycin (VANCOREADY) IVPB 500 mg/100 mL 500 mg 100 mL/hr   07/10/21 2235 New Bag/Given   ciprofloxacin (CIPRO) IVPB 400 mg 400 mg 200 mL/hr   07/11/21 0539 New Bag/Given   ciprofloxacin (CIPRO) IVPB 400 mg 400 mg 200 mL/hr       Antimicrobials this admission:   Cefepime 9/4 >> 9/5 Ciprofloxacin 9/5 >>  vancomycin 9/4 >>  Metronidazole 9/4  x 1   Microbiology results: 9/4 BCx: ngtd 9/4 UCx: sent 9/4 Resp Panel: covid positive  9/4 MRSA PCR: negative   Thank you for allowing pharmacy to be a part of this patient's care.  LIsac Sarna BS PVena [place] BCPS Clinical Pharmacist Pager #[phone]

## 2021-07-12 ENCOUNTER — Inpatient Hospital Stay (HOSPITAL_COMMUNITY): Payer: Medicare Other

## 2021-07-12 DIAGNOSIS — I5021 Acute systolic (congestive) heart failure: Secondary | ICD-10-CM | POA: Diagnosis not present

## 2021-07-12 LAB — BASIC METABOLIC PANEL
Anion gap: 3 — ABNORMAL LOW (ref 5–15)
BUN: 17 mg/dL (ref 8–23)
CO2: 25 mmol/L (ref 22–32)
Calcium: 7.3 mg/dL — ABNORMAL LOW (ref 8.9–10.3)
Chloride: 108 mmol/L (ref 98–111)
Creatinine, Ser: 0.58 mg/dL (ref 0.44–1.00)
GFR, Estimated: >60 mL/min (ref >60)
Glucose, Bld: 185 mg/dL — ABNORMAL HIGH (ref 70–99)
Potassium: 3.5 mmol/L (ref 3.5–5.1)
Sodium: 136 mmol/L (ref 135–145)

## 2021-07-12 LAB — CBC WITH DIFFERENTIAL/PLATELET
Abs Immature Granulocytes: 0.32 10*3/uL — ABNORMAL HIGH (ref 0.00–0.07)
Basophils Absolute: 0.1 10*3/uL (ref 0.0–0.1)
Basophils Relative: 0 %
Eosinophils Absolute: 0 10*3/uL (ref 0.0–0.5)
Eosinophils Relative: 0 %
HCT: 26.6 % — ABNORMAL LOW (ref 36.0–46.0)
Hemoglobin: 8.7 g/dL — ABNORMAL LOW (ref 12.0–15.0)
Immature Granulocytes: 1 %
Lymphocytes Relative: 4 %
Lymphs Abs: 0.9 10*3/uL (ref 0.7–4.0)
MCH: 28.4 pg (ref 26.0–34.0)
MCHC: 32.7 g/dL (ref 30.0–36.0)
MCV: 86.9 fL (ref 80.0–100.0)
Monocytes Absolute: 1.1 10*3/uL — ABNORMAL HIGH (ref 0.1–1.0)
Monocytes Relative: 4 %
Neutro Abs: 21.9 10*3/uL — ABNORMAL HIGH (ref 1.7–7.7)
Neutrophils Relative %: 91 %
Platelets: 299 10*3/uL (ref 150–400)
RBC: 3.06 MIL/uL — ABNORMAL LOW (ref 3.87–5.11)
RDW: 13.6 % (ref 11.5–15.5)
WBC: 24.2 10*3/uL — ABNORMAL HIGH (ref 4.0–10.5)
nRBC: 0 % (ref 0.0–0.2)

## 2021-07-12 LAB — COMPREHENSIVE METABOLIC PANEL
ALT: 15 U/L (ref 0–44)
AST: 15 U/L (ref 15–41)
Albumin: 1.9 g/dL — ABNORMAL LOW (ref 3.5–5.0)
Alkaline Phosphatase: 56 U/L (ref 38–126)
Anion gap: 6 (ref 5–15)
BUN: 15 mg/dL (ref 8–23)
CO2: 26 mmol/L (ref 22–32)
Calcium: 7.7 mg/dL — ABNORMAL LOW (ref 8.9–10.3)
Chloride: 107 mmol/L (ref 98–111)
Creatinine, Ser: 0.55 mg/dL (ref 0.44–1.00)
GFR, Estimated: >60 mL/min (ref >60)
Glucose, Bld: 137 mg/dL — ABNORMAL HIGH (ref 70–99)
Potassium: 2.7 mmol/L — CL (ref 3.5–5.1)
Sodium: 139 mmol/L (ref 135–145)
Total Bilirubin: 0.4 mg/dL (ref 0.3–1.2)
Total Protein: 5.2 g/dL — ABNORMAL LOW (ref 6.5–8.1)

## 2021-07-12 LAB — FERRITIN: Ferritin: 606 ng/mL — ABNORMAL HIGH (ref 11–307)

## 2021-07-12 LAB — ECHOCARDIOGRAM COMPLETE
AR max vel: 1.39 cm2
AV Area VTI: 1.53 cm2
AV Area mean vel: 1.31 cm2
AV Mean grad: 6.5 mmHg
AV Peak grad: 15.6 mmHg
Ao pk vel: 1.98 m/s
Area-P 1/2: 2.47 cm2
Height: 61 in
P 1/2 time: 295 msec
S' Lateral: 1.8 cm
Weight: 1530.87 oz

## 2021-07-12 LAB — PHOSPHORUS: Phosphorus: 2.3 mg/dL — ABNORMAL LOW (ref 2.5–4.6)

## 2021-07-12 LAB — HEPARIN LEVEL (UNFRACTIONATED)
Heparin Unfractionated: 0.19 IU/mL — ABNORMAL LOW (ref 0.30–0.70)
Heparin Unfractionated: 0.18 IU/mL — ABNORMAL LOW (ref 0.30–0.70)

## 2021-07-12 LAB — LACTIC ACID, PLASMA
Lactic Acid, Venous: 1.3 mmol/L (ref 0.5–1.9)
Lactic Acid, Venous: 1.8 mmol/L (ref 0.5–1.9)

## 2021-07-12 LAB — GLUCOSE, CAPILLARY
Glucose-Capillary: 105 mg/dL — ABNORMAL HIGH (ref 70–99)
Glucose-Capillary: 118 mg/dL — ABNORMAL HIGH (ref 70–99)
Glucose-Capillary: 135 mg/dL — ABNORMAL HIGH (ref 70–99)
Glucose-Capillary: 139 mg/dL — ABNORMAL HIGH (ref 70–99)
Glucose-Capillary: 168 mg/dL — ABNORMAL HIGH (ref 70–99)
Glucose-Capillary: 172 mg/dL — ABNORMAL HIGH (ref 70–99)
Glucose-Capillary: 178 mg/dL — ABNORMAL HIGH (ref 70–99)

## 2021-07-12 LAB — PROCALCITONIN: Procalcitonin: 0.56 ng/mL

## 2021-07-12 LAB — D-DIMER, QUANTITATIVE: D-Dimer, Quant: 3.71 ug/mL-FEU — ABNORMAL HIGH (ref 0.00–0.50)

## 2021-07-12 LAB — C-REACTIVE PROTEIN: CRP: 11.4 mg/dL — ABNORMAL HIGH (ref <1.0)

## 2021-07-12 LAB — VITAMIN B12: Vitamin B-12: 660 pg/mL (ref 180–914)

## 2021-07-12 LAB — LACTATE DEHYDROGENASE: LDH: 173 U/L (ref 98–192)

## 2021-07-12 LAB — MAGNESIUM: Magnesium: 2.1 mg/dL (ref 1.7–2.4)

## 2021-07-12 LAB — TSH: TSH: 0.338 u[IU]/mL — ABNORMAL LOW (ref 0.350–4.500)

## 2021-07-12 MED ORDER — SODIUM PHOSPHATES 45 MMOLE/15ML IV SOLN: 30.0000 mmol | Freq: Once | INTRAVENOUS | Status: DC

## 2021-07-12 MED ORDER — POTASSIUM PHOSPHATES 15 MMOLE/5ML IV SOLN
15.0000 mmol | Freq: Once | INTRAVENOUS | Status: AC
  Administered 2021-07-12: 15 mmol via INTRAVENOUS
  Filled 2021-07-12: qty 5

## 2021-07-12 MED ORDER — SODIUM CHLORIDE 0.9% FLUSH
10.0000 mL | Freq: Two times a day (BID) | INTRAVENOUS | Status: DC
Start: 2021-07-12 — End: 2021-07-20
  Administered 2021-07-12 – 2021-07-13 (×3): 10 mL
  Administered 2021-07-14: 40 mL
  Administered 2021-07-14 – 2021-07-17 (×6): 10 mL
  Administered 2021-07-17: 20 mL
  Administered 2021-07-18 – 2021-07-20 (×5): 10 mL

## 2021-07-12 MED ORDER — SENNOSIDES-DOCUSATE SODIUM 8.6-50 MG PO TABS: 1.0000 | ORAL_TABLET | Freq: Every evening | ORAL | Status: DC | PRN

## 2021-07-12 MED ORDER — HEPARIN BOLUS VIA INFUSION
1000.0000 [IU] | Freq: Once | INTRAVENOUS | Status: AC
  Administered 2021-07-12: 1000 [IU] via INTRAVENOUS
  Filled 2021-07-12: qty 1000

## 2021-07-12 MED ORDER — TRAZODONE HCL 50 MG PO TABS
50.0000 mg | ORAL_TABLET | Freq: Every evening | ORAL | Status: DC | PRN
  Administered 2021-07-12: 50 mg via ORAL
  Filled 2021-07-12: qty 1

## 2021-07-12 MED ORDER — SODIUM CHLORIDE 0.9% FLUSH: 10.0000 mL | INTRAVENOUS | Status: DC | PRN

## 2021-07-12 MED ORDER — HEPARIN BOLUS VIA INFUSION
1250.0000 [IU] | Freq: Once | INTRAVENOUS | Status: AC
  Administered 2021-07-12: 1250 [IU] via INTRAVENOUS
  Filled 2021-07-12: qty 1250

## 2021-07-12 MED ORDER — IPRATROPIUM-ALBUTEROL 20-100 MCG/ACT IN AERS
1.0000 | INHALATION_SPRAY | Freq: Two times a day (BID) | RESPIRATORY_TRACT | Status: DC
  Administered 2021-07-12 – 2021-07-13 (×3): 1 via RESPIRATORY_TRACT

## 2021-07-12 MED ORDER — IPRATROPIUM-ALBUTEROL 20-100 MCG/ACT IN AERS
1.0000 | INHALATION_SPRAY | Freq: Four times a day (QID) | RESPIRATORY_TRACT | Status: DC
  Administered 2021-07-12: 1 via RESPIRATORY_TRACT
  Filled 2021-07-12: qty 4

## 2021-07-12 MED ORDER — DM-GUAIFENESIN ER 30-600 MG PO TB12: 1.0000 | ORAL_TABLET | Freq: Two times a day (BID) | ORAL | Status: DC | PRN

## 2021-07-12 MED ORDER — ACETAMINOPHEN 325 MG PO TABS: 650.0000 mg | ORAL_TABLET | Freq: Four times a day (QID) | ORAL | Status: DC | PRN

## 2021-07-12 MED ORDER — CHLORHEXIDINE GLUCONATE CLOTH 2 % EX PADS
6.0000 | MEDICATED_PAD | Freq: Every day | CUTANEOUS | Status: DC
  Administered 2021-07-12 – 2021-07-19 (×8): 6 via TOPICAL

## 2021-07-12 MED ORDER — COLLAGENASE 250 UNIT/GM EX OINT
TOPICAL_OINTMENT | Freq: Every day | CUTANEOUS | Status: DC
  Administered 2021-07-12 – 2021-07-19 (×4): 1 via TOPICAL
  Filled 2021-07-12: qty 30

## 2021-07-12 MED ORDER — POTASSIUM CHLORIDE 10 MEQ/100ML IV SOLN
10.0000 meq | INTRAVENOUS | Status: AC
Start: 2021-07-12 — End: 2021-07-13
  Administered 2021-07-12 (×6): 10 meq via INTRAVENOUS
  Filled 2021-07-12 (×4): qty 100

## 2021-07-12 NOTE — Progress Notes (Signed)
*  PRELIMINARY RESULTS* Echocardiogram 2D Echocardiogram has been performed.  Taylor Andrews 07/12/2021, 9:44 AM

## 2021-07-12 NOTE — Progress Notes (Addendum)
[place] for IV Heparin Indication: pulmonary embolus  Allergies  Allergen Reactions   Milk-Related Compounds Other (See Comments)    Lactose Intolerant    Iohexol Other (See Comments)     Desc: PT STATES THAT THE CONTRAST THAT SHE HAD IN 2004 MADE HER SICK, PT COULD NOT SPECIFY REACTION TYPES     Patient Measurements: Height: '5\' 1"'$  (154.9 cm) Weight: 43.4 kg (95 lb 10.9 oz) IBW/kg (Calculated) : 47.8 HEPARIN DW (KG): 42.6   Vital Signs: Temp: 98 F (36.7 C) (09/07 1600) Temp Source: Axillary (09/07 1600) BP: 128/46 (09/07 1800) Pulse Rate: 75 (09/07 1800)  Labs: Recent Labs    07/10/21 0401 07/11/21 0442 07/11/21 2029 07/12/21 0500 07/12/21 0619 07/12/21 1715  HGB 9.9* 10.0*  --  8.7*  --   --   HCT 32.0* 32.3*  --  26.6*  --   --   PLT 276 242  --  299  --   --   HEPARINUNFRC  --   --  0.16*  --  0.19* 0.18*  CREATININE 0.73 0.61  --  0.55  --  0.58    Estimated Creatinine Clearance: 35.9 mL/min (by C-G formula based on SCr of 0.58 mg/dL).   Medical History: Past Medical History:  Diagnosis Date   Adenomatous colon polyp    carcinoma in situ   AMS (altered mental status) 07/09/2021   Chronic abdominal pain    Dementia (HCC)    Diabetes mellitus    Fatty liver    on [place] normal LFT'S   GERD (gastroesophageal reflux disease)    Hyperlipidemia    Hypertension    Iron deficiency    Chronic current parameter normal; 4/09 iron was 12 sat 5%   SDH (subdural hematoma) ([place]) 01/2014   chronic    Assessment: 87 yr old woman was admitted with acute encephalopathy, sepsis, COVID-19 infection, sacral decubitus ulcer/cellulitis. MD concerned for PE. CT angiogram today showed large R-sided PE with evidence of R heart strain, consistent with at least submassive PE.  Dopplers showed age-indeterrminate thrombus in LLE (suggestive for chronic DVT, rather than acute DVT). D-dimer >20.0 earlier today, now 3.71. Pharmacy was consulted to  dose IV heparin. Pt was not on anticoagulant PTA.  Heparin level ~8.5 hrs after heparin 1000 units IV bolus, followed by increasing heparin infusion to 900 units/hr, was 0.18 units/ml, which is below the goal range for this pt. H/H 8.7/26.6, plt 299. Per RN, no issues with IV or bleeding observed.   Goal of Therapy:  Heparin level 0.3-0.7 units/ml Monitor platelets by anticoagulation protocol: Yes   Plan:  Heparin 1250 units IV bolus X 1 Increase heparin infusion to 1050 units/hr Check heparin level in 8 hrs Monitor daily heparin level and CBC Monitor for bleeding F/U transition to oral anticoagulant when able  Gillermina Hu, PharmD, BCPS, [facility] Clinical Pharmacist  07/12/2021 7:07 PM

## 2021-07-12 NOTE — Progress Notes (Signed)
Peripherally Inserted Central Catheter Placement  The IV Nurse has discussed with the patient and/or persons authorized to consent for the patient, the purpose of this procedure and the potential benefits and risks involved with this procedure.  The benefits include less needle sticks, lab draws from the catheter, and the patient may be discharged home with the catheter. Risks include, but not limited to, infection, bleeding, blood clot (thrombus formation), and puncture of an artery; nerve damage and irregular heartbeat and possibility to perform a PICC exchange if needed/ordered by physician.  Alternatives to this procedure were also discussed.  B[employer] Power PICC patient education guide, fact sheet on infection prevention and patient information card has been provided to patient /or left at bedside.    PICC Placement Documentation  PICC Double Lumen 07/12/21 PICC Left Brachial 36 cm 3 cm (Active)  Indication for Insertion or Continuance of Line Poor Vasculature-patient has had multiple peripheral attempts or PIVs lasting less than 24 hours 07/12/21 1357  Exposed Catheter (cm) 3 cm 07/12/21 1357  Site Assessment Clean;Dry;Intact 07/12/21 1357  Lumen #1 Status Flushed;Blood return noted 07/12/21 1357  Lumen #2 Status Flushed;Blood return noted 07/12/21 1357  Dressing Type Transparent 07/12/21 1357  Dressing Status Clean;Dry;Intact 07/12/21 1357  Antimicrobial disc in place? Yes 07/12/21 1357  Dressing Intervention New dressing;Other (Comment) 07/12/21 1357  Dressing Change Due 07/19/21 07/12/21 1357   Telephone consent signed by Niece    Synthia Innocent 07/12/2021, 1:59 PM

## 2021-07-12 NOTE — Progress Notes (Signed)
PROGRESS NOTE    Taylor Andrews  [id number] DOB: [birth year] DOA: 07/09/2021 PCP: [employer]mond [employer], MD   Brief Narrative:  87 y.o. BF PMHx dementia,, hypertension, DM type II controlled, fatty liver disease,  subdural hematoma 2015 came from nursing home for altered mental status and cough.  Admitted 3 weeks ago for dehydration with possible sepsis initially on vancomycin and cefepime, MRI brain was unremarkable, cultures were negative and was discharged on antibiotics.  Admitted to the hospital for sepsis secondary to [id number] infection complicated by pulmonary embolism.     Assessment & Plan:   Principal Problem:   Acute encephalopathy Active Problems:   Dementia (HCC)   HTN (hypertension)   DM (diabetes mellitus), type 2 (HCC)   COVID-19 virus infection   Severe sepsis (HCC)   AMS (altered mental status)   Decubitus ulcer of sacral region, unstageable ([place])   Sacral osteomyelitis ([place])   Cellulitis of sacral region   Left leg DVT ([place])   Pulmonary embolus (HCC)  Severe sepsis secondary to COVID-19 infection - Sepsis physiology improving.  Underlying COVID-19 infection and cellulitis/infected sacral decubitus ulcer. - PICC line- -Remdesivir-day 3/5 - Solu-Medrol 60 mg twice daily-D2 - I-S/flutter valve, bronchodilators, vitamins -Procalcitonin 0.56  Right large pulmonary embolism with cor pulmonale, submassive PE Bilateral lower extremity DVT -Suspect from COVID-19 - On heparin drip - Lower extremity Dopplers-positive for bilateral DVT - Echocardiogram  Hypokalemia - Repletion  Unstageable sacral decubitus ulcer Pressure Injury 07/10/21 Coccyx Medial Unstageable - Full thickness tissue loss in which the base of the injury is covered by slough (yellow, tan, gray, green or brown) and/or eschar (tan, brown or black) in the wound bed. (Active)  07/10/21 0000  Location: Coccyx  Location Orientation: Medial  Staging: Unstageable - Full thickness tissue loss in which the  base of the injury is covered by slough (yellow, tan, gray, green or brown) and/or eschar (tan, brown or black) in the wound bed.  Wound Description (Comments):   Present on Admission: Yes  -Currently on vancomycin and ciprofloxacin  Acute metabolic encephalopathy History of dementia - At baseline can feed herself and occasionally recognize family members but unable to answer all questions.  Currently unable to do these things as well - History improved with underlying infection - CT head negative - Check TSH, B12, folate  Essential hypertension - Home meds on hold  Diabetes mellitus type 2 - Sliding scale and Accu-Chek.  A1c 5.2     DVT prophylaxis: Heparin drip Code Status: DNR Family Communication:    Status is: Inpatient  Remains inpatient appropriate because:Inpatient level of care appropriate due to severity of illness  Dispo: The patient is from: Home              Anticipated d/c is to: Home              Patient currently is not medically stable to d/c.  Continue heparin drip due to significantly large clot burden.   Difficult to place patient No       Nutritional status  Nutrition Problem: Increased nutrient needs Etiology: wound healing  Signs/Symptoms: estimated needs  Interventions: Ensure Enlive (each supplement provides 350kcal and 20 grams of protein), MVI, Magic cup  Body mass index is 18.08 kg/m.      Subjective: Sitting in the bed, does have any complaints at this time.  Review of Systems Otherwise negative except as per HPI, including: General: Denies fever, chills, night sweats or unintended weight loss. Resp: Denies cough,  wheezing, shortness of breath. Cardiac: Denies chest pain, palpitations, orthopnea, paroxysmal nocturnal dyspnea. GI: Denies abdominal pain, nausea, vomiting, diarrhea or constipation GU: Denies dysuria, frequency, hesitancy or incontinence MS: Denies muscle aches, joint pain or swelling Neuro: Denies headache,  neurologic deficits (focal weakness, numbness, tingling), abnormal gait Psych: Denies anxiety, depression, SI/HI/AVH Skin: Denies new rashes or lesions ID: Denies sick contacts, exotic exposures, travel  Examination:  General exam: Appears calm and comfortable, elderly frail Respiratory system: Clear to auscultation. Respiratory effort normal. Cardiovascular system: S1 & S2 heard, RRR. No JVD, murmurs, rubs, gallops or clicks. No pedal edema. Gastrointestinal system: Abdomen is nondistended, soft and nontender. No organomegaly or masses felt. Normal bowel sounds heard. Central nervous system: Alert and oriented. No focal neurological deficits. Extremities: Symmetric 5 x 5 power. Skin: No rashes, lesions or ulcers Psychiatry: Judgement and insight appear normal. Mood & affect appropriate.     Objective: Vitals:   07/12/21 0000 07/12/21 0200 07/12/21 0400 07/12/21 0600  BP: (!) 131/33 (!) 150/42 137/62 (!) 153/38  Pulse: 69 65 73 67  Resp: '14 14 20 15  '$ Temp:   (!) 97.5 F (36.4 C)   TempSrc:   Axillary   SpO2: 99% 100% 100% 100%  Weight:      Height:        Intake/Output Summary (Last 24 hours) at 07/12/2021 0804 Last data filed at 07/12/2021 0536 Gross per 24 hour  Intake 2076.26 ml  Output 2200 ml  Net -123.74 ml   Filed Weights   07/09/21 1620 07/09/21 2249 07/11/21 0500  Weight: 54.4 kg 42.6 kg 43.4 kg     Data Reviewed:   CBC: Recent Labs  Lab 07/09/21 1628 07/10/21 0401 07/11/21 0442 07/12/21 0500  WBC 20.8* 16.6* 19.5* 24.2*  NEUTROABS 18.7* 13.6* 17.5* 21.9*  HGB 12.0 9.9* 10.0* 8.7*  HCT 38.6 32.0* 32.3* 26.6*  MCV 91.9 91.2 91.0 86.9  PLT 365 276 242 [id number]   Basic Metabolic Panel: Recent Labs  Lab 07/09/21 1628 07/10/21 0401 07/11/21 0442 07/12/21 0500  NA 143 143 142 139  K 3.8 3.5 3.3* 2.7*  CL 109 107 107 107  CO2 '27 30 25 26  '$ GLUCOSE 254* 141* 83 137*  BUN 29* '20 17 15  '$ CREATININE 1.07* 0.73 0.61 0.55  CALCIUM 8.7* 8.6* 8.4* 7.7*  MG   --  2.3 2.3 2.1  PHOS  --  2.3* 2.6 2.3*   GFR: Estimated Creatinine Clearance: 35.9 mL/min (by C-G formula based on SCr of 0.55 mg/dL). Liver Function Tests: Recent Labs  Lab 07/09/21 1628 07/10/21 0401 07/11/21 0442 07/12/21 0500  AST '30 24 22 15  '$ ALT '20 17 16 15  '$ ALKPHOS 74 56 70 56  BILITOT 0.5 0.5 0.2* 0.4  PROT 7.7 6.0* 6.1* 5.2*  ALBUMIN 2.8* 2.2* 2.1* 1.9*   No results for input(s): LIPASE, AMYLASE in the last 168 hours. No results for input(s): AMMONIA in the last 168 hours. Coagulation Profile: No results for input(s): INR, PROTIME in the last 168 hours. Cardiac Enzymes: No results for input(s): CKTOTAL, CKMB, CKMBINDEX, TROPONINI in the last 168 hours. BNP (last 3 results) No results for input(s): PROBNP in the last 8760 hours. HbA1C: No results for input(s): HGBA1C in the last 72 hours. CBG: Recent Labs  Lab 07/11/21 1323 07/11/21 1641 07/11/21 1959 07/11/21 2333 07/12/21 0403  GLUCAP 168* 98 104* 172* 105*   Lipid Profile: No results for input(s): CHOL, HDL, LDLCALC, TRIG, CHOLHDL, LDLDIRECT in the last 72 hours.  Thyroid Function Tests: No results for input(s): TSH, T4TOTAL, FREET4, T3FREE, THYROIDAB in the last 72 hours. Anemia Panel: Recent Labs    07/11/21 0515 07/12/21 0619  FERRITIN 625* 606*   Sepsis Labs: Recent Labs  Lab 07/09/21 1801 07/10/21 0401 07/10/21 0903 07/10/21 1503 07/11/21 0442 07/11/21 2029 07/11/21 2338 07/12/21 0239 07/12/21 0500  PROCALCITON 1.17 0.85  --   --  1.02  --   --   --  0.56  LATICACIDVEN  --   --    < > 3.0*  --  1.2 1.3 1.8  --    < > = values in this interval not displayed.    Recent Results (from the past 240 hour(s))  Resp Panel by RT-PCR (Flu A&B, Covid) Nasopharyngeal Swab     Status: Abnormal   Collection Time: 07/09/21  5:41 PM   Specimen: Nasopharyngeal Swab; Nasopharyngeal(NP) swabs in vial transport medium  Result Value Ref Range Status   SARS Coronavirus 2 by RT PCR POSITIVE (A)  NEGATIVE Final    Comment: CRITICAL RESULT CALLED TO, READ BACK BY AND VERIFIED WITH: Stacy 07/09/2021 COLEMAN,R (NOTE) SARS-CoV-2 target nucleic acids are DETECTED.  The SARS-CoV-2 RNA is generally detectable in upper respiratory specimens during the acute phase of infection. Positive results are indicative of the presence of the identified virus, but do not rule out bacterial infection or co-infection with other pathogens not detected by the test. Clinical correlation with patient history and other diagnostic information is necessary to determine patient infection status. The expected result is Negative.  Fact Sheet for Patients: EntrepreneurPulse.com.au  Fact Sheet for Healthcare Providers: IncredibleEmployment.be  This test is not yet approved or cleared by the [place] FDA and  has been authorized for detection and/or diagnosis of SARS-CoV-2 by FDA under an Emergency Use Authorization (EUA).  This EUA will remain in effect (meaning this t est can be used) for the duration of  the COVID-19 declaration under Section 564(b)(1) of the Act, 21 U.S.C. section 360bbb-3(b)(1), unless the authorization is terminated or revoked sooner.     Influenza A by PCR NEGATIVE NEGATIVE Final   Influenza B by PCR NEGATIVE NEGATIVE Final    Comment: (NOTE) The Xpert Xpress SARS-CoV-2/FLU/RSV plus assay is intended as an aid in the diagnosis of influenza from Nasopharyngeal swab specimens and should not be used as a sole basis for treatment. Nasal washings and aspirates are unacceptable for Xpert Xpress SARS-CoV-2/FLU/RSV testing.  Fact Sheet for Patients: EntrepreneurPulse.com.au  Fact Sheet for Healthcare Providers: IncredibleEmployment.be  This test is not yet approved or cleared by the [place] FDA and has been authorized for detection and/or diagnosis of SARS-CoV-2 by FDA under an Emergency Use  Authorization (EUA). This EUA will remain in effect (meaning this test can be used) for the duration of the COVID-19 declaration under Section 564(b)(1) of the Act, 21 U.S.C. section 360bbb-3(b)(1), unless the authorization is terminated or revoked.  Performed at [facility], [address], [place], [place] [address]   Culture, blood (routine x 2)     Status: None (Preliminary result)   Collection Time: 07/09/21  5:50 PM   Specimen: Right Antecubital; Blood  Result Value Ref Range Status   Specimen Description RIGHT ANTECUBITAL  Final   Special Requests   Final    BOTTLES DRAWN AEROBIC AND ANAEROBIC Blood Culture adequate volume   Culture   Final    NO GROWTH 3 DAYS Performed at [facility], [address], [place], [place] [address]  Report Status PENDING  Incomplete  Culture, blood (routine x 2)     Status: None (Preliminary result)   Collection Time: 07/09/21  6:14 PM   Specimen: BLOOD RIGHT ARM  Result Value Ref Range Status   Specimen Description BLOOD RIGHT ARM  Final   Special Requests   Final    BOTTLES DRAWN AEROBIC ONLY Blood Culture adequate volume   Culture   Final    NO GROWTH 3 DAYS Performed at [facility], [address] [place], [place] [address]    Report Status PENDING  Incomplete  MRSA Next Gen by PCR, Nasal     Status: None   Collection Time: 07/09/21 10:44 PM   Specimen: Nasal Mucosa; Nasal Swab  Result Value Ref Range Status   MRSA by PCR Next Gen NOT DETECTED NOT DETECTED Final    Comment: (NOTE) The GeneXpert MRSA Assay (FDA approved for NASAL specimens only), is one component of a comprehensive MRSA colonization surveillance program. It is not intended to diagnose MRSA infection nor to guide or monitor treatment for MRSA infections. Test performance is not FDA approved in patients less than 34 years old. Performed at [facility], [address], [place], [place] [address]          Radiology Studies: DG Sacrum/Coccyx  Result Date:  07/10/2021 CLINICAL DATA:  Pressure ulcer at sacrum with yellowish discharge, dimension EXAM: SACRUM AND COCCYX - 2+ VIEW COMPARISON:  04/05/2009 FINDINGS: SI joints and sacral foramina preserved. Diffuse osseous demineralization. Dressing artifacts dorsal to sacrum. No sacrococcygeal fracture or definite bone destruction identified though the dorsal margin of the distal sacrum is very near the skin surface on lateral view. IMPRESSION: No definite bone destruction is seen to suggest sacrococcygeal osteomyelitis. Electronically Signed   By: Lavonia Dana M.D.   On: 07/10/2021 13:43   CT Angio Chest Pulmonary Embolism (PE) W or WO Contrast  Result Date: 07/11/2021 CLINICAL DATA:  Elevated D-dimer.  COVID-19. EXAM: CT ANGIOGRAPHY CHEST WITH CONTRAST TECHNIQUE: Multidetector CT imaging of the chest was performed using the standard protocol during bolus administration of intravenous contrast. Multiplanar CT image reconstructions and MIPs were obtained to evaluate the vascular anatomy. CONTRAST:  55m OMNIPAQUE IOHEXOL 350 MG/ML SOLN COMPARISON:  May 27, 2012. FINDINGS: Cardiovascular: Large linear filling defect is noted in distal right pulmonary artery which extends into the upper and lower lobe branches consistent with acute pulmonary embolus. RV/LV ratio of 1.4 is noted consistent with right heart strain. Pericardial effusion is noted. Atherosclerosis of thoracic aorta is noted without aneurysm or dissection. Mediastinum/Nodes: No enlarged mediastinal, hilar, or axillary lymph nodes. Thyroid gland, trachea, and esophagus demonstrate no significant findings. Lungs/Pleura: No pneumothorax or pleural effusion is noted. Minimal bilateral posterior basilar subsegmental atelectasis is noted. Multiple focal airspace opacities are noted in the right upper lobe which may represent multifocal pneumonia. Upper Abdomen: No acute abnormality. Musculoskeletal: No chest wall abnormality. No acute or significant osseous findings.  Review of the MIP images confirms the above findings. IMPRESSION: Large right-sided pulmonary embolus is noted which extends into upper and lower lobe branches. Positive for acute PE with CT evidence of right heart strain (RV/LV Ratio = 1.4) consistent with at least submassive (intermediate risk) PE. The presence of right heart strain has been associated with an increased risk of morbidity and mortality. Please refer to the "PE Focused" order set in EPIC. Critical Value/emergent results were called by telephone at the time of interpretation on 07/11/2021 at 2:35 pm to provider CURTIS  WOODS , who verbally acknowledged these results. Multiple small focal airspace opacities are noted in the right upper lobe consistent with multifocal pneumonia. Aortic Atherosclerosis (ICD10-I70.0). Electronically Signed   By: Marijo Conception M.D.   On: 07/11/2021 14:35   US Venous Img Lower Bilateral (DVT)  Result Date: 07/11/2021 CLINICAL DATA:  Elevated D-dimer EXAM: BILATERAL LOWER EXTREMITY VENOUS DOPPLER ULTRASOUND TECHNIQUE: Gray-scale sonography with graded compression, as well as color Doppler and duplex ultrasound were performed to evaluate the lower extremity deep venous systems from the level of the common femoral vein and including the common femoral, femoral, profunda femoral, popliteal and calf veins including the posterior tibial, peroneal and gastrocnemius veins when visible. The superficial great saphenous vein was also interrogated. Spectral Doppler was utilized to evaluate flow at rest and with distal augmentation maneuvers in the common femoral, femoral and popliteal veins. COMPARISON:  None. FINDINGS: RIGHT LOWER EXTREMITY Common Femoral Vein: No evidence of thrombus. Normal compressibility, respiratory phasicity and response to augmentation. Saphenofemoral Junction: No evidence of thrombus. Normal compressibility and flow on color Doppler imaging. Profunda Femoral Vein: No evidence of thrombus. Normal  compressibility and flow on color Doppler imaging. Femoral Vein: No evidence of thrombus. Normal compressibility, respiratory phasicity and response to augmentation. Popliteal Vein: No evidence of thrombus. Normal compressibility, respiratory phasicity and response to augmentation. Calf Veins: No evidence of thrombus. Normal compressibility and flow on color Doppler imaging. LEFT LOWER EXTREMITY Common Femoral Vein: No evidence of thrombus. Normal compressibility and color Doppler flow. Saphenofemoral Junction: No evidence of thrombus. Normal compressibility and flow on color Doppler imaging. Profunda Femoral Vein: No evidence of thrombus. Normal compressibility and flow on color Doppler imaging. Femoral Vein: Normal compressibility in the proximal and mid femoral vein. Incomplete compressibility of the distal left femoral vein. Wall thickening involving the mid and distal femoral vein. There is flow within the mid and distal femoral vein. Popliteal Vein: Incomplete compressibility in the left popliteal vein with wall thickening. Color Doppler flow in the left popliteal vein. Calf Veins: Limited evaluation. Other Findings:  None. IMPRESSION: 1. Age-indeterminate thrombus in the left lower extremity, involving the left femoral vein and left popliteal vein. Findings are suggestive for chronic DVT rather than acute DVT. 2.  Negative for deep venous thrombosis in right lower extremity. These results will be called to the ordering clinician or representative by the Radiologist Assistant, and communication documented in the PACS or [employer]. Electronically Signed   By: Markus Daft M.D.   On: 07/11/2021 12:49   [place] EKG SITE RITE  Result Date: 07/11/2021 If Site Rite image not attached, placement could not be confirmed due to current cardiac rhythm.       Scheduled Meds:  vitamin C  500 mg Oral BID   Chlorhexidine Gluconate Cloth  6 each Topical Daily   collagenase   Topical Daily   feeding supplement  1  Container Oral TID BM   insulin aspart  0-15 Units Subcutaneous Q4H   methylPREDNISolone (SOLU-MEDROL) injection  60 mg Intravenous Q12H   multivitamin-lutein  1 capsule Oral Daily   zinc sulfate  220 mg Oral Daily   Continuous Infusions:  sodium chloride Stopped (07/12/21 0532)   ciprofloxacin 200 mL/hr at 07/12/21 0536   heparin 800 Units/hr (07/12/21 0536)   lactated ringers     remdesivir 100 mg in NS 100 mL Stopped (07/11/21 0936)   vancomycin Stopped (07/10/21 1801)     LOS: 2 days   Time spent= 35 mins  Axten Pascucci Arsenio Loader, MD [facility]  If 7PM-7AM, please contact night-coverage  07/12/2021, 8:04 AM

## 2021-07-12 NOTE — Progress Notes (Signed)
ANTICOAGULATION CONSULT NOTE   Pharmacy Consult for Heparin Indication: pulmonary embolus  Allergies  Allergen Reactions   Milk-Related Compounds Other (See Comments)    Lactose Intolerant    Iohexol Other (See Comments)     Desc: PT STATES THAT THE CONTRAST THAT SHE HAD IN 2004 MADE HER SICK, PT COULD NOT SPECIFY REACTION TYPES     Patient Measurements: Height: '5\' 1"'$  (154.9 cm) Weight: 43.4 kg (95 lb 10.9 oz) IBW/kg (Calculated) : 47.8 HEPARIN DW (KG): 42.6   Vital Signs: Temp: 97.5 F (36.4 C) (09/07 0400) Temp Source: Axillary (09/07 0400) BP: 153/38 (09/07 0600) Pulse Rate: 67 (09/07 0600)  Labs: Recent Labs    07/09/21 1628 07/09/21 1801 07/10/21 0401 07/11/21 0442 07/11/21 2029 07/12/21 0500 07/12/21 0619  HGB 12.0  --  9.9* 10.0*  --  8.7*  --   HCT 38.6  --  32.0* 32.3*  --  26.6*  --   PLT 365  --  276 242  --  299  --   HEPARINUNFRC  --   --   --   --  0.16*  --  0.19*  CREATININE 1.07*  --  0.73 0.61  --  0.55  --   TROPONINIHS 14 15  --   --   --   --   --      Estimated Creatinine Clearance: 35.9 mL/min (by C-G formula based on SCr of 0.55 mg/dL).   Medical History: Past Medical History:  Diagnosis Date   Adenomatous colon polyp    carcinoma in situ   AMS (altered mental status) 07/09/2021   Chronic abdominal pain    Dementia (HCC)    Diabetes mellitus    Fatty liver    on [place] normal LFT'S   GERD (gastroesophageal reflux disease)    Hyperlipidemia    Hypertension    Iron deficiency    Chronic current parameter normal; 4/09 iron was 12 sat 5%   SDH (subdural hematoma) ([place]) 01/2014   chronic    Medications:  Medications Prior to Admission  Medication Sig Dispense Refill Last Dose   amLODipine (NORVASC) 10 MG tablet Take 10 mg by mouth daily.    07/09/2021   donepezil (ARICEPT) 10 MG tablet Take 10 mg by mouth daily.    07/09/2021   hydrochlorothiazide 25 MG tablet Take 25 mg by mouth daily.    07/09/2021   memantine (NAMENDA) 10 MG tablet  Take 10 mg by mouth 2 (two) times daily.   07/09/2021   niacin (NIASPAN) 500 MG CR tablet Take 500 mg by mouth daily.    07/09/2021   Omega-3 Fatty Acids (FISH OIL) 1000 MG CAPS Take 1 capsule by mouth 2 (two) times daily with a meal.    07/09/2021   oxybutynin (DITROPAN) 5 MG tablet Take 5 mg by mouth 2 (two) times daily.   07/09/2021   potassium chloride SA (K-DUR,KLOR-CON) 20 MEQ tablet Take 20 mEq by mouth daily.   07/09/2021   Probiotic Product (PROBIOTIC BLEND) CAPS Take 1 capsule by mouth daily.   07/09/2021   docusate sodium (COLACE) 100 MG capsule Take 100 mg by mouth 3 (three) times daily. (Patient not taking: Reported on 07/10/2021)   Not Taking   LORazepam (ATIVAN) 0.5 MG tablet Take 0.5 mg by mouth at bedtime. (Patient not taking: Reported on 07/10/2021)   Not Taking    Assessment: Patient admitted with acute encephalopathy, sepsis, COVID-19 infection, sacral decubitus ulcer/cellulitis. MD concerned for PE. Awaiting CT  angiogram. Pharmacy asked to start heparin.  Heparin level 0.19, below goal.  No overt bleeding or complications noted.  No known issues with IV infusion.    Goal of Therapy:  Heparin level 0.3-0.7 units/ml Monitor platelets by anticoagulation protocol: Yes   Plan:  Bolus heparin 1000 units Increase IV heparin to 900 units/hr. Recheck heparin level in 8 hrs. Daily heparin level and CBC.  Isac Sarna, BS Pharm D, BCPS Clinical Pharmacist Pager [phone]   07/12/2021 9:12 AM

## 2021-07-13 ENCOUNTER — Encounter (HOSPITAL_COMMUNITY): Payer: Self-pay | Admitting: Internal Medicine

## 2021-07-13 DIAGNOSIS — Z7189 Other specified counseling: Secondary | ICD-10-CM

## 2021-07-13 DIAGNOSIS — Z515 Encounter for palliative care: Secondary | ICD-10-CM

## 2021-07-13 LAB — COMPREHENSIVE METABOLIC PANEL
ALT: 16 U/L (ref 0–44)
ALT: 19 U/L (ref 0–44)
AST: 19 U/L (ref 15–41)
AST: 22 U/L (ref 15–41)
Albumin: 1.7 g/dL — ABNORMAL LOW (ref 3.5–5.0)
Albumin: 2 g/dL — ABNORMAL LOW (ref 3.5–5.0)
Alkaline Phosphatase: 54 U/L (ref 38–126)
Alkaline Phosphatase: 64 U/L (ref 38–126)
Anion gap: 3 — ABNORMAL LOW (ref 5–15)
Anion gap: 7 (ref 5–15)
BUN: 14 mg/dL (ref 8–23)
BUN: 15 mg/dL (ref 8–23)
CO2: 21 mmol/L — ABNORMAL LOW (ref 22–32)
CO2: 23 mmol/L (ref 22–32)
Calcium: 6.4 mg/dL — CL (ref 8.9–10.3)
Calcium: 7.8 mg/dL — ABNORMAL LOW (ref 8.9–10.3)
Chloride: 115 mmol/L — ABNORMAL HIGH (ref 98–111)
Chloride: 109 mmol/L (ref 98–111)
Creatinine, Ser: 0.48 mg/dL (ref 0.44–1.00)
Creatinine, Ser: 0.57 mg/dL (ref 0.44–1.00)
GFR, Estimated: >60 mL/min (ref >60)
GFR, Estimated: >60 mL/min (ref >60)
Glucose, Bld: 197 mg/dL — ABNORMAL HIGH (ref 70–99)
Glucose, Bld: 135 mg/dL — ABNORMAL HIGH (ref 70–99)
Potassium: 2.8 mmol/L — ABNORMAL LOW (ref 3.5–5.1)
Potassium: 3.8 mmol/L (ref 3.5–5.1)
Sodium: 139 mmol/L (ref 135–145)
Sodium: 139 mmol/L (ref 135–145)
Total Bilirubin: 0.2 mg/dL — ABNORMAL LOW (ref 0.3–1.2)
Total Bilirubin: 0.3 mg/dL (ref 0.3–1.2)
Total Protein: 4.7 g/dL — ABNORMAL LOW (ref 6.5–8.1)
Total Protein: 5.5 g/dL — ABNORMAL LOW (ref 6.5–8.1)

## 2021-07-13 LAB — BASIC METABOLIC PANEL
Anion gap: 8 (ref 5–15)
BUN: 13 mg/dL (ref 8–23)
CO2: 24 mmol/L (ref 22–32)
Calcium: 8.8 mg/dL — ABNORMAL LOW (ref 8.9–10.3)
Chloride: 110 mmol/L (ref 98–111)
Creatinine, Ser: 0.58 mg/dL (ref 0.44–1.00)
GFR, Estimated: >60 mL/min (ref >60)
Glucose, Bld: 132 mg/dL — ABNORMAL HIGH (ref 70–99)
Potassium: 3.2 mmol/L — ABNORMAL LOW (ref 3.5–5.1)
Sodium: 142 mmol/L (ref 135–145)

## 2021-07-13 LAB — CBC WITH DIFFERENTIAL/PLATELET
Abs Immature Granulocytes: 0.34 10*3/uL — ABNORMAL HIGH (ref 0.00–0.07)
Basophils Absolute: 0 10*3/uL (ref 0.0–0.1)
Basophils Relative: 0 %
Eosinophils Absolute: 0 10*3/uL (ref 0.0–0.5)
Eosinophils Relative: 0 %
HCT: 25.4 % — ABNORMAL LOW (ref 36.0–46.0)
Hemoglobin: 8.2 g/dL — ABNORMAL LOW (ref 12.0–15.0)
Immature Granulocytes: 1 %
Lymphocytes Relative: 4 %
Lymphs Abs: 0.9 10*3/uL (ref 0.7–4.0)
MCH: 28.2 pg (ref 26.0–34.0)
MCHC: 32.3 g/dL (ref 30.0–36.0)
MCV: 87.3 fL (ref 80.0–100.0)
Monocytes Absolute: 0.9 10*3/uL (ref 0.1–1.0)
Monocytes Relative: 4 %
Neutro Abs: 22.3 10*3/uL — ABNORMAL HIGH (ref 1.7–7.7)
Neutrophils Relative %: 91 %
Platelets: 302 10*3/uL (ref 150–400)
RBC: 2.91 MIL/uL — ABNORMAL LOW (ref 3.87–5.11)
RDW: 14 % (ref 11.5–15.5)
WBC: 24.5 10*3/uL — ABNORMAL HIGH (ref 4.0–10.5)
nRBC: 0.1 % (ref 0.0–0.2)

## 2021-07-13 LAB — FERRITIN: Ferritin: 419 ng/mL — ABNORMAL HIGH (ref 11–307)

## 2021-07-13 LAB — FOLATE RBC
Folate, Hemolysate: 341 ng/mL
Folate, RBC: 1083 ng/mL (ref >498)
Hematocrit: 31.5 % — ABNORMAL LOW (ref 34.0–46.6)

## 2021-07-13 LAB — GLUCOSE, CAPILLARY
Glucose-Capillary: 212 mg/dL — ABNORMAL HIGH (ref 70–99)
Glucose-Capillary: 146 mg/dL — ABNORMAL HIGH (ref 70–99)
Glucose-Capillary: 127 mg/dL — ABNORMAL HIGH (ref 70–99)
Glucose-Capillary: 139 mg/dL — ABNORMAL HIGH (ref 70–99)
Glucose-Capillary: 240 mg/dL — ABNORMAL HIGH (ref 70–99)

## 2021-07-13 LAB — VITAMIN D 25 HYDROXY (VIT D DEFICIENCY, FRACTURES): Vit D, 25-Hydroxy: 11.59 ng/mL — ABNORMAL LOW (ref 30–100)

## 2021-07-13 LAB — HEPARIN LEVEL (UNFRACTIONATED)
Heparin Unfractionated: 0.91 IU/mL — ABNORMAL HIGH (ref 0.30–0.70)
Heparin Unfractionated: 0.56 IU/mL (ref 0.30–0.70)
Heparin Unfractionated: 0.29 IU/mL — ABNORMAL LOW (ref 0.30–0.70)

## 2021-07-13 LAB — APTT: aPTT: 95 seconds — ABNORMAL HIGH (ref 24–36)

## 2021-07-13 LAB — PHOSPHORUS: Phosphorus: 2.3 mg/dL — ABNORMAL LOW (ref 2.5–4.6)

## 2021-07-13 LAB — C-REACTIVE PROTEIN: CRP: 7.8 mg/dL — ABNORMAL HIGH (ref <1.0)

## 2021-07-13 LAB — BRAIN NATRIURETIC PEPTIDE: B Natriuretic Peptide: 169 pg/mL — ABNORMAL HIGH (ref 0.0–100.0)

## 2021-07-13 LAB — MAGNESIUM: Magnesium: 1.7 mg/dL (ref 1.7–2.4)

## 2021-07-13 LAB — LACTATE DEHYDROGENASE: LDH: 177 U/L (ref 98–192)

## 2021-07-13 LAB — D-DIMER, QUANTITATIVE: D-Dimer, Quant: 3.33 ug/mL-FEU — ABNORMAL HIGH (ref 0.00–0.50)

## 2021-07-13 MED ORDER — POTASSIUM CHLORIDE 10 MEQ/100ML IV SOLN
10.0000 meq | INTRAVENOUS | Status: AC
  Administered 2021-07-13 (×2): 10 meq via INTRAVENOUS
  Filled 2021-07-13 (×2): qty 100

## 2021-07-13 MED ORDER — CALCIUM GLUCONATE-NACL 1-0.675 GM/50ML-% IV SOLN
1.0000 g | INTRAVENOUS | Status: AC
  Administered 2021-07-13 (×2): 1000 mg via INTRAVENOUS
  Filled 2021-07-13 (×2): qty 50

## 2021-07-13 MED ORDER — ALBUTEROL SULFATE HFA 108 (90 BASE) MCG/ACT IN AERS: 2.0000 | INHALATION_SPRAY | RESPIRATORY_TRACT | Status: DC | PRN

## 2021-07-13 MED ORDER — POTASSIUM CHLORIDE IN NACL 20-0.9 MEQ/L-% IV SOLN: INTRAVENOUS | Status: DC

## 2021-07-13 MED ORDER — CALCIUM GLUCONATE-NACL 2-0.675 GM/100ML-% IV SOLN: 2.0000 g | Freq: Once | INTRAVENOUS | Status: DC

## 2021-07-13 MED ORDER — VANCOMYCIN HCL 500 MG/100ML IV SOLN
500.0000 mg | INTRAVENOUS | Status: AC
  Administered 2021-07-13: 500 mg via INTRAVENOUS
  Filled 2021-07-13: qty 100

## 2021-07-13 MED ORDER — ALBUMIN HUMAN 25 % IV SOLN
25.0000 g | Freq: Four times a day (QID) | INTRAVENOUS | Status: AC
  Administered 2021-07-13 (×3): 25 g via INTRAVENOUS
  Filled 2021-07-13 (×3): qty 100

## 2021-07-13 MED ORDER — MAGNESIUM SULFATE 2 GM/50ML IV SOLN
2.0000 g | Freq: Once | INTRAVENOUS | Status: AC
  Administered 2021-07-13: 2 g via INTRAVENOUS
  Filled 2021-07-13: qty 50

## 2021-07-13 MED ORDER — POTASSIUM PHOSPHATES 15 MMOLE/5ML IV SOLN
15.0000 mmol | Freq: Once | INTRAVENOUS | Status: AC
  Administered 2021-07-13: 15 mmol via INTRAVENOUS
  Filled 2021-07-13: qty 5

## 2021-07-13 MED ORDER — OXYCODONE HCL 5 MG PO TABS
5.0000 mg | ORAL_TABLET | ORAL | Status: DC | PRN
Start: 2021-07-13 — End: 2021-07-20
  Administered 2021-07-13 – 2021-07-17 (×2): 5 mg via ORAL
  Filled 2021-07-13 (×2): qty 1

## 2021-07-13 MED ORDER — POTASSIUM CHLORIDE CRYS ER 20 MEQ PO TBCR
40.0000 meq | EXTENDED_RELEASE_TABLET | Freq: Once | ORAL | Status: AC
  Administered 2021-07-13: 40 meq via ORAL
  Filled 2021-07-13: qty 2

## 2021-07-13 MED ORDER — SODIUM CHLORIDE 0.9 % IV SOLN: INTRAVENOUS | Status: DC

## 2021-07-13 MED ORDER — POTASSIUM CHLORIDE 10 MEQ/100ML IV SOLN
10.0000 meq | INTRAVENOUS | Status: DC
Start: 2021-07-13 — End: 2021-07-13
  Filled 2021-07-13 (×6): qty 100

## 2021-07-13 NOTE — Progress Notes (Signed)
Taylor Andrews for IV Heparin Indication: pulmonary embolus  Allergies  Allergen Reactions   Milk-Related Compounds Other (See Comments)    Lactose Intolerant    Iohexol Other (See Comments)     Desc: PT STATES THAT THE CONTRAST THAT SHE HAD IN 2004 MADE HER SICK, PT COULD NOT SPECIFY REACTION TYPES     Patient Measurements: Height: '5\' 1"'$  (154.9 cm) Weight: 45.6 kg (100 lb 8.5 oz) IBW/kg (Calculated) : 47.8 HEPARIN DW (KG): 42.6   Vital Signs: Temp: 97.6 F (36.4 C) (09/08 1600) Temp Source: Axillary (09/08 1600) BP: 124/25 (09/08 0600) Pulse Rate: 51 (09/08 0600)  Labs: Recent Labs    07/11/21 0442 07/11/21 2029 07/12/21 0500 07/12/21 0619 07/12/21 0906 07/12/21 1715 07/13/21 0300 07/13/21 0919 07/13/21 1612  HGB 10.0*  --  8.7*  --   --   --  8.2*  --   --   HCT 32.3*  --  26.6*  --  31.5*  --  25.4*  --   --   PLT 242  --  299  --   --   --  302  --   --   APTT  --   --   --   --   --   --   --  95*  --   HEPARINUNFRC  --    < >  --    < >  --  0.18* 0.91* 0.56 0.29*  CREATININE 0.61  --  0.55  --   --  0.58 0.48 0.57  --    < > = values in this interval not displayed.    Estimated Creatinine Clearance: 37.7 mL/min (by C-G formula based on SCr of 0.57 mg/dL).   Medical History: Past Medical History:  Diagnosis Date   Adenomatous colon polyp    carcinoma in situ   AMS (altered mental status) 07/09/2021   Chronic abdominal pain    Dementia (HCC)    Diabetes mellitus    Fatty liver    on [place] normal LFT'S   GERD (gastroesophageal reflux disease)    Hyperlipidemia    Hypertension    Iron deficiency    Chronic current parameter normal; 4/09 iron was 12 sat 5%   SDH (subdural hematoma) ([place]) 01/2014   chronic    Assessment: 87 yr old woman was admitted with acute encephalopathy, sepsis, COVID-19 infection, sacral decubitus ulcer/cellulitis. MD concerned for PE. CT angiogram today showed large R-rided PE with evidence of R  heart strain, consistent with at least submassive PE. CT angiogram. Dopplers showed age-indeterrminate thrombus in LLE (suggestive for chronic DVT, rather than acute DVT). D-dimer >20.0.   Heparin level 0.56  is therapeutic  at 900units/hr. No bleeding issues noted and discussed with RN (hemoglobin continues to trend down to 8.2) Platelet count stable.  PM update: HL at 7 hours is 0.29   Goal of Therapy:  Heparin level 0.3-0.7 units/ml Monitor platelets by anticoagulation protocol: Yes   Plan:  Increase heparin infusion to 950 units/hr Check heparin level in 8 hrs Monitor daily heparin level and CBC Monitor for bleeding F/U transition to oral anticoagulant when able  Jensen Kilburg A. Levada Dy, PharmD, BCPS, FNKF Clinical Pharmacist [place] Please utilize Amion for appropriate phone number to reach the unit pharmacist ([place])  07/13/2021 5:14 PM

## 2021-07-13 NOTE — Progress Notes (Signed)
[place] for IV Heparin Indication: pulmonary embolus  Allergies  Allergen Reactions   Milk-Related Compounds Other (See Comments)    Lactose Intolerant    Iohexol Other (See Comments)     Desc: PT STATES THAT THE CONTRAST THAT SHE HAD IN 2004 MADE HER SICK, PT COULD NOT SPECIFY REACTION TYPES     Patient Measurements: Height: '5\' 1"'$  (154.9 cm) Weight: 45.6 kg (100 lb 8.5 oz) IBW/kg (Calculated) : 47.8 HEPARIN DW (KG): 42.6   Vital Signs: Temp: 97.6 F (36.4 C) (09/08 0700) Temp Source: Axillary (09/08 0700) BP: 124/25 (09/08 0600) Pulse Rate: 51 (09/08 0600)  Labs: Recent Labs    07/11/21 0442 07/11/21 2029 07/12/21 0500 07/12/21 0619 07/12/21 1715 07/13/21 0300 07/13/21 0919  HGB 10.0*  --  8.7*  --   --  8.2*  --   HCT 32.3*  --  26.6*  --   --  25.4*  --   PLT 242  --  299  --   --  302  --   APTT  --   --   --   --   --   --  95*  HEPARINUNFRC  --    < >  --    < > 0.18* 0.91* 0.56  CREATININE 0.61  --  0.55  --  0.58 0.48 0.57   < > = values in this interval not displayed.    Estimated Creatinine Clearance: 37.7 mL/min (by C-G formula based on SCr of 0.57 mg/dL).   Medical History: Past Medical History:  Diagnosis Date   Adenomatous colon polyp    carcinoma in situ   AMS (altered mental status) 07/09/2021   Chronic abdominal pain    Dementia (HCC)    Diabetes mellitus    Fatty liver    on [place] normal LFT'S   GERD (gastroesophageal reflux disease)    Hyperlipidemia    Hypertension    Iron deficiency    Chronic current parameter normal; 4/09 iron was 12 sat 5%   SDH (subdural hematoma) ([place]) 01/2014   chronic    Assessment: 87 yr old woman was admitted with acute encephalopathy, sepsis, COVID-19 infection, sacral decubitus ulcer/cellulitis. MD concerned for PE. CT angiogram today showed large R-rided PE with evidence of R heart strain, consistent with at least submassive PE. CT angiogram. Dopplers showed  age-indeterrminate thrombus in LLE (suggestive for chronic DVT, rather than acute DVT). D-dimer >20.0.   Heparin level 0.56  is therapeutic  at 900units/hr. No bleeding issues noted and discussed with RN (hemoglobin continues to trend down to 8.2) Platelet count stable.   Goal of Therapy:  Heparin level 0.3-0.7 units/ml Monitor platelets by anticoagulation protocol: Yes   Plan:  Continue heparin infusion at 900 units/hr Check heparin level in 8 hrs Monitor daily heparin level and CBC Monitor for bleeding F/U transition to oral anticoagulant when able  Isac Sarna, BS Vena [place], BCPS Clinical Pharmacist Pager [phone] 07/13/2021 11:55 AM

## 2021-07-13 NOTE — Consult Note (Signed)
Consultation Note Date: 07/13/2021   Patient Name: Taylor Andrews  DOB: Mar 29, 1934  MRN: [id number]  Age / Sex: 87 y.o., female  PCP: Taylor School, MD Referring Physician: Damita Lack, MD  Reason for Consultation: Establishing goals of Andrews  HPI/Patient Profile: 87 y.o. female  with past medical history of dementia, HTN/HLD, GERD, DM, fatty liver, subdural hematoma  admitted on 07/09/2021 with severe sepsis secondary to COVID-19 infection, large right pulmonary embolism with cor pulmonale, submassive PE, bilateral lower extremity DVT.   Clinical Assessment and Goals of Andrews: I have reviewed medical records including EPIC notes, labs and imaging, received report from RN, assessed the patient.  Taylor Andrews is lying quietly in bed.  She appears acutly/chronically ill and frail.  She has known dementia and therefore I do not ask orientation questions.  I am not sure that she can make her basic needs known.  There is no family at bedside at this time.  Call to niece Taylor Andrews to discuss diagnosis prognosis, Dante, EOL wishes, disposition and options. I introduced Palliative Medicine as specialized medical Andrews for people living with serious illness. It focuses on providing relief from the symptoms and stress of a serious illness. The goal is to improve quality of life for both the patient and the family.  We discussed a brief life review of the patient.  Taylor Andrews has 2 sons, they both live in [place].  Her nieces, Taylor Andrews and Taylor Andrews are her legal guardians, they have paperwork filed at the court house.  Taylor Andrews has been an ALF with "[facility] Andrews" for the past 5 years in December.  They share that she was forgetting things, leaving the stove on and therefore was put in assisted living.  Taylor Andrews states that ultimately, she would prefer for Taylor Andrews to return to [facility] if she is able.  We talk in  detail about physical therapy evaluation when the time is right, short-term rehab if qualified, possible need for long-term Andrews.  Taylor Andrews states that she and her sister discussed this, realizing that Taylor Andrews may not recover well enough to return to ALF.  We then focused on their current illness.  Taylor Andrews is able to accurately name Taylor Andrews's acute health problems.  We talked in detail about antibiotics, COVID treatment, blood clots, poor by mouth intake.  Taylor Andrews shares that she had noticed Taylor Andrews had been pocketing food while at the ALF.  Share that "we must eat to live".  We talked about PEG tube, that this is not recommended, and does not change Taylor Andrews other health concerns.  The natural disease trajectory and expectations at EOL were discussed.   Discussed the importance of continued conversation with family and the medical providers regarding overall plan of Andrews and treatment options, ensuring decisions are within the context of the patient's values and GOCs.  Questions and concerns were addressed.  The family was encouraged to call with questions or concerns.  PMT will continue to support holistically.  Conference  with attending, bedside nursing staff, transition of Andrews team related to patient condition, needs, goals of Andrews, disposition.   HCPOA   HCPOA -nieces, Taylor Andrews and Taylor Andrews are sisters.  Taylor Andrews has 2 sons, living in [place].    SUMMARY OF RECOMMENDATIONS   At this point continue to treat the treatable Time for outcomes Goal is to return to ALF, [facility], if possible Short-term rehab versus long-term Andrews   Code Status/Advance Andrews Planning: DNR  Symptom Management:  Per hospitalist, no additional needs at this time.   Palliative Prophylaxis:  Oral Andrews and Turn Reposition  Additional Recommendations (Limitations, Scope, Preferences): Treat the treatable, but no CPR or intubation   Psycho-social/Spiritual:  Desire for further  Chaplaincy support:no Additional Recommendations: Caregiving  Support/Resources and Education on Hospice  Prognosis:  Unable to determine, based on outcomes.  3 to 6 months or less would not be surprising based on acuity of current illness, pocketing of food, functional decline.  Discharge Planning:  To be determined, based on outcomes.       Primary Diagnoses: Present on Admission:  Acute encephalopathy  Dementia ([place])  HTN (hypertension)  COVID-19 virus infection  Severe sepsis ([place])  AMS (altered mental status)  Decubitus ulcer of sacral region, unstageable ([place])  Sacral osteomyelitis ([place])  Cellulitis of sacral region  Left leg DVT ([place])  Pulmonary embolus ([place])   I have reviewed the medical record, interviewed the patient and family, and examined the patient. The following aspects are pertinent.  Past Medical History:  Diagnosis Date   Adenomatous colon polyp    carcinoma in situ   AMS (altered mental status) 07/09/2021   Chronic abdominal pain    Dementia (HCC)    Diabetes mellitus    Fatty liver    on [place] normal LFT'S   GERD (gastroesophageal reflux disease)    Hyperlipidemia    Hypertension    Iron deficiency    Chronic current parameter normal; 4/09 iron was 12 sat 5%   SDH (subdural hematoma) ([place]) 01/2014   chronic   Social History   Socioeconomic History   Marital status: Divorced    Spouse name: Not on file   Number of children: Not on file   Years of education: Not on file   Highest education level: Not on file  Occupational History   Not on file  Tobacco Use   Smoking status: Never   Smokeless tobacco: Never  Substance and Sexual Activity   Alcohol use: No   Drug use: No   Sexual activity: Not Currently  Other Topics Concern   Not on file  Social History Narrative   Not on file   Social Determinants of Health   Financial Resource Strain: Not on file  Food Insecurity: Not on file  Transportation Needs: Not on file  Physical Activity:  Not on file  Stress: Not on file  Social Connections: Not on file   Family History  Problem Relation Age of Onset   Lupus Sister    Other Sister        ? Liver cancer   Scheduled Meds:  vitamin C  500 mg Oral BID   Chlorhexidine Gluconate Cloth  6 each Topical Daily   collagenase   Topical Daily   feeding supplement  1 Container Oral TID BM   insulin aspart  0-15 Units Subcutaneous Q4H   Ipratropium-Albuterol  1 puff Inhalation BID   methylPREDNISolone (SOLU-MEDROL) injection  60 mg Intravenous Q12H  multivitamin-lutein  1 capsule Oral Daily   sodium chloride flush  10-40 mL Intracatheter Q12H   zinc sulfate  220 mg Oral Daily   Continuous Infusions:  sodium chloride 75 mL/hr at 07/13/21 1000   albumin human 25 g (07/13/21 1100)   ciprofloxacin 200 mL/hr at 07/13/21 0607   heparin 900 Units/hr (07/13/21 0607)   lactated ringers     magnesium sulfate bolus IVPB 2 g (07/13/21 1100)   potassium chloride     potassium PHOSPHATE IVPB (in mmol)     remdesivir 100 mg in NS 100 mL 100 mg (07/13/21 1115)   vancomycin Stopped (07/12/21 1828)   PRN Meds:.acetaminophen, dextromethorphan-guaiFENesin, ondansetron **OR** ondansetron (ZOFRAN) IV, senna-docusate, sodium chloride flush, traZODone Medications Prior to Admission:  Prior to Admission medications   Medication Sig Start Date End Date Taking? Authorizing Provider  amLODipine (NORVASC) 10 MG tablet Take 10 mg by mouth daily.    Yes [provider]  donepezil (ARICEPT) 10 MG tablet Take 10 mg by mouth daily.    Yes [provider]  hydrochlorothiazide 25 MG tablet Take 25 mg by mouth daily.    Yes [provider]  memantine (NAMENDA) 10 MG tablet Take 10 mg by mouth 2 (two) times daily. 06/13/21  Yes [provider]  niacin (NIASPAN) 500 MG CR tablet Take 500 mg by mouth daily.  12/23/13  Yes [provider]  Omega-3 Fatty Acids (FISH OIL) 1000 MG CAPS Take 1 capsule by mouth 2 (two) times  daily with a meal.    Yes [provider]  oxybutynin (DITROPAN) 5 MG tablet Take 5 mg by mouth 2 (two) times daily. 06/13/21  Yes [provider]  potassium chloride SA (K-DUR,KLOR-CON) 20 MEQ tablet Take 20 mEq by mouth daily. 06/05/17  Yes [provider]  Probiotic Product (PROBIOTIC BLEND) CAPS Take 1 capsule by mouth daily. 07/08/21  Yes [provider]  docusate sodium (COLACE) 100 MG capsule Take 100 mg by mouth 3 (three) times daily. Patient not taking: Reported on 07/10/2021    [provider]  LORazepam (ATIVAN) 0.5 MG tablet Take 0.5 mg by mouth at bedtime. Patient not taking: Reported on 07/10/2021 06/13/21   [provider]   Allergies  Allergen Reactions   Milk-Related Compounds Other (See Comments)    Lactose Intolerant    Iohexol Other (See Comments)     Desc: PT [place] THAT SHE HAD IN 2004 MADE HER SICK, PT COULD NOT SPECIFY REACTION TYPES    Review of Systems  Unable to perform ROS: Dementia   Physical Exam Vitals and nursing note reviewed.  Constitutional:      General: She is not in acute distress.    Appearance: She is ill-appearing.  Cardiovascular:     Rate and Rhythm: Normal rate.  Pulmonary:     Effort: Pulmonary effort is normal. No respiratory distress.  Skin:    General: Skin is warm and dry.  Neurological:     Comments: Known dementia     Vital Signs: BP (!) 124/25   Pulse (!) 51   Temp 97.6 F (36.4 C) (Axillary)   Resp 15   Ht '5\' 1"'$  (1.549 m)   Wt 45.6 kg   SpO2 96%   BMI 18.99 kg/m  Pain Scale: CPOT POSS *See Group Information*: 1-Acceptable,Awake and alert Pain Score: 3    SpO2: SpO2: 96 % O2 Device:SpO2: 96 % O2 Flow Rate: .   IO: Intake/output summary:  Intake/Output Summary (Last 24 hours) at 07/13/2021 1150 Last data filed at 07/13/2021 0800 Gross per 24 hour  Intake 3061.24 ml  Output 1250 ml  Andrews 1811.24 ml    LBM: Last BM Date:  (UTA) Baseline Weight:  Weight: 54.4 kg Most recent weight: Weight: 45.6 kg     Palliative Assessment/Data:   Flowsheet Rows    Flowsheet Row Most Recent Value  Intake Tab   Referral Department Hospitalist  Unit at Time of Referral Intermediate Andrews Unit  Palliative Andrews Primary Diagnosis Pulmonary  Date Notified 07/12/21  Palliative Andrews Type New Palliative Andrews  Reason for referral Clarify Goals of Andrews  Date of Admission 07/09/21  Date first seen by Palliative Andrews 07/13/21  # of days Palliative referral response time 1 Day(s)  # of days IP prior to Palliative referral 3  Clinical Assessment   Palliative Performance Scale Score 30%  Pain Max last 24 hours Not able to report  Pain Min Last 24 hours Not able to report  Dyspnea Max Last 24 Hours Not able to report  Dyspnea Min Last 24 hours Not able to report  Psychosocial & Spiritual Assessment   Palliative Andrews Outcomes        Time In: 0940 Time Out: 1050 Time Total: 70 minutes  Greater than 50%  of this time was spent counseling and coordinating Andrews related to the above assessment and plan.  Signed by: Drue Novel, NP   Please contact Palliative Medicine Team phone at [phone] for questions and concerns.  For individual provider: See Shea Evans

## 2021-07-13 NOTE — Progress Notes (Signed)
PROGRESS NOTE    Taylor Andrews  [id number] DOB: 11/09/1933 DOA: 07/09/2021 PCP: Redmond School, MD   Brief Narrative:  87 y.o. BF PMHx dementia,, hypertension, DM type II controlled, fatty liver disease,  subdural hematoma 2015 came from nursing home for altered mental status and cough.  Admitted 3 weeks ago for dehydration with possible sepsis initially on vancomycin and cefepime, MRI brain was unremarkable, cultures were negative and was discharged on antibiotics.  Admitted to the hospital for sepsis secondary to [id number] infection complicated by pulmonary embolism. She was started on Heparin drip.      Assessment & Plan:   Principal Problem:   Acute encephalopathy Active Problems:   Dementia (HCC)   HTN (hypertension)   DM (diabetes mellitus), type 2 (HCC)   COVID-19 virus infection   Severe sepsis (HCC)   AMS (altered mental status)   Decubitus ulcer of sacral region, unstageable ([place])   Sacral osteomyelitis ([place])   Cellulitis of sacral region   Left leg DVT ([place])   Pulmonary embolus (HCC)  Severe sepsis secondary to COVID-19 infection - Sepsis physiology improving.  Underlying COVID-19 infection and cellulitis/infected sacral decubitus ulcer. - PICC line- 9/7 -Remdesivir-day 4/5 - Solu-Medrol 60 mg twice daily-D3 - I-S/flutter valve, bronchodilators, vitamins -Procalcitonin 0.56  Right large pulmonary embolism with cor pulmonale, submassive PE Bilateral lower extremity DVT -Suspect from COVID-19 - On heparin drip; likely should continue for another 48 hrs or so to give enough time to reduce the clot burden. Therefore will be on long term PO AC as long as she doesn't develop any side effects.  - Lower extremity Dopplers-positive for bilateral DVT - Echocardiogram - ef 65%   Hypokalemia - Repletion  Unstageable sacral decubitus ulcer Pressure Injury 07/10/21 Coccyx Medial Unstageable - Full thickness tissue loss in which the base of the injury is covered by  slough (yellow, tan, gray, green or brown) and/or eschar (tan, brown or black) in the wound bed. (Active)  07/10/21 0000  Location: Coccyx  Location Orientation: Medial  Staging: Unstageable - Full thickness tissue loss in which the base of the injury is covered by slough (yellow, tan, gray, green or brown) and/or eschar (tan, brown or black) in the wound bed.  Wound Description (Comments):   Present on Admission: Yes  -Currently on vancomycin and ciprofloxacin; Likely last day of vanc today.   Acute metabolic encephalopathy; waxing and waning  History of dementia - At baseline can feed herself and occasionally recognize family members but unable to answer all questions.  Currently unable to do these things as well - History improved with underlying infection - CT head negative  Essential hypertension - Home meds on hold  Diabetes mellitus type 2 - Sliding scale and Accu-Chek.  A1c 5.2     DVT prophylaxis: Heparin drip Code Status: DNR Family Communication:  Spoke with her niece Andree Elk  Status is: Inpatient  Remains inpatient appropriate because:Inpatient level of care appropriate due to severity of illness  Dispo: The patient is from: Home              Anticipated d/c is to: Home              Patient currently is not medically stable to d/c.  Continue heparin drip due to significantly large clot burden.   Difficult to place patient No   Nutritional status  Nutrition Problem: Increased nutrient needs Etiology: wound healing  Signs/Symptoms: estimated needs  Interventions: Ensure Enlive (each supplement provides 350kcal and 20  grams of protein), MVI, Magic cup  Body mass index is 18.99 kg/m.      Subjective: Overall weak, has some pleuritic chest pain.   Review of Systems Otherwise negative except as per HPI, including: General = no fevers, chills, dizziness,  fatigue HEENT/EYES = negative for loss of vision, double vision, blurred vision,  sore  throa Cardiovascular= negative for palpitation Respiratory/lungs= negative for shortness of breath, cough, wheezing; hemoptysis,  Gastrointestinal= negative for nausea, vomiting, abdominal pain Genitourinary= negative for Dysuria MSK = Negative for arthralgia, myalgias Neurology= Negative for headache, numbness, tingling  Psychiatry= Negative for suicidal and homocidal ideation Skin= Negative for Rash    Examination: Constitutional: Not in acute distress; elderly frail.  Respiratory: Clear to auscultation bilaterally Cardiovascular: Normal sinus rhythm, no rubs Abdomen: Nontender nondistended good bowel sounds Musculoskeletal: No edema noted Skin: No rashes seen Neurologic: CN 2-12 grossly intact.  And nonfocal Psychiatric: Alert to name only. Poor judgement and insight.  Objective: Vitals:   07/13/21 0506 07/13/21 0600 07/13/21 0700 07/13/21 0831  BP: (!) 144/35 (!) 124/25    Pulse: 98 (!) 51    Resp: 17 15    Temp:   97.6 F (36.4 C)   TempSrc:   Axillary   SpO2: 100% 98%  96%  Weight:      Height:        Intake/Output Summary (Last 24 hours) at 07/13/2021 1349 Last data filed at 07/13/2021 0800 Gross per 24 hour  Intake 2741.24 ml  Output 1250 ml  Net 1491.24 ml   Filed Weights   07/09/21 2249 07/11/21 0500 07/13/21 0248  Weight: 42.6 kg 43.4 kg 45.6 kg     Data Reviewed:   CBC: Recent Labs  Lab 07/09/21 1628 07/10/21 0401 07/11/21 0442 07/12/21 0500 07/13/21 0300  WBC 20.8* 16.6* 19.5* 24.2* 24.5*  NEUTROABS 18.7* 13.6* 17.5* 21.9* 22.3*  HGB 12.0 9.9* 10.0* 8.7* 8.2*  HCT 38.6 32.0* 32.3* 26.6* 25.4*  MCV 91.9 91.2 91.0 86.9 87.3  PLT 365 276 242 299 [id number]   Basic Metabolic Panel: Recent Labs  Lab 07/10/21 0401 07/11/21 0442 07/12/21 0500 07/12/21 1715 07/13/21 0300 07/13/21 0919  NA 143 142 139 136 139 139  K 3.5 3.3* 2.7* 3.5 2.8* 3.8  CL 107 107 107 108 115* 109  CO2 '30 25 26 25 '$ 21* 23  GLUCOSE 141* 83 137* 185* 197* 135*  BUN '20 17 15  17 14 15  '$ CREATININE 0.73 0.61 0.55 0.58 0.48 0.57  CALCIUM 8.6* 8.4* 7.7* 7.3* 6.4* 7.8*  MG 2.3 2.3 2.1  --  1.7  --   PHOS 2.3* 2.6 2.3*  --  2.3*  --    GFR: Estimated Creatinine Clearance: 37.7 mL/min (by C-G formula based on SCr of 0.57 mg/dL). Liver Function Tests: Recent Labs  Lab 07/10/21 0401 07/11/21 0442 07/12/21 0500 07/13/21 0300 07/13/21 0919  AST '24 22 15 19 22  '$ ALT '17 16 15 16 19  '$ ALKPHOS 56 70 56 54 64  BILITOT 0.5 0.2* 0.4 0.2* 0.3  PROT 6.0* 6.1* 5.2* 4.7* 5.5*  ALBUMIN 2.2* 2.1* 1.9* 1.7* 2.0*   No results for input(s): LIPASE, AMYLASE in the last 168 hours. No results for input(s): AMMONIA in the last 168 hours. Coagulation Profile: No results for input(s): INR, PROTIME in the last 168 hours. Cardiac Enzymes: No results for input(s): CKTOTAL, CKMB, CKMBINDEX, TROPONINI in the last 168 hours. BNP (last 3 results) No results for input(s): PROBNP in the last 8760 hours.  HbA1C: No results for input(s): HGBA1C in the last 72 hours. CBG: Recent Labs  Lab 07/12/21 2012 07/12/21 2331 07/13/21 0254 07/13/21 0820 07/13/21 1141  GLUCAP 178* 139* 212* 146* 127*   Lipid Profile: No results for input(s): CHOL, HDL, LDLCALC, TRIG, CHOLHDL, LDLDIRECT in the last 72 hours. Thyroid Function Tests: Recent Labs    07/12/21 0619  TSH 0.338*   Anemia Panel: Recent Labs    07/12/21 0619 07/13/21 0300  VITAMINB12 660  --   FERRITIN 606* 419*   Sepsis Labs: Recent Labs  Lab 07/09/21 1801 07/10/21 0401 07/10/21 0903 07/10/21 1503 07/11/21 0442 07/11/21 2029 07/11/21 2338 07/12/21 0239 07/12/21 0500  PROCALCITON 1.17 0.85  --   --  1.02  --   --   --  0.56  LATICACIDVEN  --   --    < > 3.0*  --  1.2 1.3 1.8  --    < > = values in this interval not displayed.    Recent Results (from the past 240 hour(s))  Resp Panel by RT-PCR (Flu A&B, Covid) Nasopharyngeal Swab     Status: Abnormal   Collection Time: 07/09/21  5:41 PM   Specimen:  Nasopharyngeal Swab; Nasopharyngeal(NP) swabs in vial transport medium  Result Value Ref Range Status   SARS Coronavirus 2 by RT PCR POSITIVE (A) NEGATIVE Final    Comment: CRITICAL RESULT CALLED TO, READ BACK BY AND VERIFIED WITH: [place] 07/09/2021 COLE[place],R (NOTE) SARS-CoV-2 target nucleic acids are DETECTED.  The SARS-CoV-2 RNA is generally detectable in upper respiratory specimens during the acute phase of infection. Positive results are indicative of the presence of the identified virus, but do not rule out bacterial infection or co-infection with other pathogens not detected by the test. Clinical correlation with patient history and other diagnostic information is necessary to determine patient infection status. The expected result is Negative.  Fact Sheet for Patients: EntrepreneurPulse.com.au  Fact Sheet for Healthcare Providers: IncredibleEmployment.be  This test is not yet approved or cleared by the [place] FDA and  has been authorized for detection and/or diagnosis of SARS-CoV-2 by FDA under an Emergency Use Authorization (EUA).  This EUA will remain in effect (meaning this t est can be used) for the duration of  the COVID-19 declaration under Section 564(b)(1) of the Act, 21 U.S.C. section 360bbb-3(b)(1), unless the authorization is terminated or revoked sooner.     Influenza A by PCR NEGATIVE NEGATIVE Final   Influenza B by PCR NEGATIVE NEGATIVE Final    Comment: (NOTE) The Xpert Xpress SARS-CoV-2/FLU/RSV plus assay is intended as an aid in the diagnosis of influenza from Nasopharyngeal swab specimens and should not be used as a sole basis for treatment. Nasal washings and aspirates are unacceptable for Xpert Xpress SARS-CoV-2/FLU/RSV testing.  Fact Sheet for Patients: EntrepreneurPulse.com.au  Fact Sheet for Healthcare Providers: IncredibleEmployment.be  This test is not yet  approved or cleared by the [place] FDA and has been authorized for detection and/or diagnosis of SARS-CoV-2 by FDA under an Emergency Use Authorization (EUA). This EUA will remain in effect (meaning this test can be used) for the duration of the COVID-19 declaration under Section 564(b)(1) of the Act, 21 U.S.C. section 360bbb-3(b)(1), unless the authorization is terminated or revoked.  Performed at [facility], [address], [place], [place] [address]   Culture, blood (routine x 2)     Status: None (Preliminary result)   Collection Time: 07/09/21  5:50 PM   Specimen: Right Antecubital; Blood  Result Value Ref Range Status   Specimen Description RIGHT ANTECUBITAL  Final   Special Requests   Final    BOTTLES DRAWN AEROBIC AND ANAEROBIC Blood Culture adequate volume   Culture   Final    NO GROWTH 4 DAYS Performed at [facility], [address], [place], [place] [address]    Report Status PENDING  Incomplete  Culture, blood (routine x 2)     Status: None (Preliminary result)   Collection Time: 07/09/21  6:14 PM   Specimen: BLOOD RIGHT ARM  Result Value Ref Range Status   Specimen Description BLOOD RIGHT ARM  Final   Special Requests   Final    BOTTLES DRAWN AEROBIC ONLY Blood Culture adequate volume   Culture   Final    NO GROWTH 4 DAYS Performed at [facility], [address], [place], [place] [address]    Report Status PENDING  Incomplete  MRSA Next Gen by PCR, Nasal     Status: None   Collection Time: 07/09/21 10:44 PM   Specimen: Nasal Mucosa; Nasal Swab  Result Value Ref Range Status   MRSA by PCR Next Gen NOT DETECTED NOT DETECTED Final    Comment: (NOTE) The GeneXpert MRSA Assay (FDA approved for NASAL specimens only), is one component of a comprehensive MRSA colonization surveillance program. It is not intended to diagnose MRSA infection nor to guide or monitor treatment for MRSA infections. Test performance is not FDA approved in patients less than 56  years old. Performed at [facility], [address] [place], [place] [address]          Radiology Studies: CT Angio Chest Pulmonary Embolism (PE) W or WO Contrast  Result Date: 07/11/2021 CLINICAL DATA:  Elevated D-dimer.  COVID-19. EXAM: CT ANGIOGRAPHY CHEST WITH CONTRAST TECHNIQUE: Multidetector CT imaging of the chest was performed using the standard protocol during bolus administration of intravenous contrast. Multiplanar CT image reconstructions and MIPs were obtained to evaluate the vascular anatomy. CONTRAST:  68m OMNIPAQUE IOHEXOL 350 MG/ML SOLN COMPARISON:  May 27, 2012. FINDINGS: Cardiovascular: Large linear filling defect is noted in distal right pulmonary artery which extends into the upper and lower lobe branches consistent with acute pulmonary embolus. RV/LV ratio of 1.4 is noted consistent with right heart strain. Pericardial effusion is noted. Atherosclerosis of thoracic aorta is noted without aneurysm or dissection. Mediastinum/Nodes: No enlarged mediastinal, hilar, or axillary lymph nodes. Thyroid gland, trachea, and esophagus demonstrate no significant findings. Lungs/Pleura: No pneumothorax or pleural effusion is noted. Minimal bilateral posterior basilar subsegmental atelectasis is noted. Multiple focal airspace opacities are noted in the right upper lobe which may represent multifocal pneumonia. Upper Abdomen: No acute abnormality. Musculoskeletal: No chest wall abnormality. No acute or significant osseous findings. Review of the MIP images confirms the above findings. IMPRESSION: Large right-sided pulmonary embolus is noted which extends into upper and lower lobe branches. Positive for acute PE with CT evidence of right heart strain (RV/LV Ratio = 1.4) consistent with at least submassive (intermediate risk) PE. The presence of right heart strain has been associated with an increased risk of morbidity and mortality. Please refer to the "PE Focused" order set in EPIC. Critical  Value/emergent results were called by telephone at the time of interpretation on 07/11/2021 at 2:35 pm to provider [facility] [place], who verbally acknowledged these results. Multiple small focal airspace opacities are noted in the right upper lobe consistent with multifocal pneumonia. Aortic Atherosclerosis (ICD10-I70.0). Electronically Signed   By: JMarijo Conception  M.D.   On: 07/11/2021 14:35   ECHOCARDIOGRAM COMPLETE  Result Date: 07/12/2021    ECHOCARDIOGRAM REPORT   Patient Name:   JAKARA GILKERSON Date of Exam: 07/12/2021 Medical Rec #:  [id number]    Height:       61.0 in Accession #:    [id number]   Weight:       95.7 lb Date of Birth:  11-May-1934   BSA:          1.381 m Patient Age:    87 years     BP:           153/38 mmHg Patient Gender: F            HR:           67 bpm. Exam Location:  [facility] Procedure: 2D Echo, Cardiac Doppler and Color Doppler Indications:    CHF-Acute Systolic [id number]  History:        Patient has no prior history of Echocardiogram examinations.                 Risk Factors:Hypertension and Dyslipidemia. COVID-19 virus                 infection. Dementia ([place]). Severe sepsis ([place]).  Sonographer:    Alvino Chapel RCS Referring Phys: [id number] [place]  Sonographer Comments: Image acquisition challenging due to patient behavioral factors. IMPRESSIONS  1. Left ventricular ejection fraction, by estimation, is 65 to 70%. The left ventricle has normal function. The left ventricle has no regional wall motion abnormalities. Left ventricular diastolic parameters are consistent with Grade I diastolic dysfunction (impaired relaxation).  2. Right ventricular systolic function is normal. The right ventricular size is normal. There is normal pulmonary artery systolic pressure.  3. The mitral valve is normal in structure. No evidence of mitral valve regurgitation. No evidence of mitral stenosis.  4. The aortic valve has an indeterminant number of cusps. There is moderate calcification of the aortic  valve. There is moderate thickening of the aortic valve. Aortic valve regurgitation is moderate. No aortic stenosis is present.  5. The inferior vena cava is normal in size with greater than 50% respiratory variability, suggesting right atrial pressure of 3 mmHg. FINDINGS  Left Ventricle: Left ventricular ejection fraction, by estimation, is 65 to 70%. The left ventricle has normal function. The left ventricle has no regional wall motion abnormalities. The left ventricular internal cavity size was normal in size. There is  no left ventricular hypertrophy. Left ventricular diastolic parameters are consistent with Grade I diastolic dysfunction (impaired relaxation). Normal left ventricular filling pressure. Right Ventricle: The right ventricular size is normal. No increase in right ventricular wall thickness. Right ventricular systolic function is normal. There is normal pulmonary artery systolic pressure. The tricuspid regurgitant velocity is 2.55 m/s, and  with an assumed right atrial pressure of 3 mmHg, the estimated right ventricular systolic pressure is 0000000 mmHg. Left Atrium: Left atrial size was normal in size. Right Atrium: Right atrial size was normal in size. Pericardium: There is no evidence of pericardial effusion. Mitral Valve: The mitral valve is normal in structure. No evidence of mitral valve regurgitation. No evidence of mitral valve stenosis. Tricuspid Valve: The tricuspid valve is normal in structure. Tricuspid valve regurgitation is mild . No evidence of tricuspid stenosis. Aortic Valve: The aortic valve has an indeterminant number of cusps. There is moderate calcification of the aortic valve. There is moderate thickening of the aortic valve. There is  moderate aortic valve annular calcification. Aortic valve regurgitation is moderate. Aortic regurgitation PHT measures 295 msec. No aortic stenosis is present. Aortic valve mean gradient measures 6.5 mmHg. Aortic valve peak gradient measures 15.6  mmHg. Aortic valve area, by VTI measures 1.53 cm. Pulmonic Valve: The pulmonic valve was not well visualized. Pulmonic valve regurgitation is not visualized. No evidence of pulmonic stenosis. Aorta: The aortic root is normal in size and structure. Venous: The inferior vena cava is normal in size with greater than 50% respiratory variability, suggesting right atrial pressure of 3 mmHg. IAS/Shunts: No atrial level shunt detected by color flow Doppler.  LEFT VENTRICLE PLAX 2D LVIDd:         3.60 cm  Diastology LVIDs:         1.80 cm  LV e' medial:    7.72 cm/s LV PW:         0.90 cm  LV E/e' medial:  12.3 LV IVS:        1.10 cm  LV e' lateral:   8.38 cm/s LVOT diam:     1.90 cm  LV E/e' lateral: 11.3 LV SV:         63 LV SV Index:   46 LVOT Area:     2.84 cm  RIGHT VENTRICLE RV S prime:     21.60 cm/s TAPSE (M-mode): 2.0 cm LEFT ATRIUM             Index       RIGHT ATRIUM          Index LA diam:        2.10 cm 1.52 cm/m  RA Area:     9.76 cm LA Vol (A2C):   17.5 ml 12.68 ml/m RA Volume:   21.70 ml 15.72 ml/m LA Vol (A4C):   36.5 ml 26.44 ml/m LA Biplane Vol: 26.6 ml 19.27 ml/m  AORTIC VALVE AV Area (Vmax):    1.39 cm AV Area (Vmean):   1.31 cm AV Area (VTI):     1.53 cm AV Vmax:           197.63 cm/s AV Vmean:          114.059 cm/s AV VTI:            0.414 m AV Peak Grad:      15.6 mmHg AV Mean Grad:      6.5 mmHg LVOT Vmax:         96.80 cm/s LVOT Vmean:        52.900 cm/s LVOT VTI:          0.223 m LVOT/AV VTI ratio: 0.54 AI PHT:            295 msec  AORTA Ao Root diam: 2.80 cm MITRAL VALVE                TRICUSPID VALVE MV Area (PHT): 2.47 cm     TR Peak grad:   26.0 mmHg MV Decel Time: 307 msec     TR Vmax:        255.00 cm/s MV E velocity: 95.10 cm/s MV A velocity: 113.00 cm/s  SHUNTS MV E/A ratio:  0.84         Systemic VTI:  0.22 m                             Systemic Diam: 1.90 cm Carlyle Dolly MD Electronically signed by Carlyle Dolly MD  Signature Date/Time: 07/12/2021/3:52:08 PM    Final    [place]  EKG SITE RITE  Result Date: 07/11/2021 If Site Rite image not attached, placement could not be confirmed due to current cardiac rhythm.       Scheduled Meds:  vitamin C  500 mg Oral BID   Chlorhexidine Gluconate Cloth  6 each Topical Daily   collagenase   Topical Daily   feeding supplement  1 Container Oral TID BM   insulin aspart  0-15 Units Subcutaneous Q4H   Ipratropium-Albuterol  1 puff Inhalation BID   methylPREDNISolone (SOLU-MEDROL) injection  60 mg Intravenous Q12H   multivitamin-lutein  1 capsule Oral Daily   sodium chloride flush  10-40 mL Intracatheter Q12H   zinc sulfate  220 mg Oral Daily   Continuous Infusions:  sodium chloride 75 mL/hr at 07/13/21 1000   albumin human 25 g (07/13/21 1100)   ciprofloxacin 200 mL/hr at 07/13/21 0607   heparin 900 Units/hr (07/13/21 0607)   lactated ringers     potassium PHOSPHATE IVPB (in mmol)     remdesivir 100 mg in NS 100 mL 100 mg (07/13/21 1115)   vancomycin       LOS: 3 days   Time spent= 35 mins    Fahima Cifelli Arsenio Loader, MD [facility]  If 7PM-7AM, please contact night-coverage  07/13/2021, 1:49 PM

## 2021-07-13 NOTE — Progress Notes (Addendum)
[place] for IV Heparin Indication: pulmonary embolus  Allergies  Allergen Reactions   Milk-Related Compounds Other (See Comments)    Lactose Intolerant    Iohexol Other (See Comments)     Desc: PT STATES THAT THE CONTRAST THAT SHE HAD IN 2004 MADE HER SICK, PT COULD NOT SPECIFY REACTION TYPES     Patient Measurements: Height: '5\' 1"'$  (154.9 cm) Weight: 45.6 kg (100 lb 8.5 oz) IBW/kg (Calculated) : 47.8 HEPARIN DW (KG): 42.6   Vital Signs: Temp: 97.8 F (36.6 C) (09/08 0248) Temp Source: Oral (09/08 0248) BP: 140/95 (09/08 0317) Pulse Rate: 111 (09/08 0317)  Labs: Recent Labs    07/11/21 0442 07/11/21 2029 07/12/21 0500 07/12/21 0619 07/12/21 1715 07/13/21 0300  HGB 10.0*  --  8.7*  --   --  8.2*  HCT 32.3*  --  26.6*  --   --  25.4*  PLT 242  --  299  --   --  302  HEPARINUNFRC  --    < >  --  0.19* 0.18* 0.91*  CREATININE 0.61  --  0.55  --  0.58  --    < > = values in this interval not displayed.    Estimated Creatinine Clearance: 37.7 mL/min (by C-G formula based on SCr of 0.58 mg/dL).   Medical History: Past Medical History:  Diagnosis Date   Adenomatous colon polyp    carcinoma in situ   AMS (altered mental status) 07/09/2021   Chronic abdominal pain    Dementia (HCC)    Diabetes mellitus    Fatty liver    on [place] normal LFT'S   GERD (gastroesophageal reflux disease)    Hyperlipidemia    Hypertension    Iron deficiency    Chronic current parameter normal; 4/09 iron was 12 sat 5%   SDH (subdural hematoma) ([place]) 01/2014   chronic    Assessment: 87 yr old woman was admitted with acute encephalopathy, sepsis, COVID-19 infection, sacral decubitus ulcer/cellulitis. MD concerned for PE. CT angiogram today showed large R-rided PE with evidence of R heart strain, consistent with at least submassive PE. CT angiogram. Dopplers showed age-indeterrminate thrombus in LLE (suggestive for chronic DVT, rather than acute DVT).  D-dimer >20.0.   Heparin level this morning is above goal at 0.91 on 1050 units/hr. No bleeding issues noted, however hemoglobin continues to trend down to 8.2. Platelet count stable.   Goal of Therapy:  Heparin level 0.3-0.7 units/ml Monitor platelets by anticoagulation protocol: Yes   Plan:  Decrease heparin infusion to 900 units/hr Check heparin level in 8 hrs Monitor daily heparin level and CBC Monitor for bleeding F/U transition to oral anticoagulant when able  Erin Hearing PharmD., BCPS Clinical Pharmacist 07/13/2021 3:56 AM

## 2021-07-14 LAB — CBC WITH DIFFERENTIAL/PLATELET
Abs Immature Granulocytes: 0.52 10*3/uL — ABNORMAL HIGH (ref 0.00–0.07)
Basophils Absolute: 0 10*3/uL (ref 0.0–0.1)
Basophils Relative: 0 %
Eosinophils Absolute: 0 10*3/uL (ref 0.0–0.5)
Eosinophils Relative: 0 %
HCT: 22.3 % — ABNORMAL LOW (ref 36.0–46.0)
Hemoglobin: 7 g/dL — ABNORMAL LOW (ref 12.0–15.0)
Immature Granulocytes: 2 %
Lymphocytes Relative: 3 %
Lymphs Abs: 0.6 10*3/uL — ABNORMAL LOW (ref 0.7–4.0)
MCH: 27.7 pg (ref 26.0–34.0)
MCHC: 31.4 g/dL (ref 30.0–36.0)
MCV: 88.1 fL (ref 80.0–100.0)
Monocytes Absolute: 0.9 10*3/uL (ref 0.1–1.0)
Monocytes Relative: 4 %
Neutro Abs: 20.9 10*3/uL — ABNORMAL HIGH (ref 1.7–7.7)
Neutrophils Relative %: 91 %
Platelets: 272 10*3/uL (ref 150–400)
RBC: 2.53 MIL/uL — ABNORMAL LOW (ref 3.87–5.11)
RDW: 14.1 % (ref 11.5–15.5)
WBC: 23 10*3/uL — ABNORMAL HIGH (ref 4.0–10.5)
nRBC: 0.2 % (ref 0.0–0.2)

## 2021-07-14 LAB — CBC
HCT: 23.7 % — ABNORMAL LOW (ref 36.0–46.0)
Hemoglobin: 7.6 g/dL — ABNORMAL LOW (ref 12.0–15.0)
MCH: 28.3 pg (ref 26.0–34.0)
MCHC: 32.1 g/dL (ref 30.0–36.0)
MCV: 88.1 fL (ref 80.0–100.0)
Platelets: 295 10*3/uL (ref 150–400)
RBC: 2.69 MIL/uL — ABNORMAL LOW (ref 3.87–5.11)
RDW: 14.2 % (ref 11.5–15.5)
WBC: 26.7 10*3/uL — ABNORMAL HIGH (ref 4.0–10.5)
nRBC: 0.4 % — ABNORMAL HIGH (ref 0.0–0.2)

## 2021-07-14 LAB — COMPREHENSIVE METABOLIC PANEL
ALT: 12 U/L (ref 0–44)
AST: 16 U/L (ref 15–41)
Albumin: 3.9 g/dL (ref 3.5–5.0)
Alkaline Phosphatase: 43 U/L (ref 38–126)
Anion gap: 5 (ref 5–15)
BUN: 11 mg/dL (ref 8–23)
CO2: 23 mmol/L (ref 22–32)
Calcium: 7 mg/dL — ABNORMAL LOW (ref 8.9–10.3)
Chloride: 107 mmol/L (ref 98–111)
Creatinine, Ser: 0.52 mg/dL (ref 0.44–1.00)
GFR, Estimated: >60 mL/min (ref >60)
Glucose, Bld: 283 mg/dL — ABNORMAL HIGH (ref 70–99)
Potassium: 4 mmol/L (ref 3.5–5.1)
Sodium: 135 mmol/L (ref 135–145)
Total Bilirubin: 0.2 mg/dL — ABNORMAL LOW (ref 0.3–1.2)
Total Protein: 6 g/dL — ABNORMAL LOW (ref 6.5–8.1)

## 2021-07-14 LAB — D-DIMER, QUANTITATIVE: D-Dimer, Quant: 2.23 ug/mL-FEU — ABNORMAL HIGH (ref 0.00–0.50)

## 2021-07-14 LAB — CULTURE, BLOOD (ROUTINE X 2)
Culture: NO GROWTH
Culture: NO GROWTH
Special Requests: ADEQUATE
Special Requests: ADEQUATE

## 2021-07-14 LAB — GLUCOSE, CAPILLARY
Glucose-Capillary: 152 mg/dL — ABNORMAL HIGH (ref 70–99)
Glucose-Capillary: 237 mg/dL — ABNORMAL HIGH (ref 70–99)
Glucose-Capillary: 251 mg/dL — ABNORMAL HIGH (ref 70–99)
Glucose-Capillary: 270 mg/dL — ABNORMAL HIGH (ref 70–99)
Glucose-Capillary: 71 mg/dL (ref 70–99)
Glucose-Capillary: 92 mg/dL (ref 70–99)

## 2021-07-14 LAB — PHOSPHORUS: Phosphorus: 4.4 mg/dL (ref 2.5–4.6)

## 2021-07-14 LAB — ABO/RH: ABO/RH(D): AB POS

## 2021-07-14 LAB — C-REACTIVE PROTEIN: CRP: 5.7 mg/dL — ABNORMAL HIGH (ref <1.0)

## 2021-07-14 LAB — MAGNESIUM: Magnesium: 2.1 mg/dL (ref 1.7–2.4)

## 2021-07-14 LAB — LACTATE DEHYDROGENASE: LDH: 140 U/L (ref 98–192)

## 2021-07-14 LAB — FERRITIN: Ferritin: 461 ng/mL — ABNORMAL HIGH (ref 11–307)

## 2021-07-14 LAB — PREPARE RBC (CROSSMATCH)

## 2021-07-14 LAB — HEPARIN LEVEL (UNFRACTIONATED)
Heparin Unfractionated: 0.38 IU/mL (ref 0.30–0.70)
Heparin Unfractionated: 0.58 IU/mL (ref 0.30–0.70)

## 2021-07-14 MED ORDER — METHYLPREDNISOLONE SODIUM SUCC 40 MG IJ SOLR
40.0000 mg | Freq: Two times a day (BID) | INTRAMUSCULAR | Status: AC
Start: 2021-07-14 — End: 2021-07-17
  Administered 2021-07-14 – 2021-07-17 (×7): 40 mg via INTRAVENOUS
  Filled 2021-07-14 (×7): qty 1

## 2021-07-14 MED ORDER — DONEPEZIL HCL 5 MG PO TABS
5.0000 mg | ORAL_TABLET | Freq: Every day | ORAL | Status: DC
  Administered 2021-07-14 – 2021-07-19 (×6): 5 mg via ORAL
  Filled 2021-07-14 (×6): qty 1

## 2021-07-14 MED ORDER — INSULIN ASPART 100 UNIT/ML IJ SOLN
0.0000 [IU] | Freq: Three times a day (TID) | INTRAMUSCULAR | Status: DC
  Administered 2021-07-14: 2 [IU] via SUBCUTANEOUS
  Administered 2021-07-15: 1 [IU] via SUBCUTANEOUS
  Administered 2021-07-15: 3 [IU] via SUBCUTANEOUS
  Administered 2021-07-15 – 2021-07-16 (×4): 1 [IU] via SUBCUTANEOUS
  Administered 2021-07-17: 2 [IU] via SUBCUTANEOUS
  Administered 2021-07-17 – 2021-07-18 (×2): 1 [IU] via SUBCUTANEOUS
  Administered 2021-07-18 – 2021-07-19 (×3): 2 [IU] via SUBCUTANEOUS
  Administered 2021-07-20: 1 [IU] via SUBCUTANEOUS

## 2021-07-14 MED ORDER — MEMANTINE HCL 10 MG PO TABS
5.0000 mg | ORAL_TABLET | Freq: Two times a day (BID) | ORAL | Status: DC
  Administered 2021-07-14 – 2021-07-20 (×13): 5 mg via ORAL
  Filled 2021-07-14 (×13): qty 1

## 2021-07-14 MED ORDER — SODIUM CHLORIDE 0.9% IV SOLUTION: Freq: Once | INTRAVENOUS | Status: AC

## 2021-07-14 MED ORDER — FUROSEMIDE 10 MG/ML IJ SOLN
20.0000 mg | Freq: Once | INTRAMUSCULAR | Status: AC
  Administered 2021-07-14: 20 mg via INTRAVENOUS
  Filled 2021-07-14: qty 2

## 2021-07-14 MED ORDER — INSULIN ASPART 100 UNIT/ML IJ SOLN
0.0000 [IU] | Freq: Every day | INTRAMUSCULAR | Status: DC
  Administered 2021-07-14 – 2021-07-16 (×2): 3 [IU] via SUBCUTANEOUS
  Administered 2021-07-18: 2 [IU] via SUBCUTANEOUS

## 2021-07-14 NOTE — Progress Notes (Signed)
Inpatient Diabetes Program Recommendations  AACE/ADA: New Consensus Statement on Inpatient Glycemic Control (2015)  Target Ranges:  Prepandial:   less than 140 mg/dL      Peak postprandial:   less than 180 mg/dL (1-2 hours)      Critically ill patients:  140 - 180 mg/dL   Lab Results  Component Value Date   GLUCAP 92 07/14/2021   HGBA1C 5.2 06/19/2021    Review of Glycemic Control Results for Taylor Andrews, Taylor Andrews (MRN [id number]) as of 07/14/2021 10:44  Ref. Range 07/13/2021 15:30 07/13/2021 19:59 07/14/2021 00:26 07/14/2021 05:57 07/14/2021 08:13  Glucose-Capillary Latest Ref Range: 70 - 99 mg/dL 139 (H) Novolog 2 units 240 (H) Novolog 5 units 270 (H) Novolog 8 units 71 92    Inpatient Diabetes Program Recommendations:   -Decrease Novolog correction to 0-9 units tid + hs 0-5 units Secure chat sent to Dr.[email or web address].  Thank you, Nani Gasser. Cherylene Ferrufino, RN, MSN, CDE  Diabetes Coordinator Inpatient Glycemic Control Team Team Pager [phone] (8am-5pm) 07/14/2021 10:46 AM

## 2021-07-14 NOTE — Progress Notes (Signed)
[place] for IV Heparin Indication: pulmonary embolus  Allergies  Allergen Reactions   Milk-Related Compounds Other (See Comments)    Lactose Intolerant    Iohexol Other (See Comments)     Desc: PT STATES THAT THE CONTRAST THAT SHE HAD IN 2004 MADE HER SICK, PT COULD NOT SPECIFY REACTION TYPES     Patient Measurements: Height: '5\' 1"'$  (154.9 cm) Weight: 46.7 kg (102 lb 15.3 oz) IBW/kg (Calculated) : 47.8 HEPARIN DW (KG): 42.6   Vital Signs: Temp: 97.8 F (36.6 C) (09/09 0700) Temp Source: Axillary (09/09 0700) BP: 127/42 (09/09 0400) Pulse Rate: 70 (09/09 0400)  Labs: Recent Labs    07/13/21 0300 07/13/21 0919 07/13/21 1612 07/14/21 0129 07/14/21 0130 07/14/21 0827  HGB 8.2*  --   --  7.0*  --  7.6*  HCT 25.4*  --   --  22.3*  --  23.7*  PLT 302  --   --  272  --  295  APTT  --  95*  --   --   --   --   HEPARINUNFRC 0.91* 0.56 0.29*  --  0.38 0.58  CREATININE 0.48 0.57 0.58 0.52  --   --     Estimated Creatinine Clearance: 38.6 mL/min (by C-G formula based on SCr of 0.52 mg/dL).   Medical History: Past Medical History:  Diagnosis Date   Adenomatous colon polyp    carcinoma in situ   AMS (altered mental status) 07/09/2021   Chronic abdominal pain    Dementia (HCC)    Diabetes mellitus    Fatty liver    on [place] normal LFT'S   GERD (gastroesophageal reflux disease)    Hyperlipidemia    Hypertension    Iron deficiency    Chronic current parameter normal; 4/09 iron was 12 sat 5%   SDH (subdural hematoma) ([place]) 01/2014   chronic    Assessment: 87 yr old woman was admitted with acute encephalopathy, sepsis, COVID-19 infection, sacral decubitus ulcer/cellulitis. MD concerned for PE. CT angiogram today showed large R-rided PE with evidence of R heart strain, consistent with at least submassive PE. CT angiogram. Dopplers showed age-indeterrminate thrombus in LLE (suggestive for chronic DVT, rather than acute DVT). D-dimer >20.0.    Heparin level 0.58 continues to be therapeutic. No bleeding issues noted. Need to continue to monitor hemoglobin- repeat hgb with improvement at 7.6  Goal of Therapy:  Heparin level 0.3-0.7 units/ml Monitor platelets by anticoagulation protocol: Yes   Plan:  Continue heparin infusion at 950 units/hr Monitor daily heparin level and CBC Monitor for bleeding F/U transition to oral anticoagulant when able  Margot Ables, PharmD Clinical Pharmacist 07/14/2021 10:16 AM

## 2021-07-14 NOTE — Progress Notes (Signed)
[place] for IV Heparin Indication: pulmonary embolus  Allergies  Allergen Reactions   Milk-Related Compounds Other (See Comments)    Lactose Intolerant    Iohexol Other (See Comments)     Desc: PT STATES THAT THE CONTRAST THAT SHE HAD IN 2004 MADE HER SICK, PT COULD NOT SPECIFY REACTION TYPES     Patient Measurements: Height: '5\' 1"'$  (154.9 cm) Weight: 45.6 kg (100 lb 8.5 oz) IBW/kg (Calculated) : 47.8 HEPARIN DW (KG): 42.6   Vital Signs: Temp: 98 F (36.7 C) (09/09 0031) Temp Source: Oral (09/09 0031) BP: 97/28 (09/09 0000) Pulse Rate: 56 (09/09 0100)  Labs: Recent Labs    07/12/21 0500 07/12/21 0619 07/12/21 0906 07/12/21 1715 07/13/21 0300 07/13/21 0919 07/13/21 1612 07/14/21 0129 07/14/21 0130  HGB 8.7*  --   --   --  8.2*  --   --  7.0*  --   HCT 26.6*  --  31.5*  --  25.4*  --   --  22.3*  --   PLT 299  --   --   --  302  --   --  272  --   APTT  --   --   --   --   --  95*  --   --   --   HEPARINUNFRC  --    < >  --    < > 0.91* 0.56 0.29*  --  0.38  CREATININE 0.55  --   --    < > 0.48 0.57 0.58  --   --    < > = values in this interval not displayed.    Estimated Creatinine Clearance: 37.7 mL/min (by C-G formula based on SCr of 0.58 mg/dL).   Medical History: Past Medical History:  Diagnosis Date   Adenomatous colon polyp    carcinoma in situ   AMS (altered mental status) 07/09/2021   Chronic abdominal pain    Dementia (HCC)    Diabetes mellitus    Fatty liver    on [place] normal LFT'S   GERD (gastroesophageal reflux disease)    Hyperlipidemia    Hypertension    Iron deficiency    Chronic current parameter normal; 4/09 iron was 12 sat 5%   SDH (subdural hematoma) ([place]) 01/2014   chronic    Assessment: 87 yr old woman was admitted with acute encephalopathy, sepsis, COVID-19 infection, sacral decubitus ulcer/cellulitis. MD concerned for PE. CT angiogram today showed large R-rided PE with evidence of R heart  strain, consistent with at least submassive PE. CT angiogram. Dopplers showed age-indeterrminate thrombus in LLE (suggestive for chronic DVT, rather than acute DVT). D-dimer >20.0.   Heparin level 0.38 continues to be therapeutic on 900 units/hr. No bleeding issues noted. Hgb is very concerning at 7. No bleeding issues noted.  Goal of Therapy:  Heparin level 0.3-0.7 units/ml Monitor platelets by anticoagulation protocol: Yes   Plan:  Continue heparin infusion at 950 units/hr Monitor daily heparin level and CBC, will recheck cbc in 6 hours given drop in hgb Monitor for bleeding F/U transition to oral anticoagulant when able  Erin Hearing PharmD., BCPS Clinical Pharmacist 07/14/2021 3:08 AM

## 2021-07-14 NOTE — Progress Notes (Signed)
  Speech Language Pathology Treatment: Dysphagia  Patient Details Name: Taylor Andrews MRN: [id number] DOB: April 17, 1934 Today's Date: 07/14/2021 Time: [id number] SLP Time Calculation (min) (ACUTE ONLY): 9 min  Assessment / Plan / Recommendation Clinical Impression  Pt was provided ongoing diagnostic dysphagia therapy; Pt was observed with RN providing thin liquids via cup. RN reports Pt continues to not drink out of a straw but does well via cup and is tolerating puree well and consuming majority of her trays. No overt s/sx of aspiration were noted with trials of liquids. Recommend continue with D1/puree diet and thin liquids. Recommend meds to be crushed in puree. There are no further ST needs noted at this time. ST to sign off.     HPI HPI: Taylor Andrews is a 87 y.o. BF PMHx dementia,, hypertension, DM type II controlled, fatty liver disease,  subdural hematoma 2015. Presented ED from nursing home with reports of altered mental status and cough.  At the time of my evaluation, patient is somnolent, not answering questions.  Patient was brought to the ED with reports of cough, change in mental status. Hospitalization 8/14-8/16 for dehydration and possible sepsis with increased procalcitonin, patient was started on Vanco and cefepime, brain MRI was unremarkable. COVID test later resulted positive. BSE requested.      SLP Plan  Continue with current plan of care       Recommendations  Diet recommendations: Dysphagia 1 (puree) Liquids provided via: Cup Medication Administration: Crushed with puree Supervision: Full supervision/cueing for compensatory strategies Compensations: Slow rate;Small sips/bites Postural Changes and/or Swallow Maneuvers: Seated upright 90 degrees;Upright 30-60 min after meal                Oral Care Recommendations: Staff/trained caregiver to provide oral care;Oral care BID Follow up Recommendations: 24 hour supervision/assistance SLP Visit Diagnosis: Dysphagia,  unspecified (R13.10) Plan: Continue with current plan of care       Taylor Andrews H. Roddie Mc, [place] Speech Language Pathologist    Taylor Andrews 07/14/2021, 2:53 PM

## 2021-07-14 NOTE — Progress Notes (Signed)
PROGRESS NOTE    Taylor Andrews  [id number] DOB: 02-Jun-1934 DOA: 07/09/2021 PCP: Redmond School, MD   Brief Narrative:  87 y.o. BF PMHx dementia,, hypertension, DM type II controlled, fatty liver disease,  subdural hematoma 2015 came from nursing home for altered mental status and cough.  Admitted 3 weeks ago for dehydration with possible sepsis initially on vancomycin and cefepime, MRI brain was unremarkable, cultures were negative and was discharged on antibiotics.  Admitted to the hospital for sepsis secondary to [id number] infection complicated by pulmonary embolism and Bil DVT. She was started on Heparin drip.      Assessment & Plan:   Principal Problem:   Acute encephalopathy Active Problems:   Dementia (HCC)   HTN (hypertension)   DM (diabetes mellitus), type 2 ([place])   COVID-19 virus infection   Severe sepsis (HCC)   AMS (altered mental status)   Decubitus ulcer of sacral region, unstageable ([place])   Sacral osteomyelitis ([place])   Cellulitis of sacral region   Left leg DVT ([place])   Pulmonary embolus ([place])   1)Severe sepsis secondary to cellulitis/infected sacral decubitus ulcer. -X-rays without evidence of osteomyelitis -Procalcitonin was elevated - PICC line- 07/12/21 -She completed vancomycin x 5 days  -Okay to continue Cipro for total of 7 days (last dose 07/16/21) -Leukocytosis persist due to steroids otherwise sepsis pathophysiology appears to be resolving  2)Acute hypoxic respiratory failure secondary to COVID-19 infection/Pneumonia---   -Completed IV remdesivir -Change IV Solu-Medrol to 40 mg every 12 hours --Check and trend inflammatory markers including D-dimer, ferritin and  CRP---also follow CBC and CMP --Supplemental oxygen to keep O2 sats above 93% -Follow serial chest x-rays and ABGs as indicated --- Encourage prone positioning for More than 16 hours/day in increments of 2 to 3 hours at a time if able to tolerate --Attempt to maintain euvolemic state --Zinc  and vitamin C as ordered -Albuterol inhaler as needed -Accu-Cheks/fingersticks while on high-dose steroids -PPI while on high-dose steroids - I-S/flutter valve, bronchodilators, vitamins -Hypoxia improving but not resolved  3)Right large pulmonary embolism with cor pulmonale, submassive PE Bilateral lower extremity DVT -- Lower extremity Dopplers-positive for bilateral DVT - Echocardiogram - ef 65%  - Given extensive clot burden we will continue IV heparin for up to 5 days prior to switching to p.o. Eliquis  -Monitor H&H  4) Unstageable sacral decubitus ulcer Pressure Injury 07/10/21 Coccyx Medial Unstageable - Full thickness tissue loss in which the base of the injury is covered by slough (yellow, tan, gray, green or brown) and/or eschar (tan, brown or black) in the wound bed. (Active)  07/10/21 0000  Location: Coccyx  Location Orientation: Medial  Staging: Unstageable - Full thickness tissue loss in which the base of the injury is covered by slough (yellow, tan, gray, green or brown) and/or eschar (tan, brown or black) in the wound bed.  Wound Description (Comments):   Present on Admission: Yes  -Antibiotics as above in #1  5)Acute metabolic encephalopathy-superimposed on underlying dementia--most likely due to #1 #2 #3 above -- At baseline can feed herself and occasionally recognize family members but unable to answer all questions.  Currently unable to do these things as well - CT head negative -Okay to restart Namenda and Aricept  6)Essential hypertension -Blood pressure stable without PTA BP meds -Continue to hold amlodipine and HCTZ  8)Diabetes mellitus type 2 -A1c 5.2 reflecting excellent diabetic control PTA -Current hyperglycemia most likely due to steroids for COVID infection -Sliding scale insulin adjusted downward  9)Social/ethics--palliative  care consult appreciated patient is a DNR  10)FTT- Nutritional status Nutrition Problem: Increased nutrient  needs Etiology: wound healing Signs/Symptoms: estimated needs Interventions: Ensure Enlive (each supplement provides 350kcal and 20 grams of protein), MVI, Magic cup Body mass index is 19.45 kg/m.  11)H/o subdural hematoma 2015----monitor closely while on anticoagulation  12) acute on chronic symptomatic anemia--- hemoglobin currently around 7, transfuse 1 unit of PRBC, watch closely especially while on anticoagulation, check stool occult blood  DVT prophylaxis: Heparin drip Code Status: DNR Family Communication:  her niece Andree Elk is Engineer, maintenance  Dispo: The patient is from:  [facility] assisted living              Anticipated d/c is to: Group home---[facility]               Patient currently is not medically stable to d/c.  Continue heparin drip due to significantly large clot burden.   Difficult to place patient No   Subjective: Very very poor historian -Denies chest pains  Review of Systems Otherwise negative except as per HPI, including: General = no fevers, chills, dizziness,  fatigue HEENT/EYES = negative for loss of vision, double vision, blurred vision,  sore throa Cardiovascular= negative for palpitation Respiratory/lungs= negative for shortness of breath, cough, wheezing; hemoptysis,  Gastrointestinal= negative for nausea, vomiting, abdominal pain Genitourinary= negative for Dysuria MSK = Negative for arthralgia, myalgias Neurology= Negative for headache, numbness, tingling  Psychiatry= Negative for suicidal and homocidal ideation Skin= Negative for Rash    Examination:  Physical Exam  Gen:- Awake Alert, pleasantly confused HEENT:- [place].AT, No sclera icterus Nose- [place] 2L/min Neck-Supple Neck,No JVD,.  Lungs-fair air movement bilaterally, no wheezing  CV- S1, S2 normal Abd-  +ve B.Sounds, Abd Soft, No tenderness,    Extremity/Skin:- No  edema,  , bilateral heel pressure injury Psych-baseline cognitive and memory deficits, poor insight and  judgment Neuro-generalized weakness, no new focal deficits, no tremors   Objective: Vitals:   07/14/21 0200 07/14/21 0400 07/14/21 0527 07/14/21 0700  BP: (!) 111/37 (!) 127/42    Pulse: 68 70    Resp: 14 (!) 22    Temp:   97.9 F (36.6 C) 97.8 F (36.6 C)  TempSrc:   Oral Axillary  SpO2: 98% 98%    Weight:   46.7 kg   Height:        Intake/Output Summary (Last 24 hours) at 07/14/2021 1157 Last data filed at 07/14/2021 0900 Gross per 24 hour  Intake 370 ml  Output 2450 ml  Net -2080 ml   Filed Weights   07/11/21 0500 07/13/21 0248 07/14/21 0527  Weight: 43.4 kg 45.6 kg 46.7 kg     Data Reviewed:   CBC: Recent Labs  Lab 07/10/21 0401 07/11/21 0442 07/12/21 0500 07/12/21 0906 07/13/21 0300 07/14/21 0129 07/14/21 0827  WBC 16.6* 19.5* 24.2*  --  24.5* 23.0* 26.7*  NEUTROABS 13.6* 17.5* 21.9*  --  22.3* 20.9*  --   HGB 9.9* 10.0* 8.7*  --  8.2* 7.0* 7.6*  HCT 32.0* 32.3* 26.6* 31.5* 25.4* 22.3* 23.7*  MCV 91.2 91.0 86.9  --  87.3 88.1 88.1  PLT 276 242 299  --  302 272 [id number]   Basic Metabolic Panel: Recent Labs  Lab 07/10/21 0401 07/11/21 0442 07/12/21 0500 07/12/21 1715 07/13/21 0300 07/13/21 0919 07/13/21 1612 07/14/21 0129  NA 143 142 139 136 139 139 142 135  K 3.5 3.3* 2.7* 3.5 2.8* 3.8 3.2* 4.0  CL 107 107  107 108 115* 109 110 107  CO2 '30 25 26 25 '$ 21* '23 24 23  '$ GLUCOSE 141* 83 137* 185* 197* 135* 132* 283*  BUN '20 17 15 17 14 15 13 11  '$ CREATININE 0.73 0.61 0.55 0.58 0.48 0.57 0.58 0.52  CALCIUM 8.6* 8.4* 7.7* 7.3* 6.4* 7.8* 8.8* 7.0*  MG 2.3 2.3 2.1  --  1.7  --   --  2.1  PHOS 2.3* 2.6 2.3*  --  2.3*  --   --  4.4   GFR: Estimated Creatinine Clearance: 38.6 mL/min (by C-G formula based on SCr of 0.52 mg/dL). Liver Function Tests: Recent Labs  Lab 07/11/21 0442 07/12/21 0500 07/13/21 0300 07/13/21 0919 07/14/21 0129  AST '22 15 19 22 16  '$ ALT '16 15 16 19 12  '$ ALKPHOS 70 56 54 64 43  BILITOT 0.2* 0.4 0.2* 0.3 0.2*  PROT 6.1* 5.2* 4.7* 5.5*  6.0*  ALBUMIN 2.1* 1.9* 1.7* 2.0* 3.9   No results for input(s): LIPASE, AMYLASE in the last 168 hours. No results for input(s): AMMONIA in the last 168 hours. Coagulation Profile: No results for input(s): INR, PROTIME in the last 168 hours. Cardiac Enzymes: No results for input(s): CKTOTAL, CKMB, CKMBINDEX, TROPONINI in the last 168 hours. BNP (last 3 results) No results for input(s): PROBNP in the last 8760 hours. HbA1C: No results for input(s): HGBA1C in the last 72 hours. CBG: Recent Labs  Lab 07/13/21 1530 07/13/21 1959 07/14/21 0026 07/14/21 0557 07/14/21 0813  GLUCAP 139* 240* 270* 71 92   Lipid Profile: No results for input(s): CHOL, HDL, LDLCALC, TRIG, CHOLHDL, LDLDIRECT in the last 72 hours. Thyroid Function Tests: Recent Labs    07/12/21 0619  TSH 0.338*   Anemia Panel: Recent Labs    07/12/21 0619 07/13/21 0300 07/14/21 0129  VITAMINB12 660  --   --   FERRITIN 606* 419* 461*   Sepsis Labs: Recent Labs  Lab 07/09/21 1801 07/10/21 0401 07/10/21 0903 07/10/21 1503 07/11/21 0442 07/11/21 2029 07/11/21 2338 07/12/21 0239 07/12/21 0500  PROCALCITON 1.17 0.85  --   --  1.02  --   --   --  0.56  LATICACIDVEN  --   --    < > 3.0*  --  1.2 1.3 1.8  --    < > = values in this interval not displayed.    Recent Results (from the past 240 hour(s))  Resp Panel by RT-PCR (Flu A&B, Covid) Nasopharyngeal Swab     Status: Abnormal   Collection Time: 07/09/21  5:41 PM   Specimen: Nasopharyngeal Swab; Nasopharyngeal(NP) swabs in vial transport medium  Result Value Ref Range Status   SARS Coronavirus 2 by RT PCR POSITIVE (A) NEGATIVE Final    Comment: CRITICAL RESULT CALLED TO, READ BACK BY AND VERIFIED WITH: [address] 07/09/2021 COLEMAN,R (NOTE) SARS-CoV-2 target nucleic acids are DETECTED.  The SARS-CoV-2 RNA is generally detectable in upper respiratory specimens during the acute phase of infection. Positive results are indicative of the presence of  the identified virus, but do not rule out bacterial infection or co-infection with other pathogens not detected by the test. Clinical correlation with patient history and other diagnostic information is necessary to determine patient infection status. The expected result is Negative.  Fact Sheet for Patients: EntrepreneurPulse.com.au  Fact Sheet for Healthcare Providers: IncredibleEmployment.be  This test is not yet approved or cleared by the [place] FDA and  has been authorized for detection and/or diagnosis of SARS-CoV-2 by FDA under  an Emergency Use Authorization (EUA).  This EUA will remain in effect (meaning this t est can be used) for the duration of  the COVID-19 declaration under Section 564(b)(1) of the Act, 21 U.S.C. section 360bbb-3(b)(1), unless the authorization is terminated or revoked sooner.     Influenza A by PCR NEGATIVE NEGATIVE Final   Influenza B by PCR NEGATIVE NEGATIVE Final    Comment: (NOTE) The Xpert Xpress SARS-CoV-2/FLU/RSV plus assay is intended as an aid in the diagnosis of influenza from Nasopharyngeal swab specimens and should not be used as a sole basis for treatment. Nasal washings and aspirates are unacceptable for Xpert Xpress SARS-CoV-2/FLU/RSV testing.  Fact Sheet for Patients: EntrepreneurPulse.com.au  Fact Sheet for Healthcare Providers: IncredibleEmployment.be  This test is not yet approved or cleared by the [place] FDA and has been authorized for detection and/or diagnosis of SARS-CoV-2 by FDA under an Emergency Use Authorization (EUA). This EUA will remain in effect (meaning this test can be used) for the duration of the COVID-19 declaration under Section 564(b)(1) of the Act, 21 U.S.C. section 360bbb-3(b)(1), unless the authorization is terminated or revoked.  Performed at [facility] [address] [place], [place] [address]   Culture, blood  (routine x 2)     Status: None   Collection Time: 07/09/21  5:50 PM   Specimen: Right Antecubital; Blood  Result Value Ref Range Status   Specimen Description RIGHT ANTECUBITAL  Final   Special Requests   Final    BOTTLES DRAWN AEROBIC AND ANAEROBIC Blood Culture adequate volume   Culture   Final    NO GROWTH 5 DAYS Performed at [facility], [address] [place], [place] [address]    Report Status 07/14/2021 FINAL  Final  Culture, blood (routine x 2)     Status: None   Collection Time: 07/09/21  6:14 PM   Specimen: BLOOD RIGHT ARM  Result Value Ref Range Status   Specimen Description BLOOD RIGHT ARM  Final   Special Requests   Final    BOTTLES DRAWN AEROBIC ONLY Blood Culture adequate volume   Culture   Final    NO GROWTH 5 DAYS Performed at [facility], [address] [place], [place] [address]    Report Status 07/14/2021 FINAL  Final  MRSA Next Gen by PCR, Nasal     Status: None   Collection Time: 07/09/21 10:44 PM   Specimen: Nasal Mucosa; Nasal Swab  Result Value Ref Range Status   MRSA by PCR Next Gen NOT DETECTED NOT DETECTED Final    Comment: (NOTE) The GeneXpert MRSA Assay (FDA approved for NASAL specimens only), is one component of a comprehensive MRSA colonization surveillance program. It is not intended to diagnose MRSA infection nor to guide or monitor treatment for MRSA infections. Test performance is not FDA approved in patients less than 65 years old. Performed at [facility], [address] [place], [place] [address]       Radiology Studies: No results found.  Scheduled Meds:  sodium chloride   Intravenous Once   vitamin C  500 mg Oral BID   Chlorhexidine Gluconate Cloth  6 each Topical Daily   collagenase   Topical Daily   donepezil  5 mg Oral QHS   feeding supplement  1 Container Oral TID BM   furosemide  20 mg Intravenous Once   insulin aspart  0-5 Units Subcutaneous QHS   insulin aspart  0-6 Units Subcutaneous TID WC   memantine  5 mg  Oral  BID   methylPREDNISolone (SOLU-MEDROL) injection  40 mg Intravenous Q12H   multivitamin-lutein  1 capsule Oral Daily   sodium chloride flush  10-40 mL Intracatheter Q12H   zinc sulfate  220 mg Oral Daily   Continuous Infusions:  ciprofloxacin 400 mg (07/14/21 0539)   heparin 950 Units/hr (07/13/21 1758)   remdesivir 100 mg in NS 100 mL 100 mg (07/13/21 1115)     LOS: 4 days   Roxan Hockey, MD [facility]  If 7PM-7AM, please contact night-coverage  07/14/2021, 11:57 AM

## 2021-07-15 LAB — CBC WITH DIFFERENTIAL/PLATELET
Abs Immature Granulocytes: 1.09 10*3/uL — ABNORMAL HIGH (ref 0.00–0.07)
Basophils Absolute: 0.1 10*3/uL (ref 0.0–0.1)
Basophils Relative: 0 %
Eosinophils Absolute: 0 10*3/uL (ref 0.0–0.5)
Eosinophils Relative: 0 %
HCT: 29.2 % — ABNORMAL LOW (ref 36.0–46.0)
Hemoglobin: 9.7 g/dL — ABNORMAL LOW (ref 12.0–15.0)
Immature Granulocytes: 4 %
Lymphocytes Relative: 4 %
Lymphs Abs: 1.1 10*3/uL (ref 0.7–4.0)
MCH: 28.1 pg (ref 26.0–34.0)
MCHC: 33.2 g/dL (ref 30.0–36.0)
MCV: 84.6 fL (ref 80.0–100.0)
Monocytes Absolute: 1.2 10*3/uL — ABNORMAL HIGH (ref 0.1–1.0)
Monocytes Relative: 4 %
Neutro Abs: 27 10*3/uL — ABNORMAL HIGH (ref 1.7–7.7)
Neutrophils Relative %: 88 %
Platelets: 299 10*3/uL (ref 150–400)
RBC: 3.45 MIL/uL — ABNORMAL LOW (ref 3.87–5.11)
RDW: 14.4 % (ref 11.5–15.5)
WBC: 30.5 10*3/uL — ABNORMAL HIGH (ref 4.0–10.5)
nRBC: 0.9 % — ABNORMAL HIGH (ref 0.0–0.2)

## 2021-07-15 LAB — VITAMIN B12: Vitamin B-12: 769 pg/mL (ref 180–914)

## 2021-07-15 LAB — FERRITIN: Ferritin: 681 ng/mL — ABNORMAL HIGH (ref 11–307)

## 2021-07-15 LAB — IRON AND TIBC
Iron: 29 ug/dL (ref 28–170)
Saturation Ratios: 31 % (ref 10.4–31.8)
TIBC: 95 ug/dL — ABNORMAL LOW (ref 250–450)
UIBC: 66 ug/dL

## 2021-07-15 LAB — GLUCOSE, CAPILLARY
Glucose-Capillary: 197 mg/dL — ABNORMAL HIGH (ref 70–99)
Glucose-Capillary: 268 mg/dL — ABNORMAL HIGH (ref 70–99)
Glucose-Capillary: 198 mg/dL — ABNORMAL HIGH (ref 70–99)
Glucose-Capillary: 166 mg/dL — ABNORMAL HIGH (ref 70–99)

## 2021-07-15 LAB — COMPREHENSIVE METABOLIC PANEL
ALT: 15 U/L (ref 0–44)
AST: 15 U/L (ref 15–41)
Albumin: 2.5 g/dL — ABNORMAL LOW (ref 3.5–5.0)
Alkaline Phosphatase: 50 U/L (ref 38–126)
Anion gap: 7 (ref 5–15)
BUN: 16 mg/dL (ref 8–23)
CO2: 26 mmol/L (ref 22–32)
Calcium: 7.8 mg/dL — ABNORMAL LOW (ref 8.9–10.3)
Chloride: 106 mmol/L (ref 98–111)
Creatinine, Ser: 0.57 mg/dL (ref 0.44–1.00)
GFR, Estimated: >60 mL/min (ref >60)
Glucose, Bld: 162 mg/dL — ABNORMAL HIGH (ref 70–99)
Potassium: 2.9 mmol/L — ABNORMAL LOW (ref 3.5–5.1)
Sodium: 139 mmol/L (ref 135–145)
Total Bilirubin: 0.8 mg/dL (ref 0.3–1.2)
Total Protein: 4.8 g/dL — ABNORMAL LOW (ref 6.5–8.1)

## 2021-07-15 LAB — MAGNESIUM: Magnesium: 2 mg/dL (ref 1.7–2.4)

## 2021-07-15 LAB — D-DIMER, QUANTITATIVE: D-Dimer, Quant: 2.6 ug/mL-FEU — ABNORMAL HIGH (ref 0.00–0.50)

## 2021-07-15 LAB — LACTATE DEHYDROGENASE: LDH: 184 U/L (ref 98–192)

## 2021-07-15 LAB — FOLATE: Folate: 5.4 ng/mL — ABNORMAL LOW (ref >5.9)

## 2021-07-15 LAB — C-REACTIVE PROTEIN: CRP: 6.4 mg/dL — ABNORMAL HIGH (ref <1.0)

## 2021-07-15 LAB — CALCIUM, IONIZED: Calcium, Ionized, Serum: 5 mg/dL (ref 4.5–5.6)

## 2021-07-15 LAB — HEPARIN LEVEL (UNFRACTIONATED): Heparin Unfractionated: 0.64 IU/mL (ref 0.30–0.70)

## 2021-07-15 LAB — PHOSPHORUS: Phosphorus: 2.5 mg/dL (ref 2.5–4.6)

## 2021-07-15 MED ORDER — FOLIC ACID 1 MG PO TABS
1.0000 mg | ORAL_TABLET | Freq: Every day | ORAL | Status: DC
  Administered 2021-07-15 – 2021-07-20 (×6): 1 mg via ORAL
  Filled 2021-07-15 (×5): qty 1

## 2021-07-15 MED ORDER — POTASSIUM CHLORIDE 10 MEQ/100ML IV SOLN
10.0000 meq | INTRAVENOUS | Status: AC
  Administered 2021-07-15 (×4): 10 meq via INTRAVENOUS

## 2021-07-15 NOTE — Progress Notes (Signed)
PROGRESS NOTE    Taylor Andrews  [id number] DOB: 10-23-1934 DOA: 07/09/2021 PCP: [employer]mond [employer], MD   Brief Narrative:  87 y.o. BF PMHx dementia,, hypertension, DM type II controlled, fatty liver disease,  subdural hematoma 2015 came from nursing home for altered mental status and cough.  Admitted 3 weeks ago for dehydration with possible sepsis initially on vancomycin and cefepime, MRI brain was unremarkable, cultures were negative and was discharged on antibiotics.  Admitted to the hospital for sepsis secondary to [id number] infection complicated by pulmonary embolism and Bil DVT. She was started on Heparin drip.    Assessment & Plan:   Principal Problem:   Acute encephalopathy Active Problems:   Dementia (HCC)   HTN (hypertension)   DM (diabetes mellitus), type 2 ([place])   COVID-19 virus infection   Severe sepsis (HCC)   AMS (altered mental status)   Decubitus ulcer of sacral region, unstageable ([place])   Sacral osteomyelitis ([place])   Cellulitis of sacral region   Left leg DVT ([place])   Pulmonary embolus ([place])   1)Severe sepsis secondary to cellulitis/infected sacral decubitus ulcer. -X-rays without evidence of osteomyelitis -Procalcitonin was elevated - PICC line- 07/12/21 -She completed vancomycin x 5 days  -Okay to continue Cipro for total of 7 days (last dose 07/16/21) -Leukocytosis persist due to steroids otherwise sepsis pathophysiology appears to be resolving  2)Acute hypoxic respiratory failure secondary to COVID-19 infection/Pneumonia---   -Completed IV remdesivir -Change IV Solu-Medrol to 40 mg every 12 hours --Check and trend inflammatory markers including D-dimer, ferritin and  CRP---also follow CBC and CMP --Supplemental oxygen to keep O2 sats above 93% -Follow serial chest x-rays and ABGs as indicated --- Encourage prone positioning for More than 16 hours/day in increments of 2 to 3 hours at a time if able to tolerate --Attempt to maintain euvolemic state --Zinc and  vitamin C as ordered -Albuterol inhaler as needed -Accu-Cheks/fingersticks while on high-dose steroids -PPI while on high-dose steroids - I-S/flutter valve, bronchodilators, vitamins -Hypoxia improving but not resolved  3)Right large pulmonary embolism with cor pulmonale, submassive PE Bilateral lower extremity DVT -- Lower extremity Dopplers-positive for bilateral DVT - Echocardiogram - ef 65%  - Given extensive clot burden we will continue IV heparin for up to 5 days prior to switching to p.o. Eliquis on 07/16/21 --Hypoxia improving but not resolved -Monitor H&H  4) Unstageable sacral decubitus ulcer Pressure Injury 07/10/21 Coccyx Medial Unstageable - Full thickness tissue loss in which the base of the injury is covered by slough (yellow, tan, gray, green or brown) and/or eschar (tan, brown or black) in the wound bed. (Active)  07/10/21 0000  Location: Coccyx  Location Orientation: Medial  Staging: Unstageable - Full thickness tissue loss in which the base of the injury is covered by slough (yellow, tan, gray, green or brown) and/or eschar (tan, brown or black) in the wound bed.  Wound Description (Comments):   Present on Admission: Yes  -Antibiotics as above in #1  5)Acute metabolic encephalopathy-superimposed on underlying dementia--most likely due to #1 #2 #3 above -- At baseline can feed herself and occasionally recognize family members but unable to answer all questions.  Currently unable to do these things as well - CT head negative -c/n Namenda and Aricept  6)Essential hypertension -Blood pressure stable without PTA BP meds -Continue to hold amlodipine and HCTZ  8)Diabetes mellitus type 2 -A1c 5.2 reflecting excellent diabetic control PTA -Current hyperglycemia most likely due to steroids for COVID infection -Sliding scale insulin adjusted downward  9)Social/ethics--palliative care consult appreciated patient is a DNR  10)FTT- Nutritional status Nutrition Problem:  Increased nutrient needs Etiology: wound healing Signs/Symptoms: estimated needs Interventions: Ensure Enlive (each supplement provides 350kcal and 20 grams of protein), MVI, Magic cup Body mass index is 19.45 kg/m.  11)H/o subdural hematoma 2015----monitor closely while on anticoagulation  12) acute on chronic symptomatic anemia--- hemoglobin up above 9 after transfusion of PRBC on 07/14/2021  watch closely especially while on anticoagulation,  stool occult blood requested  DVT prophylaxis: Heparin drip Code Status: DNR Family Communication:  her niece Andree Elk is Engineer, maintenance  Dispo: The patient is from:  [facility] assisted living              Anticipated d/c is to: Group home---[facility]               Patient currently is not medically stable to d/c.  Continue heparin drip due to significantly large clot burden.   Difficult to place patient No   Subjective: Very very poor historian -Denies chest pains -Resting comfortably  Examination:  Physical Exam  Gen:- Awake Alert, pleasantly confused HEENT:- [place].AT, No sclera icterus Nose- [place] 2L/min Neck-Supple Neck,No JVD,.  Lungs-fair air movement bilaterally, no wheezing  CV- S1, S2 normal Abd-  +ve B.Sounds, Abd Soft, No tenderness,    Extremity/Skin:- No  edema,  , bilateral heel pressure injury, sacral decub Psych-baseline cognitive and memory deficits, poor insight and judgment Neuro-generalized weakness, no new focal deficits, no tremors   Objective: Vitals:   07/15/21 1500 07/15/21 1600 07/15/21 1700 07/15/21 1715  BP: (!) 147/51 (!) 129/34 (!) 138/31   Pulse: 67 64 63 72  Resp: 18 (!) '26 16 17  '$ Temp:    97.6 F (36.4 C)  TempSrc:    Axillary  SpO2: 99% 99% 100% (!) 72%  Weight:      Height:        Intake/Output Summary (Last 24 hours) at 07/15/2021 1807 Last data filed at 07/15/2021 1748 Gross per 24 hour  Intake 1797.68 ml  Output 1200 ml  Net 597.68 ml   Filed Weights   07/11/21 0500 07/13/21  0248 07/14/21 0527  Weight: 43.4 kg 45.6 kg 46.7 kg     Data Reviewed:   CBC: Recent Labs  Lab 07/11/21 0442 07/12/21 0500 07/12/21 0906 07/13/21 0300 07/14/21 0129 07/14/21 0827 07/15/21 0317  WBC 19.5* 24.2*  --  24.5* 23.0* 26.7* 30.5*  NEUTROABS 17.5* 21.9*  --  22.3* 20.9*  --  27.0*  HGB 10.0* 8.7*  --  8.2* 7.0* 7.6* 9.7*  HCT 32.3* 26.6* 31.5* 25.4* 22.3* 23.7* 29.2*  MCV 91.0 86.9  --  87.3 88.1 88.1 84.6  PLT 242 299  --  302 272 295 [id number]   Basic Metabolic Panel: Recent Labs  Lab 07/11/21 0442 07/12/21 0500 07/12/21 1715 07/13/21 0300 07/13/21 0919 07/13/21 1612 07/14/21 0129 07/15/21 0317  NA 142 139   < > 139 139 142 135 139  K 3.3* 2.7*   < > 2.8* 3.8 3.2* 4.0 2.9*  CL 107 107   < > 115* 109 110 107 106  CO2 25 26   < > 21* '23 24 23 26  '$ GLUCOSE 83 137*   < > 197* 135* 132* 283* 162*  BUN 17 15   < > '14 15 13 11 16  '$ CREATININE 0.61 0.55   < > 0.48 0.57 0.58 0.52 0.57  CALCIUM 8.4* 7.7*   < > 6.4* 7.8* 8.8*  7.0* 7.8*  MG 2.3 2.1  --  1.7  --   --  2.1 2.0  PHOS 2.6 2.3*  --  2.3*  --   --  4.4 2.5   < > = values in this interval not displayed.   GFR: Estimated Creatinine Clearance: 38.6 mL/min (by C-G formula based on SCr of 0.57 mg/dL). Liver Function Tests: Recent Labs  Lab 07/12/21 0500 07/13/21 0300 07/13/21 0919 07/14/21 0129 07/15/21 0317  AST '15 19 22 16 15  '$ ALT '15 16 19 12 15  '$ ALKPHOS 56 54 64 43 50  BILITOT 0.4 0.2* 0.3 0.2* 0.8  PROT 5.2* 4.7* 5.5* 6.0* 4.8*  ALBUMIN 1.9* 1.7* 2.0* 3.9 2.5*   No results for input(s): LIPASE, AMYLASE in the last 168 hours. No results for input(s): AMMONIA in the last 168 hours. Coagulation Profile: No results for input(s): INR, PROTIME in the last 168 hours. Cardiac Enzymes: No results for input(s): CKTOTAL, CKMB, CKMBINDEX, TROPONINI in the last 168 hours. BNP (last 3 results) No results for input(s): PROBNP in the last 8760 hours. HbA1C: No results for input(s): HGBA1C in the last 72  hours. CBG: Recent Labs  Lab 07/14/21 1814 07/14/21 2009 07/15/21 0802 07/15/21 1145 07/15/21 1713  GLUCAP 237* 251* 197* 268* 198*   Lipid Profile: No results for input(s): CHOL, HDL, LDLCALC, TRIG, CHOLHDL, LDLDIRECT in the last 72 hours. Thyroid Function Tests: No results for input(s): TSH, T4TOTAL, FREET4, T3FREE, THYROIDAB in the last 72 hours.  Anemia Panel: Recent Labs    07/14/21 0129 07/15/21 0317  VITAMINB12  --  769  FOLATE  --  5.4*  FERRITIN 461* 681*  TIBC  --  95*  IRON  --  29   Sepsis Labs: Recent Labs  Lab 07/09/21 1801 07/10/21 0401 07/10/21 0903 07/10/21 1503 07/11/21 0442 07/11/21 2029 07/11/21 2338 07/12/21 0239 07/12/21 0500  PROCALCITON 1.17 0.85  --   --  1.02  --   --   --  0.56  LATICACIDVEN  --   --    < > 3.0*  --  1.2 1.3 1.8  --    < > = values in this interval not displayed.    Recent Results (from the past 240 hour(s))  Resp Panel by RT-PCR (Flu A&B, Covid) Nasopharyngeal Swab     Status: Abnormal   Collection Time: 07/09/21  5:41 PM   Specimen: Nasopharyngeal Swab; Nasopharyngeal(NP) swabs in vial transport medium  Result Value Ref Range Status   SARS Coronavirus 2 by RT PCR POSITIVE (A) NEGATIVE Final    Comment: CRITICAL RESULT CALLED TO, READ BACK BY AND VERIFIED WITH: [place] 07/09/2021 COLEMAN,R (NOTE) SARS-CoV-2 target nucleic acids are DETECTED.  The SARS-CoV-2 RNA is generally detectable in upper respiratory specimens during the acute phase of infection. Positive results are indicative of the presence of the identified virus, but do not rule out bacterial infection or co-infection with other pathogens not detected by the test. Clinical correlation with patient history and other diagnostic information is necessary to determine patient infection status. The expected result is Negative.  Fact Sheet for Patients: EntrepreneurPulse.com.au  Fact Sheet for Healthcare  Providers: IncredibleEmployment.be  This test is not yet approved or cleared by the [place] FDA and  has been authorized for detection and/or diagnosis of SARS-CoV-2 by FDA under an Emergency Use Authorization (EUA).  This EUA will remain in effect (meaning this t est can be used) for the duration of  the COVID-19 declaration under  Section 564(b)(1) of the Act, 21 U.S.C. section 360bbb-3(b)(1), unless the authorization is terminated or revoked sooner.     Influenza A by PCR NEGATIVE NEGATIVE Final   Influenza B by PCR NEGATIVE NEGATIVE Final    Comment: (NOTE) The Xpert Xpress SARS-CoV-2/FLU/RSV plus assay is intended as an aid in the diagnosis of influenza from Nasopharyngeal swab specimens and should not be used as a sole basis for treatment. Nasal washings and aspirates are unacceptable for Xpert Xpress SARS-CoV-2/FLU/RSV testing.  Fact Sheet for Patients: EntrepreneurPulse.com.au  Fact Sheet for Healthcare Providers: IncredibleEmployment.be  This test is not yet approved or cleared by the [place] FDA and has been authorized for detection and/or diagnosis of SARS-CoV-2 by FDA under an Emergency Use Authorization (EUA). This EUA will remain in effect (meaning this test can be used) for the duration of the COVID-19 declaration under Section 564(b)(1) of the Act, 21 U.S.C. section 360bbb-3(b)(1), unless the authorization is terminated or revoked.  Performed at [facility], [address] [place], [place] [address]   Culture, blood (routine x 2)     Status: None   Collection Time: 07/09/21  5:50 PM   Specimen: Right Antecubital; Blood  Result Value Ref Range Status   Specimen Description RIGHT ANTECUBITAL  Final   Special Requests   Final    BOTTLES DRAWN AEROBIC AND ANAEROBIC Blood Culture adequate volume   Culture   Final    NO GROWTH 5 DAYS Performed at [facility], [address], [place], [place]  [address]    Report Status 07/14/2021 FINAL  Final  Culture, blood (routine x 2)     Status: None   Collection Time: 07/09/21  6:14 PM   Specimen: BLOOD RIGHT ARM  Result Value Ref Range Status   Specimen Description BLOOD RIGHT ARM  Final   Special Requests   Final    BOTTLES DRAWN AEROBIC ONLY Blood Culture adequate volume   Culture   Final    NO GROWTH 5 DAYS Performed at [facility], [address], [place], [place] [address]    Report Status 07/14/2021 FINAL  Final  MRSA Next Gen by PCR, Nasal     Status: None   Collection Time: 07/09/21 10:44 PM   Specimen: Nasal Mucosa; Nasal Swab  Result Value Ref Range Status   MRSA by PCR Next Gen NOT DETECTED NOT DETECTED Final    Comment: (NOTE) The GeneXpert MRSA Assay (FDA approved for NASAL specimens only), is one component of a comprehensive MRSA colonization surveillance program. It is not intended to diagnose MRSA infection nor to guide or monitor treatment for MRSA infections. Test performance is not FDA approved in patients less than 85 years old. Performed at [facility], [address] [place],  [address]       Radiology Studies: No results found.  Scheduled Meds:  vitamin C  500 mg Oral BID   Chlorhexidine Gluconate Cloth  6 each Topical Daily   collagenase   Topical Daily   donepezil  5 mg Oral QHS   feeding supplement  1 Container Oral TID BM   folic acid  1 mg Oral Daily   insulin aspart  0-5 Units Subcutaneous QHS   insulin aspart  0-6 Units Subcutaneous TID WC   memantine  5 mg Oral BID   methylPREDNISolone (SOLU-MEDROL) injection  40 mg Intravenous Q12H   multivitamin-lutein  1 capsule Oral Daily   sodium chloride flush  10-40 mL Intracatheter Q12H   zinc sulfate  220 mg Oral Daily   Continuous Infusions:  ciprofloxacin 400 mg (07/15/21 1719)   heparin 950 Units/hr (07/15/21 0600)     LOS: 5 days   Roxan Hockey, MD [facility]  If 7PM-7AM, please contact night-coverage  07/15/2021,  6:07 PM

## 2021-07-15 NOTE — Progress Notes (Signed)
[place] for IV Heparin Indication: pulmonary embolus  Allergies  Allergen Reactions   Milk-Related Compounds Other (See Comments)    Lactose Intolerant    Iohexol Other (See Comments)     Desc: PT STATES THAT THE CONTRAST THAT SHE HAD IN 2004 MADE HER SICK, PT COULD NOT SPECIFY REACTION TYPES     Patient Measurements: Height: '5\' 1"'$  (154.9 cm) Weight: 46.7 kg (102 lb 15.3 oz) IBW/kg (Calculated) : 47.8 HEPARIN DW (KG): 42.6   Vital Signs: Temp: 97.9 F (36.6 C) (09/10 0800) Temp Source: Axillary (09/10 0800) BP: 132/27 (09/10 0800) Pulse Rate: 58 (09/10 0800)  Labs: Recent Labs    07/13/21 0919 07/13/21 1612 07/14/21 0129 07/14/21 0130 07/14/21 0827 07/15/21 0317  HGB  --   --  7.0*  --  7.6* 9.7*  HCT  --   --  22.3*  --  23.7* 29.2*  PLT  --   --  272  --  295 299  APTT 95*  --   --   --   --   --   HEPARINUNFRC 0.56 0.29*  --  0.38 0.58 0.64  CREATININE 0.57 0.58 0.52  --   --  0.57    Estimated Creatinine Clearance: 38.6 mL/min (by C-G formula based on SCr of 0.57 mg/dL).   Medical History: Past Medical History:  Diagnosis Date   Adenomatous colon polyp    carcinoma in situ   AMS (altered mental status) 07/09/2021   Chronic abdominal pain    Dementia (HCC)    Diabetes mellitus    Fatty liver    on [place] normal LFT'S   GERD (gastroesophageal reflux disease)    Hyperlipidemia    Hypertension    Iron deficiency    Chronic current parameter normal; 4/09 iron was 12 sat 5%   SDH (subdural hematoma) ([place]) 01/2014   chronic    Assessment: 87 yr old woman was admitted with acute encephalopathy, sepsis, COVID-19 infection, sacral decubitus ulcer/cellulitis. MD concerned for PE. CT angiogram today showed large R-rided PE with evidence of R heart strain, consistent with at least submassive PE. CT angiogram. Dopplers showed age-indeterrminate thrombus in LLE (suggestive for chronic DVT, rather than acute DVT). D-dimer >20.0.    Heparin level continues to be therapeutic. CBC improved. No bleeding issues noted. Need to continue to monitor hemoglobin, monitor CBC daily.  Goal of Therapy:  Heparin level 0.3-0.7 units/ml Monitor platelets by anticoagulation protocol: Yes   Plan:  Continue heparin infusion at 950 units/hr Monitor daily heparin level and CBC Monitor for bleeding F/U transition to oral anticoagulant when able  Hart Robinsons, PharmD Clinical Pharmacist 07/15/2021 8:25 AM

## 2021-07-16 LAB — BASIC METABOLIC PANEL
Anion gap: 8 (ref 5–15)
BUN: 18 mg/dL (ref 8–23)
CO2: 27 mmol/L (ref 22–32)
Calcium: 7.9 mg/dL — ABNORMAL LOW (ref 8.9–10.3)
Chloride: 105 mmol/L (ref 98–111)
Creatinine, Ser: 0.56 mg/dL (ref 0.44–1.00)
GFR, Estimated: >60 mL/min (ref >60)
Glucose, Bld: 196 mg/dL — ABNORMAL HIGH (ref 70–99)
Potassium: 3.4 mmol/L — ABNORMAL LOW (ref 3.5–5.1)
Sodium: 140 mmol/L (ref 135–145)

## 2021-07-16 LAB — TYPE AND SCREEN
ABO/RH(D): AB POS
Antibody Screen: NEGATIVE
Unit division: 0

## 2021-07-16 LAB — HEPARIN LEVEL (UNFRACTIONATED): Heparin Unfractionated: 1.07 IU/mL — ABNORMAL HIGH (ref 0.30–0.70)

## 2021-07-16 LAB — CBC
HCT: 32.3 % — ABNORMAL LOW (ref 36.0–46.0)
Hemoglobin: 10.6 g/dL — ABNORMAL LOW (ref 12.0–15.0)
MCH: 28.1 pg (ref 26.0–34.0)
MCHC: 32.8 g/dL (ref 30.0–36.0)
MCV: 85.7 fL (ref 80.0–100.0)
Platelets: 297 10*3/uL (ref 150–400)
RBC: 3.77 MIL/uL — ABNORMAL LOW (ref 3.87–5.11)
RDW: 15.4 % (ref 11.5–15.5)
WBC: 35.2 10*3/uL — ABNORMAL HIGH (ref 4.0–10.5)
nRBC: 0.2 % (ref 0.0–0.2)

## 2021-07-16 LAB — BPAM RBC
Blood Product Expiration Date: 202210102359
ISSUE DATE / TIME: 202209091232
Unit Type and Rh: 6200

## 2021-07-16 LAB — GLUCOSE, CAPILLARY
Glucose-Capillary: 191 mg/dL — ABNORMAL HIGH (ref 70–99)
Glucose-Capillary: 192 mg/dL — ABNORMAL HIGH (ref 70–99)
Glucose-Capillary: 171 mg/dL — ABNORMAL HIGH (ref 70–99)
Glucose-Capillary: 276 mg/dL — ABNORMAL HIGH (ref 70–99)

## 2021-07-16 LAB — LACTATE DEHYDROGENASE: LDH: 191 U/L (ref 98–192)

## 2021-07-16 MED ORDER — APIXABAN 5 MG PO TABS: 5.0000 mg | ORAL_TABLET | Freq: Two times a day (BID) | ORAL | Status: DC

## 2021-07-16 MED ORDER — APIXABAN 5 MG PO TABS
10.0000 mg | ORAL_TABLET | Freq: Two times a day (BID) | ORAL | Status: DC
  Administered 2021-07-16 – 2021-07-20 (×8): 10 mg via ORAL
  Filled 2021-07-16 (×8): qty 2

## 2021-07-16 MED ORDER — POTASSIUM CHLORIDE 10 MEQ/100ML IV SOLN
10.0000 meq | INTRAVENOUS | Status: AC
Start: 2021-07-16 — End: 2021-07-16
  Administered 2021-07-16 (×4): 10 meq via INTRAVENOUS
  Filled 2021-07-16: qty 100

## 2021-07-16 NOTE — Progress Notes (Signed)
PROGRESS NOTE    Taylor Andrews  [id number] DOB: 09/23/1934 DOA: 07/09/2021 PCP: [employer], MD   Brief Narrative:  87 y.o. BF PMHx dementia,, hypertension, DM type II controlled, fatty liver disease,  subdural hematoma 2015 came from nursing home for altered mental status and cough.  Admitted 3 weeks ago for dehydration with possible sepsis initially on vancomycin and cefepime, MRI brain was unremarkable, cultures were negative and was discharged on antibiotics.  Admitted to the hospital for sepsis secondary to [id number] infection complicated by pulmonary embolism and Bil DVT. She was started on Heparin drip.    Assessment & Plan:   Principal Problem:   Acute Pulmonary Embolus/DVT Active Problems:   Acute encephalopathy   Dementia (HCC)   HTN (hypertension)   DM (diabetes mellitus), type 2 ([place])   COVID-19 virus infection   Severe sepsis (HCC)   AMS (altered mental status)   Decubitus ulcer of sacral region, unstageable ([place])   Sacral osteomyelitis ([place])   Cellulitis of sacral region   Left leg DVT ([place])   1)Severe Sepsis secondary to cellulitis/infected sacral decubitus ulcer. -X-rays without evidence of osteomyelitis -Procalcitonin was elevated - PICC line- 07/12/21 -She completed Vancomycin x 5 days  - Completed Cipro for total of 7 days ---last dose 07/16/21 -Leukocytosis persist due to steroids otherwise sepsis pathophysiology appears to be resolving-  2)Acute Hypoxic Respiratory failure secondary to COVID-19 infection/Pneumonia---   -Completed IV remdesivir -Change IV Solu-Medrol to 40 mg every 12 hours,-plan to discontinue Solu-Medrol upon discharge home ----Attempt to maintain euvolemic state --Zinc and vitamin C as ordered -Albuterol inhaler as needed -Accu-Cheks/fingersticks while on high-dose steroids - I-S/flutter valve, bronchodilators, vitamins -Hypoxia has resolved  3)Right large pulmonary embolism with cor pulmonale, submassive PE Bilateral lower  extremity DVT -- Lower extremity Dopplers-positive for bilateral DVT - Echocardiogram - ef 65%  - Given extensive clot burden we continued IV heparin for up to 5 days prior to switching to p.o. Eliquis on 07/16/21 -Plan to discharge on Eliquis for 6 months -Hypoxia has resolved  4) Unstageable sacral decubitus ulcer Pressure Injury 07/10/21 Coccyx Medial Unstageable - Full thickness tissue loss in which the base of the injury is covered by slough (yellow, tan, gray, green or brown) and/or eschar (tan, brown or black) in the wound bed. (Active)  07/10/21 0000  Location: Coccyx  Location Orientation: Medial  Staging: Unstageable - Full thickness tissue loss in which the base of the injury is covered by slough (yellow, tan, gray, green or brown) and/or eschar (tan, brown or black) in the wound bed.  Wound Description (Comments):   Present on Admission: Yes  -Antibiotics as above in #1  5)Acute metabolic encephalopathy-superimposed on underlying dementia--most likely due to #1 #2 #3 above -- At baseline can feed herself and occasionally recognize family members but unable to answer all questions.   -Currently unable to do these things---patient is currently Total care - CT head negative -c/n Namenda and Aricept  6)Essential hypertension -Blood pressure stable without PTA BP meds -Continue to hold amlodipine and HCTZ  8)Diabetes mellitus type 2 -A1c 5.2 reflecting excellent diabetic control PTA -Current hyperglycemia most likely due to steroids for COVID infection -Sliding scale insulin adjusted downward  9)Social/ethics--palliative care consult appreciated patient is a DNR  10)FTT- Nutritional status Nutrition Problem: Increased nutrient needs Etiology: wound healing Signs/Symptoms: estimated needs Interventions: Ensure Enlive (each supplement provides 350kcal and 20 grams of protein), MVI, Magic cup Body mass index is 19.45 kg/m.  11)H/o subdural hematoma 2015----monitor  closely while on anticoagulation  12) acute on chronic symptomatic anemia--- hemoglobin is stable currently- above 9 after transfusion of PRBC on 07/14/2021  watch closely especially while on anticoagulation,   DVT prophylaxis: Heparin drip Code Status: DNR Family Communication:  her niece  [name] is Primary Contact-  Discussed with [facility]CPOA on 9/11/20222 ([phone])  Dispo: The patient is from:  [facility] assisted living              Anticipated d/c is to: Group home---[facility]               Patient currently is medically stable to d/c.  Anticipate possible discharge back to family care home on 07/17/2021   difficult to place patient No   Subjective: - Patient requires total care, no fevers -Oral intake is fair as long as you feed her  Examination:  Physical Exam  Gen:- Awake Alert, pleasantly confused HEENT:- [place].AT, No sclera icterus Neck-Supple Neck,No JVD,.  Lungs-fair air movement bilaterally, no wheezing  CV- S1, S2 normal Abd-  +ve B.Sounds, Abd Soft, No tenderness,    Extremity/Skin:- No  edema,  , bilateral heel pressure injury, sacral decub Psych-baseline cognitive and memory deficits, poor insight and judgment Neuro-generalized weakness, no new focal deficits, no tremors   Objective: Vitals:   07/16/21 1100 07/16/21 1143 07/16/21 1200 07/16/21 1300  BP: (!) 136/48  (!) 145/40 (!) 113/48  Pulse:   60 60  Resp: '16  18 14  '$ Temp:  (!) 96.4 F (35.8 C)    TempSrc:  Axillary    SpO2:   100% 100%  Weight:      Height:        Intake/Output Summary (Last 24 hours) at 07/16/2021 1403 Last data filed at 07/16/2021 1156 Gross per 24 hour  Intake 858.86 ml  Output 1350 ml  Net -491.14 ml   Filed Weights   07/11/21 0500 07/13/21 0248 07/14/21 0527  Weight: 43.4 kg 45.6 kg 46.7 kg     Data Reviewed:   CBC: Recent Labs  Lab 07/11/21 0442 07/12/21 0500 07/12/21 0906 07/13/21 0300 07/14/21 0129 07/14/21 0827 07/15/21 0317 07/16/21 0410   WBC 19.5* 24.2*  --  24.5* 23.0* 26.7* 30.5* 35.2*  NEUTROABS 17.5* 21.9*  --  22.3* 20.9*  --  27.0*  --   HGB 10.0* 8.7*  --  8.2* 7.0* 7.6* 9.7* 10.6*  HCT 32.3* 26.6*   < > 25.4* 22.3* 23.7* 29.2* 32.3*  MCV 91.0 86.9  --  87.3 88.1 88.1 84.6 85.7  PLT 242 299  --  302 272 295 299 297   < > = values in this interval not displayed.   Basic Metabolic Panel: Recent Labs  Lab 07/11/21 0442 07/12/21 0500 07/12/21 1715 07/13/21 0300 07/13/21 0919 07/13/21 1612 07/14/21 0129 07/15/21 0317 07/16/21 0410  NA 142 139   < > 139 139 142 135 139 140  K 3.3* 2.7*   < > 2.8* 3.8 3.2* 4.0 2.9* 3.4*  CL 107 107   < > 115* 109 110 107 106 105  CO2 25 26   < > 21* '23 24 23 26 27  '$ GLUCOSE 83 137*   < > 197* 135* 132* 283* 162* 196*  BUN 17 15   < > '14 15 13 11 16 18  '$ CREATININE 0.61 0.55   < > 0.48 0.57 0.58 0.52 0.57 0.56  CALCIUM 8.4* 7.7*   < > 6.4* 7.8* 8.8* 7.0* 7.8* 7.9*  MG  2.3 2.1  --  1.7  --   --  2.1 2.0  --   PHOS 2.6 2.3*  --  2.3*  --   --  4.4 2.5  --    < > = values in this interval not displayed.   GFR: Estimated Creatinine Clearance: 38.6 mL/min (by C-G formula based on SCr of 0.56 mg/dL). Liver Function Tests: Recent Labs  Lab 07/12/21 0500 07/13/21 0300 07/13/21 0919 07/14/21 0129 07/15/21 0317  AST '15 19 22 16 15  '$ ALT '15 16 19 12 15  '$ ALKPHOS 56 54 64 43 50  BILITOT 0.4 0.2* 0.3 0.2* 0.8  PROT 5.2* 4.7* 5.5* 6.0* 4.8*  ALBUMIN 1.9* 1.7* 2.0* 3.9 2.5*   No results for input(s): LIPASE, AMYLASE in the last 168 hours. No results for input(s): AMMONIA in the last 168 hours. Coagulation Profile: No results for input(s): INR, PROTIME in the last 168 hours. Cardiac Enzymes: No results for input(s): CKTOTAL, CKMB, CKMBINDEX, TROPONINI in the last 168 hours. BNP (last 3 results) No results for input(s): PROBNP in the last 8760 hours. HbA1C: No results for input(s): HGBA1C in the last 72 hours. CBG: Recent Labs  Lab 07/15/21 1145 07/15/21 1713 07/15/21 2027  07/16/21 0920 07/16/21 1141  GLUCAP 268* 198* 166* 191* 192*   Lipid Profile: No results for input(s): CHOL, HDL, LDLCALC, TRIG, CHOLHDL, LDLDIRECT in the last 72 hours. Thyroid Function Tests: No results for input(s): TSH, T4TOTAL, FREET4, T3FREE, THYROIDAB in the last 72 hours.  Anemia Panel: Recent Labs    07/14/21 0129 07/15/21 0317  VITAMINB12  --  769  FOLATE  --  5.4*  FERRITIN 461* 681*  TIBC  --  95*  IRON  --  29   Sepsis Labs: Recent Labs  Lab 07/09/21 1801 07/10/21 0401 07/10/21 0903 07/10/21 1503 07/11/21 0442 07/11/21 2029 07/11/21 2338 07/12/21 0239 07/12/21 0500  PROCALCITON 1.17 0.85  --   --  1.02  --   --   --  0.56  LATICACIDVEN  --   --    < > 3.0*  --  1.2 1.3 1.8  --    < > = values in this interval not displayed.    Recent Results (from the past 240 hour(s))  Resp Panel by RT-PCR (Flu A&B, Covid) Nasopharyngeal Swab     Status: Abnormal   Collection Time: 07/09/21  5:41 PM   Specimen: Nasopharyngeal Swab; Nasopharyngeal(NP) swabs in vial transport medium  Result Value Ref Range Status   SARS Coronavirus 2 by RT PCR POSITIVE (A) NEGATIVE Final    Comment: CRITICAL RESULT CALLED TO, READ BACK BY AND VERIFIED WITH: [place] 07/09/2021 COLEMAN,R (NOTE) SARS-CoV-2 target nucleic acids are DETECTED.  The SARS-CoV-2 RNA is generally detectable in upper respiratory specimens during the acute phase of infection. Positive results are indicative of the presence of the identified virus, but do not rule out bacterial infection or co-infection with other pathogens not detected by the test. Clinical correlation with patient history and other diagnostic information is necessary to determine patient infection status. The expected result is Negative.  Fact Sheet for Patients: EntrepreneurPulse.com.au  Fact Sheet for Healthcare Providers: IncredibleEmployment.be  This test is not yet approved or cleared by the  [place] FDA and  has been authorized for detection and/or diagnosis of SARS-CoV-2 by FDA under an Emergency Use Authorization (EUA).  This EUA will remain in effect (meaning this t est can be used) for the duration of  the COVID-19  declaration under Section 564(b)(1) of the Act, 21 U.S.C. section 360bbb-3(b)(1), unless the authorization is terminated or revoked sooner.     Influenza A by PCR NEGATIVE NEGATIVE Final   Influenza B by PCR NEGATIVE NEGATIVE Final    Comment: (NOTE) The Xpert Xpress SARS-CoV-2/FLU/RSV plus assay is intended as an aid in the diagnosis of influenza from Nasopharyngeal swab specimens and should not be used as a sole basis for treatment. Nasal washings and aspirates are unacceptable for Xpert Xpress SARS-CoV-2/FLU/RSV testing.  Fact Sheet for Patients: EntrepreneurPulse.com.au  Fact Sheet for Healthcare Providers: IncredibleEmployment.be  This test is not yet approved or cleared by the [place] FDA and has been authorized for detection and/or diagnosis of SARS-CoV-2 by FDA under an Emergency Use Authorization (EUA). This EUA will remain in effect (meaning this test can be used) for the duration of the COVID-19 declaration under Section 564(b)(1) of the Act, 21 U.S.C. section 360bbb-3(b)(1), unless the authorization is terminated or revoked.  Performed at [facility], [address], [place], [place] [address]   Culture, blood (routine x 2)     Status: None   Collection Time: 07/09/21  5:50 PM   Specimen: Right Antecubital; Blood  Result Value Ref Range Status   Specimen Description RIGHT ANTECUBITAL  Final   Special Requests   Final    BOTTLES DRAWN AEROBIC AND ANAEROBIC Blood Culture adequate volume   Culture   Final    NO GROWTH 5 DAYS Performed at [facility], [address], [place], [place] [address]    Report Status 07/14/2021 FINAL  Final  Culture, blood (routine x 2)     Status: None    Collection Time: 07/09/21  6:14 PM   Specimen: BLOOD RIGHT ARM  Result Value Ref Range Status   Specimen Description BLOOD RIGHT ARM  Final   Special Requests   Final    BOTTLES DRAWN AEROBIC ONLY Blood Culture adequate volume   Culture   Final    NO GROWTH 5 DAYS Performed at [facility], [address], [place], [place] [address]    Report Status 07/14/2021 FINAL  Final  MRSA Next Gen by PCR, Nasal     Status: None   Collection Time: 07/09/21 10:44 PM   Specimen: Nasal Mucosa; Nasal Swab  Result Value Ref Range Status   MRSA by PCR Next Gen NOT DETECTED NOT DETECTED Final    Comment: (NOTE) The GeneXpert MRSA Assay (FDA approved for NASAL specimens only), is one component of a comprehensive MRSA colonization surveillance program. It is not intended to diagnose MRSA infection nor to guide or monitor treatment for MRSA infections. Test performance is not FDA approved in patients less than 70 years old. Performed at [facility], [address], [place],  [address]       Radiology Studies: No results found.  Scheduled Meds:  vitamin C  500 mg Oral BID   Chlorhexidine Gluconate Cloth  6 each Topical Daily   collagenase   Topical Daily   donepezil  5 mg Oral QHS   feeding supplement  1 Container Oral TID BM   folic acid  1 mg Oral Daily   insulin aspart  0-5 Units Subcutaneous QHS   insulin aspart  0-6 Units Subcutaneous TID WC   memantine  5 mg Oral BID   methylPREDNISolone (SOLU-MEDROL) injection  40 mg Intravenous Q12H   multivitamin-lutein  1 capsule Oral Daily   sodium chloride flush  10-40 mL Intracatheter Q12H   zinc  sulfate  220 mg Oral Daily   Continuous Infusions:  ciprofloxacin 400 mg (07/16/21 0649)   heparin 850 Units/hr (07/16/21 0911)     LOS: 6 days   Roxan Hockey, MD [facility]  If 7PM-7AM, please contact night-coverage  07/16/2021, 2:03 PM

## 2021-07-16 NOTE — TOC Progression Note (Signed)
Transition of Care [facility]) - Progression Note    Patient Details  Name: DANYAL MONCLOVA MRN: [id number] Date of Birth: 1934/10/04  [facility] CM/SW Contact  Natasha Bence, LCSW Phone Number: 07/16/2021, 3:09 PM  Clinical Narrative:    CSW contacted [facility] at [phone] to inquire about discharge planning. [facility] reported that patient could return to facility if she is able to ambulate and feed her self. [facility] reported that she will send Mr. Doristine Church to assess patient in the morning. TOC to follow.   Expected Discharge Plan: Home/Self Care Barriers to Discharge: Continued Medical Work up  Expected Discharge Plan and Services Expected Discharge Plan: Home/Self Care                                               Social Determinants of Health (SDOH) Interventions    Readmission Risk Interventions No flowsheet data found.

## 2021-07-16 NOTE — Progress Notes (Signed)
[place] for IV Heparin >> Eliquis  Indication: pulmonary embolus  Allergies  Allergen Reactions   Milk-Related Compounds Other (See Comments)    Lactose Intolerant    Iohexol Other (See Comments)     Desc: PT STATES THAT THE CONTRAST THAT SHE HAD IN 2004 MADE HER SICK, PT COULD NOT SPECIFY REACTION TYPES     Patient Measurements: Height: '5\' 1"'$  (154.9 cm) Weight: 46.7 kg (102 lb 15.3 oz) IBW/kg (Calculated) : 47.8 HEPARIN DW (KG): 42.6   Vital Signs: Temp: 96.4 F (35.8 C) (09/11 1143) Temp Source: Axillary (09/11 1143) BP: 113/48 (09/11 1300) Pulse Rate: 60 (09/11 1300)  Labs: Recent Labs    07/14/21 0129 07/14/21 0130 07/14/21 0827 07/15/21 0317 07/16/21 0410 07/16/21 0411  HGB 7.0*  --  7.6* 9.7* 10.6*  --   HCT 22.3*  --  23.7* 29.2* 32.3*  --   PLT 272  --  295 299 297  --   HEPARINUNFRC  --    < > 0.58 0.64  --  1.07*  CREATININE 0.52  --   --  0.57 0.56  --    < > = values in this interval not displayed.    Estimated Creatinine Clearance: 38.6 mL/min (by C-G formula based on SCr of 0.56 mg/dL).   Medical History: Past Medical History:  Diagnosis Date   Adenomatous colon polyp    carcinoma in situ   AMS (altered mental status) 07/09/2021   Chronic abdominal pain    Dementia (HCC)    Diabetes mellitus    Fatty liver    on [place] normal LFT'S   GERD (gastroesophageal reflux disease)    Hyperlipidemia    Hypertension    Iron deficiency    Chronic current parameter normal; 4/09 iron was 12 sat 5%   SDH (subdural hematoma) ([place]) 01/2014   chronic    Assessment: 87 yr old woman was admitted with acute encephalopathy, sepsis, COVID-19 infection, sacral decubitus ulcer/cellulitis. MD concerned for PE. CT angiogram today showed large R-rided PE with evidence of R heart strain, consistent with at least submassive PE. CT angiogram. Dopplers showed age-indeterrminate thrombus in LLE (suggestive for chronic DVT, rather than  acute DVT). D-dimer >20.0.   Heparin level has trended up to supratherapeutic level. CBC OK. No bleeding issues noted. Need to continue to monitor hemoglobin, monitor CBC daily.  Goal of Therapy:  Heparin level 0.3-0.7 units/ml Monitor platelets by anticoagulation protocol: Yes   Plan:  Stop Heparin tonight prior to starting Eliquis Eliquis '10mg'$  po BID X 7 DAYS THEN '5mg'$  bid F/u on 9/12 for patient education and voucher Monitor for bleeding  Hart Robinsons, PharmD Clinical Pharmacist 07/16/2021 2:22 PM

## 2021-07-16 NOTE — Progress Notes (Signed)
[place] for IV Heparin Indication: pulmonary embolus  Allergies  Allergen Reactions   Milk-Related Compounds Other (See Comments)    Lactose Intolerant    Iohexol Other (See Comments)     Desc: PT STATES THAT THE CONTRAST THAT SHE HAD IN 2004 MADE HER SICK, PT COULD NOT SPECIFY REACTION TYPES     Patient Measurements: Height: '5\' 1"'$  (154.9 cm) Weight: 46.7 kg (102 lb 15.3 oz) IBW/kg (Calculated) : 47.8 HEPARIN DW (KG): 42.6   Vital Signs: Temp: 97.9 F (36.6 C) (09/11 0600) Temp Source: Axillary (09/11 0600) BP: 133/55 (09/11 0700) Pulse Rate: 55 (09/11 0600)  Labs: Recent Labs    07/13/21 0919 07/13/21 1612 07/14/21 0129 07/14/21 0130 07/14/21 0827 07/15/21 0317 07/16/21 0410 07/16/21 0411  HGB  --    < > 7.0*  --  7.6* 9.7* 10.6*  --   HCT  --    < > 22.3*  --  23.7* 29.2* 32.3*  --   PLT  --    < > 272  --  295 299 297  --   APTT 95*  --   --   --   --   --   --   --   HEPARINUNFRC 0.56   < >  --    < > 0.58 0.64  --  1.07*  CREATININE 0.57   < > 0.52  --   --  0.57 0.56  --    < > = values in this interval not displayed.    Estimated Creatinine Clearance: 38.6 mL/min (by C-G formula based on SCr of 0.56 mg/dL).   Medical History: Past Medical History:  Diagnosis Date   Adenomatous colon polyp    carcinoma in situ   AMS (altered mental status) 07/09/2021   Chronic abdominal pain    Dementia (HCC)    Diabetes mellitus    Fatty liver    on [place] normal LFT'S   GERD (gastroesophageal reflux disease)    Hyperlipidemia    Hypertension    Iron deficiency    Chronic current parameter normal; 4/09 iron was 12 sat 5%   SDH (subdural hematoma) ([place]) 01/2014   chronic    Assessment: 87 yr old woman was admitted with acute encephalopathy, sepsis, COVID-19 infection, sacral decubitus ulcer/cellulitis. MD concerned for PE. CT angiogram today showed large R-rided PE with evidence of R heart strain, consistent with at least  submassive PE. CT angiogram. Dopplers showed age-indeterrminate thrombus in LLE (suggestive for chronic DVT, rather than acute DVT). D-dimer >20.0.   Heparin level has trended up to supratherapeutic level. CBC OK. No bleeding issues noted. Need to continue to monitor hemoglobin, monitor CBC daily.  Goal of Therapy:  Heparin level 0.3-0.7 units/ml Monitor platelets by anticoagulation protocol: Yes   Plan:  Decrease heparin infusion to 850 units/hr Recheck HL in 8 hours Monitor daily heparin level and CBC Monitor for bleeding F/U transition to oral anticoagulant when able  Hart Robinsons, PharmD Clinical Pharmacist 07/16/2021 8:50 AM

## 2021-07-17 LAB — CBC
HCT: 30.3 % — ABNORMAL LOW (ref 36.0–46.0)
Hemoglobin: 9.7 g/dL — ABNORMAL LOW (ref 12.0–15.0)
MCH: 27.8 pg (ref 26.0–34.0)
MCHC: 32 g/dL (ref 30.0–36.0)
MCV: 86.8 fL (ref 80.0–100.0)
Platelets: 265 10*3/uL (ref 150–400)
RBC: 3.49 MIL/uL — ABNORMAL LOW (ref 3.87–5.11)
RDW: 16 % — ABNORMAL HIGH (ref 11.5–15.5)
WBC: 30 10*3/uL — ABNORMAL HIGH (ref 4.0–10.5)
nRBC: 0.2 % (ref 0.0–0.2)

## 2021-07-17 LAB — BASIC METABOLIC PANEL
Anion gap: 4 — ABNORMAL LOW (ref 5–15)
BUN: 19 mg/dL (ref 8–23)
CO2: 27 mmol/L (ref 22–32)
Calcium: 7.9 mg/dL — ABNORMAL LOW (ref 8.9–10.3)
Chloride: 107 mmol/L (ref 98–111)
Creatinine, Ser: 0.59 mg/dL (ref 0.44–1.00)
GFR, Estimated: >60 mL/min (ref >60)
Glucose, Bld: 149 mg/dL — ABNORMAL HIGH (ref 70–99)
Potassium: 3.6 mmol/L (ref 3.5–5.1)
Sodium: 138 mmol/L (ref 135–145)

## 2021-07-17 LAB — GLUCOSE, CAPILLARY
Glucose-Capillary: 196 mg/dL — ABNORMAL HIGH (ref 70–99)
Glucose-Capillary: 230 mg/dL — ABNORMAL HIGH (ref 70–99)
Glucose-Capillary: 129 mg/dL — ABNORMAL HIGH (ref 70–99)
Glucose-Capillary: 154 mg/dL — ABNORMAL HIGH (ref 70–99)

## 2021-07-17 LAB — LACTATE DEHYDROGENASE: LDH: 175 U/L (ref 98–192)

## 2021-07-17 MED ORDER — ENSURE ENLIVE PO LIQD
237.0000 mL | Freq: Three times a day (TID) | ORAL | Status: DC
  Administered 2021-07-17 – 2021-07-20 (×9): 237 mL via ORAL

## 2021-07-17 MED ORDER — PREDNISONE 20 MG PO TABS
50.0000 mg | ORAL_TABLET | Freq: Every day | ORAL | Status: DC
  Administered 2021-07-18 – 2021-07-19 (×2): 50 mg via ORAL
  Filled 2021-07-17 (×3): qty 1

## 2021-07-17 NOTE — Care Management Important Message (Signed)
Important Message  Patient Details  Name: Taylor Andrews MRN: [id number] Date of Birth: Aug 02, 1934   Medicare Important Message Given:  Yes - Important Message mailed due to current National Emergency     Tommy Medal 07/17/2021, 3:52 PM

## 2021-07-17 NOTE — Progress Notes (Signed)
PROGRESS NOTE    Taylor Andrews  [id number] DOB: 1934/02/06 DOA: 07/09/2021 PCP: Redmond School, MD   Brief Narrative:  87 y.o. BF PMHx dementia,, hypertension, DM type II controlled, fatty liver disease,  subdural hematoma 2015 came from nursing home for altered mental status and cough.  Admitted 3 weeks ago for dehydration with possible sepsis initially on vancomycin and cefepime, MRI brain was unremarkable, cultures were negative and was discharged on antibiotics.  Admitted to the hospital for sepsis secondary to [id number] infection complicated by pulmonary embolism and Bil DVT. She was started on Heparin drip.    Assessment & Plan:   Principal Problem:   Acute Pulmonary Embolus/DVT Active Problems:   Acute encephalopathy   COVID-19 virus infection   Left leg DVT (HCC)   HTN (hypertension)   DM (diabetes mellitus), type 2 (HCC)   Dementia (HCC)   Severe sepsis (HCC)   AMS (altered mental status)   Decubitus ulcer of sacral region, unstageable ([place])   Sacral osteomyelitis ([place])   Cellulitis of sacral region  1)Severe Sepsis Secondary to cellulitis/infected sacral decubitus ulcer. -X-rays without evidence of osteomyelitis -Procalcitonin was elevated - PICC line- 07/12/21 -She completed Vancomycin x 5 days  - Will Complete Cipro for total of 9 days ---last dose 07/18/21 -Leukocytosis persist due to steroids otherwise sepsis pathophysiology appears to be resolving-  2)Acute Hypoxic Respiratory failure secondary to COVID-19 infection/Pneumonia---   -Completed IV remdesivir -Treated  IV Solu-Medrol to 40 mg every 12 hours -Change to Prednisone on 07/18/2022 ----Attempt to maintain euvolemic state --Zinc and vitamin C as ordered -Albuterol inhaler as needed -Accu-Cheks/fingersticks while on high-dose steroids - I-S/flutter valve, bronchodilators, vitamins -Hypoxia has resolved  3)Right large pulmonary embolism with cor pulmonale, submassive PE Bilateral lower extremity  DVT -- Lower extremity Dopplers-positive for bilateral DVT - Echocardiogram - ef 65%  - Given extensive clot burden we continued IV heparin for up to 5 days prior to switching to p.o. Eliquis on 07/16/21 -Plan to discharge on Eliquis for 6 months -Hypoxia has resolved  4) Unstageable sacral decubitus ulcer Pressure Injury 07/10/21 Coccyx Medial Unstageable - Full thickness tissue loss in which the base of the injury is covered by slough (yellow, tan, gray, green or brown) and/or eschar (tan, brown or black) in the wound bed. (Active)  07/10/21 0000  Location: Coccyx  Location Orientation: Medial  Staging: Unstageable - Full thickness tissue loss in which the base of the injury is covered by slough (yellow, tan, gray, green or brown) and/or eschar (tan, brown or black) in the wound bed.  Wound Description (Comments):   Present on Admission: Yes  -Antibiotics as above in #1  5)Acute metabolic encephalopathy-superimposed on underlying dementia--most likely due to #1 #2 #3 above -- At baseline can feed herself and occasionally recognize family members but unable to answer all questions.   -Currently unable to do these things---patient is currently Total care with ADLs - CT head negative -c/n Namenda and Aricept  6)Essential hypertension -Blood pressure stable without PTA BP meds -Continue to hold amlodipine and HCTZ  8)Diabetes mellitus type 2 -A1c 5.2 reflecting excellent diabetic control PTA -Current hyperglycemia most likely due to steroids for COVID infection -Sliding scale insulin adjusted downward  9)Social/ethics--palliative care consult appreciated patient is a DNR  10)FTT- Nutritional status Nutrition Problem: Increased nutrient needs Etiology: wound healing Signs/Symptoms: estimated needs Interventions: Ensure Enlive (each supplement provides 350kcal and 20 grams of protein), MVI, Magic cup Body mass index is 19.45 kg/m.  11)H/o subdural  hematoma 2015----monitor  closely while on anticoagulation  12) acute on chronic symptomatic anemia--- hemoglobin is stable currently- above 9 after transfusion of PRBC on 07/14/2021  watch closely especially while on anticoagulation,   DVT prophylaxis: Heparin drip Code Status: DNR Family Communication:  her niece  [name] is Primary Contact-  Discussed with HCPOA on 9/11/20222 ([phone])  Dispo: The patient is from:  [place] assisted living              Anticipated d/c is to: Group home---[facility]               Patient currently is medically stable to d/c.  Anticipate possible discharge back to family care home on 07/18/2021   difficult to place patient No   Subjective: - -no fevers  No Nausea, Vomiting or Diarrhea   Examination:  Physical Exam  Gen:- Awake Alert, pleasantly confused HEENT:- [place].AT, No sclera icterus Neck-Supple Neck,No JVD,.  Lungs-fair air movement bilaterally, no wheezing  CV- S1, S2 normal Abd-  +ve B.Sounds, Abd Soft, No tenderness,    Extremity/Skin:- No  edema,  , bilateral heel pressure injury, sacral decub Psych-baseline cognitive and memory deficits, poor insight and judgment Neuro-generalized weakness, no new focal deficits, no tremors   Objective: Vitals:   07/17/21 0900 07/17/21 1000 07/17/21 1100 07/17/21 1200  BP: (!) 122/37 (!) 129/40 (!) 131/41 (!) 129/38  Pulse:      Resp: '14 14 18 16  '$ Temp:   98.6 F (37 C)   TempSrc:   Axillary   SpO2:      Weight:      Height:        Intake/Output Summary (Last 24 hours) at 07/17/2021 1604 Last data filed at 07/17/2021 1446 Gross per 24 hour  Intake 1366.2 ml  Output 1150 ml  Net 216.2 ml   Filed Weights   07/11/21 0500 07/13/21 0248 07/14/21 0527  Weight: 43.4 kg 45.6 kg 46.7 kg     Data Reviewed:   CBC: Recent Labs  Lab 07/11/21 0442 07/12/21 0500 07/12/21 0906 07/13/21 0300 07/14/21 0129 07/14/21 0827 07/15/21 0317 07/16/21 0410 07/17/21 0435  WBC 19.5* 24.2*  --  24.5*  23.0* 26.7* 30.5* 35.2* 30.0*  NEUTROABS 17.5* 21.9*  --  22.3* 20.9*  --  27.0*  --   --   HGB 10.0* 8.7*  --  8.2* 7.0* 7.6* 9.7* 10.6* 9.7*  HCT 32.3* 26.6*   < > 25.4* 22.3* 23.7* 29.2* 32.3* 30.3*  MCV 91.0 86.9  --  87.3 88.1 88.1 84.6 85.7 86.8  PLT 242 299  --  302 272 295 299 297 265   < > = values in this interval not displayed.   Basic Metabolic Panel: Recent Labs  Lab 07/11/21 0442 07/12/21 0500 07/12/21 1715 07/13/21 0300 07/13/21 0919 07/13/21 1612 07/14/21 0129 07/15/21 0317 07/16/21 0410 07/17/21 0435  NA 142 139   < > 139   < > 142 135 139 140 138  K 3.3* 2.7*   < > 2.8*   < > 3.2* 4.0 2.9* 3.4* 3.6  CL 107 107   < > 115*   < > 110 107 106 105 107  CO2 25 26   < > 21*   < > '24 23 26 27 27  '$ GLUCOSE 83 137*   < > 197*   < > 132* 283* 162* 196* 149*  BUN 17 15   < > 14   < > 13 11 16  18 19  CREATININE 0.61 0.55   < > 0.48   < > 0.58 0.52 0.57 0.56 0.59  CALCIUM 8.4* 7.7*   < > 6.4*   < > 8.8* 7.0* 7.8* 7.9* 7.9*  MG 2.3 2.1  --  1.7  --   --  2.1 2.0  --   --   PHOS 2.6 2.3*  --  2.3*  --   --  4.4 2.5  --   --    < > = values in this interval not displayed.   GFR: Estimated Creatinine Clearance: 38.6 mL/min (by C-G formula based on SCr of 0.59 mg/dL). Liver Function Tests: Recent Labs  Lab 07/12/21 0500 07/13/21 0300 07/13/21 0919 07/14/21 0129 07/15/21 0317  AST '15 19 22 16 15  '$ ALT '15 16 19 12 15  '$ ALKPHOS 56 54 64 43 50  BILITOT 0.4 0.2* 0.3 0.2* 0.8  PROT 5.2* 4.7* 5.5* 6.0* 4.8*  ALBUMIN 1.9* 1.7* 2.0* 3.9 2.5*   No results for input(s): LIPASE, AMYLASE in the last 168 hours. No results for input(s): AMMONIA in the last 168 hours. Coagulation Profile: No results for input(s): INR, PROTIME in the last 168 hours. Cardiac Enzymes: No results for input(s): CKTOTAL, CKMB, CKMBINDEX, TROPONINI in the last 168 hours. BNP (last 3 results) No results for input(s): PROBNP in the last 8760 hours. HbA1C: No results for input(s): HGBA1C in the last 72  hours. CBG: Recent Labs  Lab 07/16/21 1141 07/16/21 1632 07/16/21 2146 07/17/21 0747 07/17/21 1155  GLUCAP 192* 171* 276* 196* 230*   Lipid Profile: No results for input(s): CHOL, HDL, LDLCALC, TRIG, CHOLHDL, LDLDIRECT in the last 72 hours. Thyroid Function Tests: No results for input(s): TSH, T4TOTAL, FREET4, T3FREE, THYROIDAB in the last 72 hours.  Anemia Panel: Recent Labs    07/15/21 0317  VITAMINB12 769  FOLATE 5.4*  FERRITIN 681*  TIBC 95*  IRON 29   Sepsis Labs: Recent Labs  Lab 07/11/21 0442 07/11/21 2029 07/11/21 2338 07/12/21 0239 07/12/21 0500  PROCALCITON 1.02  --   --   --  0.56  LATICACIDVEN  --  1.2 1.3 1.8  --     Recent Results (from the past 240 hour(s))  Resp Panel by RT-PCR (Flu A&B, Covid) Nasopharyngeal Swab     Status: Abnormal   Collection Time: 07/09/21  5:41 PM   Specimen: Nasopharyngeal Swab; Nasopharyngeal(NP) swabs in vial transport medium  Result Value Ref Range Status   SARS Coronavirus 2 by RT PCR POSITIVE (A) NEGATIVE Final    Comment: CRITICAL RESULT CALLED TO, READ BACK BY AND VERIFIED WITH: [place] 07/09/2021 COLEMAN,R (NOTE) SARS-CoV-2 target nucleic acids are DETECTED.  The SARS-CoV-2 RNA is generally detectable in upper respiratory specimens during the acute phase of infection. Positive results are indicative of the presence of the identified virus, but do not rule out bacterial infection or co-infection with other pathogens not detected by the test. Clinical correlation with patient history and other diagnostic information is necessary to determine patient infection status. The expected result is Negative.  Fact Sheet for Patients: EntrepreneurPulse.com.au  Fact Sheet for Healthcare Providers: IncredibleEmployment.be  This test is not yet approved or cleared by the [place] FDA and  has been authorized for detection and/or diagnosis of SARS-CoV-2 by FDA under an  Emergency Use Authorization (EUA).  This EUA will remain in effect (meaning this t est can be used) for the duration of  the COVID-19 declaration under Section 564(b)(1) of the Act,  21 U.S.C. section 360bbb-3(b)(1), unless the authorization is terminated or revoked sooner.     Influenza A by PCR NEGATIVE NEGATIVE Final   Influenza B by PCR NEGATIVE NEGATIVE Final    Comment: (NOTE) The Xpert Xpress SARS-CoV-2/FLU/RSV plus assay is intended as an aid in the diagnosis of influenza from Nasopharyngeal swab specimens and should not be used as a sole basis for treatment. Nasal washings and aspirates are unacceptable for Xpert Xpress SARS-CoV-2/FLU/RSV testing.  Fact Sheet for Patients: EntrepreneurPulse.com.au  Fact Sheet for Healthcare Providers: IncredibleEmployment.be  This test is not yet approved or cleared by the [place] FDA and has been authorized for detection and/or diagnosis of SARS-CoV-2 by FDA under an Emergency Use Authorization (EUA). This EUA will remain in effect (meaning this test can be used) for the duration of the COVID-19 declaration under Section 564(b)(1) of the Act, 21 U.S.C. section 360bbb-3(b)(1), unless the authorization is terminated or revoked.  Performed at [facility], [address] [place], [place] [address]   Culture, blood (routine x 2)     Status: None   Collection Time: 07/09/21  5:50 PM   Specimen: Right Antecubital; Blood  Result Value Ref Range Status   Specimen Description RIGHT ANTECUBITAL  Final   Special Requests   Final    BOTTLES DRAWN AEROBIC AND ANAEROBIC Blood Culture adequate volume   Culture   Final    NO GROWTH 5 DAYS Performed at [facility], [address] [place], [place] [address]    Report Status 07/14/2021 FINAL  Final  Culture, blood (routine x 2)     Status: None   Collection Time: 07/09/21  6:14 PM   Specimen: BLOOD RIGHT ARM  Result Value Ref Range Status   Specimen  Description BLOOD RIGHT ARM  Final   Special Requests   Final    BOTTLES DRAWN AEROBIC ONLY Blood Culture adequate volume   Culture   Final    NO GROWTH 5 DAYS Performed at [facility], [address] [place], [place] [address]    Report Status 07/14/2021 FINAL  Final  MRSA Next Gen by PCR, Nasal     Status: None   Collection Time: 07/09/21 10:44 PM   Specimen: Nasal Mucosa; Nasal Swab  Result Value Ref Range Status   MRSA by PCR Next Gen NOT DETECTED NOT DETECTED Final    Comment: (NOTE) The GeneXpert MRSA Assay (FDA approved for NASAL specimens only), is one component of a comprehensive MRSA colonization surveillance program. It is not intended to diagnose MRSA infection nor to guide or monitor treatment for MRSA infections. Test performance is not FDA approved in patients less than 61 years old. Performed at [facility], [address] [place], [place] [address]       Radiology Studies: No results found.  Scheduled Meds:  apixaban  10 mg Oral BID   Followed by   Derrill Memo ON 07/23/2021] apixaban  5 mg Oral BID   vitamin C  500 mg Oral BID   Chlorhexidine Gluconate Cloth  6 each Topical Daily   collagenase   Topical Daily   donepezil  5 mg Oral QHS   feeding supplement  237 mL Oral TID BM   folic acid  1 mg Oral Daily   insulin aspart  0-5 Units Subcutaneous QHS   insulin aspart  0-6 Units Subcutaneous TID WC   memantine  5 mg Oral BID   methylPREDNISolone (SOLU-MEDROL) injection  40 mg Intravenous Q12H   multivitamin-lutein  1  capsule Oral Daily   [START ON 07/18/2021] predniSONE  50 mg Oral Q breakfast   sodium chloride flush  10-40 mL Intracatheter Q12H   zinc sulfate  220 mg Oral Daily   Continuous Infusions:  ciprofloxacin 400 mg (07/17/21 0616)    LOS: 7 days   Roxan Hockey, MD [facility]  If 7PM-7AM, please contact night-coverage  07/17/2021, 4:04 PM

## 2021-07-17 NOTE — Progress Notes (Signed)
Nutrition Follow-up  DOCUMENTATION CODES:  Not applicable  INTERVENTION:  Discontinue Boost Breeze TID.  Add Ensure Enlive po TID, each supplement provides 350 kcal and 20 grams of protein.  Continue Magic Cup TID.  Continue vitamin supplementation.  Continue with feeding assistance with meals.  NUTRITION DIAGNOSIS:  Increased nutrient needs related to wound healing as evidenced by estimated needs. - ongoing  GOAL:  Patient will meet greater than or equal to 90% of their needs - not meeting consistently  MONITOR:  PO intake, Supplement acceptance, Labs, Skin, Weight trends  REASON FOR ASSESSMENT:  Malnutrition Screening Tool    ASSESSMENT:  Patient is a 87 yo female from [facility]. Hx of DM, GERD, dementia, HTN, HLD. Hospitalized last month with altered mental status, sepsis. From [facility]. Presents with altered mental status.  PO intake over the last 8 meals averages ~40%, which is not sufficient enough to meet her needs.  Pt unable to provide much meaningful information. Pt will likely need long-term feeding assistance due to dementia and confusion.   Recommend discontinuing Boost Breeze and adding Ensure Enlive TID to promote intake. Continue Magic Cup TID.  Supplements: Boost Breeze TID  Medications: reviewed; Vitamin C BID, folic acid, SSI, Solu-Medrol, MVI-lutein, zinc sulfate  Labs: reviewed; CBG 171-276 (H)  Diet Order:   Diet Order             DIET - DYS 1 Room service appropriate? Yes; Fluid consistency: Thin  Diet effective now                  EDUCATION NEEDS:  Not appropriate for education at this time  Skin:  Skin Assessment: Skin Integrity Issues: Skin Integrity Issues:: Unstageable Unstageable: coccyx  Last BM:  PTA  Height:  Ht Readings from Last 1 Encounters:  07/09/21 '5\' 1"'$  (1.549 m)   Weight:  Wt Readings from Last 1 Encounters:  07/14/21 46.7 kg   BMI:  Body mass index is 19.45 kg/m.  Estimated  Nutritional Needs:  Kcal:  1500-1600 Protein:  70-77 gr Fluid:  >1200 ml daily  Derrel Nip, RD, LDN (she/her/hers) Registered Dietitian I After-Hours/Weekend Pager # in [place]

## 2021-07-18 LAB — GLUCOSE, CAPILLARY
Glucose-Capillary: 224 mg/dL — ABNORMAL HIGH (ref 70–99)
Glucose-Capillary: 201 mg/dL — ABNORMAL HIGH (ref 70–99)
Glucose-Capillary: 188 mg/dL — ABNORMAL HIGH (ref 70–99)
Glucose-Capillary: 240 mg/dL — ABNORMAL HIGH (ref 70–99)

## 2021-07-18 LAB — CBC
HCT: 33.3 % — ABNORMAL LOW (ref 36.0–46.0)
Hemoglobin: 10.7 g/dL — ABNORMAL LOW (ref 12.0–15.0)
MCH: 28.3 pg (ref 26.0–34.0)
MCHC: 32.1 g/dL (ref 30.0–36.0)
MCV: 88.1 fL (ref 80.0–100.0)
Platelets: 270 10*3/uL (ref 150–400)
RBC: 3.78 MIL/uL — ABNORMAL LOW (ref 3.87–5.11)
RDW: 17.1 % — ABNORMAL HIGH (ref 11.5–15.5)
WBC: 29 10*3/uL — ABNORMAL HIGH (ref 4.0–10.5)
nRBC: 0.1 % (ref 0.0–0.2)

## 2021-07-18 NOTE — TOC Initial Note (Addendum)
Transition of Care [facility]) - Initial/Assessment Note    Patient Details  Name: Taylor Andrews MRN: [id number] Date of Birth: 31-Aug-1934  Transition of Care [facility]) CM/SW Contact:    Boneta Lucks, RN Phone Number: 07/18/2021, 12:34 PM  Clinical Narrative:   Patient admitted with acute pulmonary embolus. Patient lives a [facility]. TOC spoke with Taylor Andrews from family care home. They have no issues with her and will take her back even if they need to feed her. However she has to be ambulatory. Per MD she is not ambulatory.  TOC spoke with Taylor Andrews- niece and POA, she is agreeable to long term bed. PASSR is pending screened out [id number]. TOC will upload documents requested.              PASSR# given   [id number]   Expected Discharge Plan: Long Term Acute Care (LTAC) Barriers to Discharge: Continued Medical Work up  Patient Goals and CMS Choice Patient states their goals for this hospitalization and ongoing recovery are:: agreeable to long term care. CMS Medicare.gov Compare Post Acute Care list provided to:: Patient Represenative (must comment) Choice offered to / list presented to : [facility] POA / Guardian  Expected Discharge Plan and Services Expected Discharge Plan: [place] (LTAC)    Living arrangements for the past 2 months: [place]                    Prior Living Arrangements/Services Living arrangements for the past 2 months: [place] Lives with:: Facility Resident Patient language and need for interpreter reviewed:: Yes        Need for Family Participation in Patient Care: Yes (Comment) Care giver support system in place?: Yes (comment)   Criminal Activity/Legal Involvement Pertinent to Current Situation/Hospitalization: No - Comment as needed  Activities of Daily Living Home Assistive Devices/Equipment: None ADL Screening (condition at time of admission) Patient's cognitive ability adequate to safely complete daily  activities?: No Is the patient deaf or have difficulty hearing?: No Does the patient have difficulty seeing, even when wearing glasses/contacts?: No Does the patient have difficulty concentrating, remembering, or making decisions?: Yes Patient able to express need for assistance with ADLs?: No Does the patient have difficulty dressing or bathing?: Yes Independently performs ADLs?: No Communication: Needs assistance Is this a change from baseline?: (P) Change from baseline, expected to last <3 days Dressing (OT): (P) Dependent Is this a change from baseline?: (P) Change from baseline, expected to last <3days Grooming: (P) Dependent Is this a change from baseline?: (P) Change from baseline, expected to last <3 days Does the patient have difficulty walking or climbing stairs?: Yes Weakness of Legs: Both Weakness of Arms/Hands: Both  Permission Sought/Granted      Emotional Assessment      Alcohol / Substance Use: Not Applicable Psych Involvement: No (comment)  Admission diagnosis:  Altered mental status, unspecified altered mental status type [R41.82] AMS (altered mental status) [R41.82] Sepsis with encephalopathy without septic shock, due to unspecified organism ([place]) [A41.9, R65.20, G93.40] Patient Active Problem List   Diagnosis Date Noted   Left leg DVT ([place]) 07/11/2021   Acute Pulmonary Embolus/DVT 07/11/2021   Severe sepsis ([place]) 07/10/2021   AMS (altered mental status) 07/10/2021   Decubitus ulcer of sacral region, unstageable ([place]) 07/10/2021   Sacral osteomyelitis ([place]) 07/10/2021   Cellulitis of sacral region 07/10/2021   COVID-19 virus infection 07/09/2021   Hypokalemia 07/14/2015   Encephalopathy acute    Aspiration  pneumonia ([place]) 07/13/2015   Acute encephalopathy 07/13/2015   Sepsis ([place]) 07/13/2015   Dementia ([place]) 07/13/2015   HTN (hypertension) 07/13/2015   DM (diabetes mellitus), type 2 ([place]) 07/13/2015   Encephalopathy 07/13/2015   Subdural hematoma  ([place]) 01/22/2014   Lower abdominal pain 12/30/2013   Hx of adenomatous colonic polyps 12/30/2013   Anemia 04/03/2013   IRON DEFICIENCY 06/02/2009   MILK PRODUCTS ALLERGY 06/02/2009   COLONIC POLYPS 05/26/2009   HEMORRHOIDS, INTERNAL 05/26/2009   ABDOMINAL PAIN 05/26/2009   PCP:  Redmond School, MD Pharmacy:   [facility], [place] [address] Phone: [phone] Fax: [phone]  [id number] - [place], [place] AT [place]. [place] [address] Phone: [phone] Fax: [phone]  [place] - [place], [place] - [place] [id number] [place] [address] [place] [address] Phone: [phone] Fax: [place], [place] [address] Phone: [phone] Fax: [phone]  [place], [place] [id number] [place] [id number] Phone: [phone] Fax: [phone]  [place], [place] [facility] [address] [place] [address] Phone: [phone] Fax: [phone]  Readmission Risk Interventions Readmission Risk Prevention Plan 07/18/2021  Transportation Screening Complete  HRI or Home Care Consult Complete  Social Work Consult for [place] Planning/Counseling Complete  Palliative Care Screening Not Applicable  Medication Review Press photographer) Complete  Some recent data might be hidden

## 2021-07-18 NOTE — [place] FL2 (Signed)
[place] MEDICAID FL2 LEVEL OF CARE SCREENING TOOL     IDENTIFICATION  Patient Name: Taylor Andrews Birthdate: Feb 12, 1934 Sex: female Admission Date (Current Location): 07/09/2021  County and [place] Number:  [employer] and Address:  [place] [address] [place]      Provider Number: [id number]  Attending Physician Name and Address:  Roxan Hockey, MD  Relative Name and Phone Number:  Pablo Ledger  [phone]    Current Level of Care: Hospital Recommended Level of Care: Nursing Facility Prior Approval Number:    Date Approved/Denied:   PASRR Number: Pending  Discharge Plan: Other (Comment) (LTC)    Current Diagnoses: Patient Active Problem List   Diagnosis Date Noted   Left leg DVT ([place]) 07/11/2021   Acute Pulmonary Embolus/DVT 07/11/2021   Severe sepsis ([place]) 07/10/2021   AMS (altered mental status) 07/10/2021   Decubitus ulcer of sacral region, unstageable ([place]) 07/10/2021   Sacral osteomyelitis ([place]) 07/10/2021   Cellulitis of sacral region 07/10/2021   COVID-19 virus infection 07/09/2021   Hypokalemia 07/14/2015   Encephalopathy acute    Aspiration pneumonia ([place]) 07/13/2015   Acute encephalopathy 07/13/2015   Sepsis ([place]) 07/13/2015   Dementia ([place]) 07/13/2015   HTN (hypertension) 07/13/2015   DM (diabetes mellitus), type 2 ([place]) 07/13/2015   Encephalopathy 07/13/2015   Subdural hematoma ([place]) 01/22/2014   Lower abdominal pain 12/30/2013   Hx of adenomatous colonic polyps 12/30/2013   Anemia 04/03/2013   IRON DEFICIENCY 06/02/2009   MILK PRODUCTS ALLERGY 06/02/2009   COLONIC POLYPS 05/26/2009   HEMORRHOIDS, INTERNAL 05/26/2009   ABDOMINAL PAIN 05/26/2009    Orientation RESPIRATION BLADDER Height & Weight        O2 (2 L) External catheter Weight: 46.7 kg Height:  '5\' 1"'$  (154.9 cm)  BEHAVIORAL SYMPTOMS/MOOD NEUROLOGICAL BOWEL NUTRITION STATUS      Continent    AMBULATORY STATUS  COMMUNICATION OF NEEDS Skin   Extensive Assist   Bruising (Generalized)                       Personal Care Assistance Level of Assistance  Bathing, Feeding, Dressing, Total care Bathing Assistance: Maximum assistance Feeding assistance: Maximum assistance Dressing Assistance: Maximum assistance Total Care Assistance: Maximum assistance   Functional Limitations Info  Sight, Hearing, Speech Sight Info: Adequate Hearing Info: Adequate Speech Info: Adequate    SPECIAL CARE FACTORS FREQUENCY                       Contractures Contractures Info: Not present    Additional Factors Info  Code Status, Allergies, Psychotropic Code Status Info: DNR Allergies Info: Milk related compounds, lohexol Psychotropic Info: Ativan         Current Medications (07/18/2021):  This is the current hospital active medication list Current Facility-Administered Medications  Medication Dose Route Frequency Provider Last Rate Last Admin   acetaminophen (TYLENOL) tablet 650 mg  650 mg Oral Q6H PRN Amin, Ankit Chirag, MD       albuterol (VENTOLIN HFA) 108 (90 Base) MCG/ACT inhaler 2 puff  2 puff Inhalation Q4H PRN Amin, Ankit Chirag, MD       apixaban (ELIQUIS) tablet 10 mg  10 mg Oral BID Hart Robinsons A, RPH   10 mg at 07/18/21 0920   Followed by   Derrill Memo ON 07/23/2021] apixaban (ELIQUIS) tablet 5 mg  5 mg Oral BID Ena Dawley, RPH  ascorbic acid (VITAMIN C) tablet 500 mg  500 mg Oral BID Allie Bossier, MD   500 mg at 07/18/21 [id number]   Chlorhexidine Gluconate Cloth 2 % PADS 6 each  6 each Topical Daily Damita Lack, MD   6 each at 07/17/21 2145   ciprofloxacin (CIPRO) IVPB 400 mg  400 mg Intravenous Q12H Emokpae, Courage, MD 200 mL/hr at 07/18/21 0645 400 mg at 07/18/21 0645   collagenase (SANTYL) ointment   Topical Daily Damita Lack, MD   1 application at [id number]   dextromethorphan-guaiFENesin (Parkin DM) 30-600 MG per 12 hr tablet 1 tablet  1 tablet Oral BID PRN  Amin, Ankit Chirag, MD       donepezil (ARICEPT) tablet 5 mg  5 mg Oral QHS Emokpae, Courage, MD   5 mg at 07/17/21 2132   feeding supplement (ENSURE ENLIVE / ENSURE PLUS) liquid 237 mL  237 mL Oral TID BM Emokpae, Courage, MD   237 mL at [id number]   folic acid (FOLVITE) tablet 1 mg  1 mg Oral Daily Emokpae, Courage, MD   1 mg at 07/18/21 0916   insulin aspart (novoLOG) injection 0-5 Units  0-5 Units Subcutaneous QHS Emokpae, Courage, MD   3 Units at 07/16/21 2220   insulin aspart (novoLOG) injection 0-6 Units  0-6 Units Subcutaneous TID WC Emokpae, Courage, MD   2 Units at 07/18/21 1201   memantine (NAMENDA) tablet 5 mg  5 mg Oral BID Roxan Hockey, MD   5 mg at 07/18/21 0916   multivitamin-lutein (OCUVITE-LUTEIN) capsule 1 capsule  1 capsule Oral Daily Allie Bossier, MD   1 capsule at 07/18/21 0916   ondansetron (ZOFRAN) tablet 4 mg  4 mg Oral Q6H PRN Emokpae, Ejiroghene E, MD       Or   ondansetron (ZOFRAN) injection 4 mg  4 mg Intravenous Q6H PRN Emokpae, Ejiroghene E, MD       oxyCODONE (Oxy IR/ROXICODONE) immediate release tablet 5 mg  5 mg Oral Q4H PRN Amin, Ankit Chirag, MD   5 mg at 07/17/21 2132   predniSONE (DELTASONE) tablet 50 mg  50 mg Oral Q breakfast Emokpae, Courage, MD   50 mg at 07/18/21 0811   senna-docusate (Senokot-S) tablet 1 tablet  1 tablet Oral QHS PRN Amin, Ankit Chirag, MD       sodium chloride flush (NS) 0.9 % injection 10-40 mL  10-40 mL Intracatheter Q12H Amin, Ankit Chirag, MD   10 mL at 07/18/21 0926   sodium chloride flush (NS) 0.9 % injection 10-40 mL  10-40 mL Intracatheter PRN Amin, Ankit Chirag, MD       traZODone (DESYREL) tablet 50 mg  50 mg Oral QHS PRN Amin, Ankit Chirag, MD   50 mg at 07/12/21 2205   zinc sulfate capsule 220 mg  220 mg Oral Daily Allie Bossier, MD   220 mg at 07/18/21 [id number]     Discharge Medications: Please see discharge summary for a list of discharge medications.  Relevant Imaging Results:  Relevant Lab  Results:   Additional Information SS# [id number]  Boneta Lucks, RN

## 2021-07-18 NOTE — Progress Notes (Signed)
PROGRESS NOTE    Taylor Andrews  [id number] DOB: 1934/05/13 DOA: 07/09/2021 PCP: Redmond School, MD   Brief Narrative:  87 y.o. BF PMHx dementia,, hypertension, DM type II controlled, fatty liver disease,  subdural hematoma 2015 came from nursing home for altered mental status and cough.  Admitted 3 weeks ago for dehydration with possible sepsis initially on vancomycin and cefepime, MRI brain was unremarkable, cultures were negative and was discharged on antibiotics.  Admitted to the hospital for sepsis secondary to [id number] infection complicated by pulmonary embolism and Bil DVT. She was started on Heparin drip, transition to p.o. Eliquis   Assessment & Plan:   Principal Problem:   Acute Pulmonary Embolus/DVT Active Problems:   Acute encephalopathy   COVID-19 virus infection   Left leg DVT (HCC)   HTN (hypertension)   DM (diabetes mellitus), type 2 (HCC)   Dementia (HCC)   Severe sepsis (HCC)   AMS (altered mental status)   Decubitus ulcer of sacral region, unstageable ([place])   Sacral osteomyelitis ([place])   Cellulitis of sacral region  1)Severe Sepsis Secondary to cellulitis/infected sacral decubitus ulcer. -X-rays without evidence of osteomyelitis -Procalcitonin was elevated - PICC line- 07/12/21 -She completed Vancomycin x 5 days  - Will Complete Cipro for total of 9 days ---last dose 07/18/21 -Leukocytosis persist due to steroids otherwise sepsis pathophysiology appears to be resolving-  2)Acute Hypoxic Respiratory failure secondary to COVID-19 infection/Pneumonia---   -Completed IV remdesivir -Treated Treated with IV Solu-Medrol switched to Prednisone on 07/18/2022 ----Attempt to maintain euvolemic state --Zinc and vitamin C as ordered -Albuterol inhaler as needed -Accu-Cheks/fingersticks while on high-dose steroids - I-S/flutter valve, bronchodilators, vitamins -Hypoxia has resolved  3)Right large pulmonary embolism with cor pulmonale, submassive PE Bilateral lower  extremity DVT -- Lower extremity Dopplers-positive for bilateral DVT - Echocardiogram - EF 65%  - Given extensive clot burden we continued IV heparin for up to 5 days prior to switching to p.o. Eliquis on 07/16/21 -Plan to discharge on Eliquis for 6 months -Patient was weaned off oxygen  4) Unstageable sacral decubitus ulcer Pressure Injury 07/10/21 Coccyx Medial Unstageable - Full thickness tissue loss in which the base of the injury is covered by slough (yellow, tan, gray, green or brown) and/or eschar (tan, brown or black) in the wound bed. (Active)  07/10/21 0000  Location: Coccyx  Location Orientation: Medial  Staging: Unstageable - Full thickness tissue loss in which the base of the injury is covered by slough (yellow, tan, gray, green or brown) and/or eschar (tan, brown or black) in the wound bed.  Wound Description (Comments):   Present on Admission: Yes  -Antibiotics as above in #1  5)Acute metabolic encephalopathy-superimposed on underlying dementia--most likely due to #1 #2 #3 above -- At baseline can feed herself and occasionally recognize family members but unable to answer all questions.   -Currently unable to do these things---patient is currently Total care with ADLs - CT head negative -c/n Namenda and Aricept  6)Essential hypertension -Blood pressure stable without PTA BP meds -Continue to hold amlodipine and HCTZ  8)Diabetes mellitus type 2 -A1c 5.2 reflecting excellent diabetic control PTA -Current hyperglycemia most likely due to steroids for COVID infection -Sliding scale insulin adjusted downward  9)Social/ethics--palliative care consult appreciated patient is a DNR  10)FTT- Nutritional status Nutrition Problem: Increased nutrient needs Etiology: wound healing Signs/Symptoms: estimated needs Interventions: Ensure Enlive (each supplement provides 350kcal and 20 grams of protein), MVI, Magic cup Body mass index is 19.45 kg/m.  11)H/o subdural  hematoma  2015----monitor closely while on anticoagulation  12) acute on chronic symptomatic anemia--- hemoglobin is stable currently- above 10 after transfusion of PRBC on 07/14/2021  watch closely especially while on anticoagulation,   13)Disposition--patient was admitted from [facility] family care home--they are unable to take patient back at this time as patient is not ambulatory and patient cannot longer feed herself -TOC trying to place patient at another facility however patient will have to be 10 to 11 days out from a positive COVID test prior to another facility being able to accept her  DVT prophylaxis: Heparin drip Code Status: DNR Family Communication:  her niece  [name] is Primary Contact-  Discussed with [facility]CPOA on 9/11/20222 [phone])  Dispo: The patient is from:  [place] assisted living              Anticipated d/c is to: Group home---[facility]               Patient currently is medically stable to d/c.     difficult to place patient Yes -patient was admitted from [place] family care home--they are unable to take patient back at this time as patient is not ambulatory and patient cannot longer feed herself -TOC trying to place patient at another facility however patient will have to be 10 to 11 days out from a positive COVID test prior to another facility being able to accept her  Subjective: - --Resting comfortably, apparently required O2 overnight, will try to wean off O2   Examination:  Physical Exam  Gen:- Awake Alert, pleasantly confused HEENT:- [place].AT, No sclera icterus Neck-Supple Neck,No JVD,.  Lungs-fair air movement bilaterally, no wheezing  CV- S1, S2 normal Abd-  +ve B.Sounds, Abd Soft, No tenderness,    Extremity/Skin:- No  edema,  , bilateral heel pressure injury, sacral decub Psych-baseline cognitive and memory deficits, poor insight and judgment Neuro-generalized weakness, no new focal deficits, no tremors   Objective: Vitals:   07/18/21  0900 07/18/21 0901 07/18/21 1000 07/18/21 1143  BP: (!) 149/31  (!) 166/36 140/72  Pulse:    60  Resp:  10 12   Temp:   (!) 97.5 F (36.4 C) 97.6 F (36.4 C)  TempSrc:   Axillary Oral  SpO2: 100%  98% 98%  Weight:      Height:        Intake/Output Summary (Last 24 hours) at 07/18/2021 1954 Last data filed at 07/18/2021 1700 Gross per 24 hour  Intake 1110.14 ml  Output 750 ml  Net 360.14 ml   Filed Weights   07/11/21 0500 07/13/21 0248 07/14/21 0527  Weight: 43.4 kg 45.6 kg 46.7 kg     Data Reviewed:   CBC: Recent Labs  Lab 07/12/21 0500 07/12/21 0906 07/13/21 0300 07/14/21 0129 07/14/21 0827 07/15/21 0317 07/16/21 0410 07/17/21 0435 07/18/21 0516  WBC 24.2*  --  24.5* 23.0* 26.7* 30.5* 35.2* 30.0* 29.0*  NEUTROABS 21.9*  --  22.3* 20.9*  --  27.0*  --   --   --   HGB 8.7*  --  8.2* 7.0* 7.6* 9.7* 10.6* 9.7* 10.7*  HCT 26.6*   < > 25.4* 22.3* 23.7* 29.2* 32.3* 30.3* 33.3*  MCV 86.9  --  87.3 88.1 88.1 84.6 85.7 86.8 88.1  PLT 299  --  302 272 295 299 297 265 270   < > = values in this interval not displayed.   Basic Metabolic Panel: Recent Labs  Lab 07/12/21 0500 07/12/21 1715 07/13/21  0300 07/13/21 0919 07/13/21 1612 07/14/21 0129 07/15/21 0317 07/16/21 0410 07/17/21 0435  NA 139   < > 139   < > 142 135 139 140 138  K 2.7*   < > 2.8*   < > 3.2* 4.0 2.9* 3.4* 3.6  CL 107   < > 115*   < > 110 107 106 105 107  CO2 26   < > 21*   < > '24 23 26 27 27  '$ GLUCOSE 137*   < > 197*   < > 132* 283* 162* 196* 149*  BUN 15   < > 14   < > '13 11 16 18 19  '$ CREATININE 0.55   < > 0.48   < > 0.58 0.52 0.57 0.56 0.59  CALCIUM 7.7*   < > 6.4*   < > 8.8* 7.0* 7.8* 7.9* 7.9*  MG 2.1  --  1.7  --   --  2.1 2.0  --   --   PHOS 2.3*  --  2.3*  --   --  4.4 2.5  --   --    < > = values in this interval not displayed.   GFR: Estimated Creatinine Clearance: 38.6 mL/min (by C-G formula based on SCr of 0.59 mg/dL). Liver Function Tests: Recent Labs  Lab 07/12/21 0500  07/13/21 0300 07/13/21 0919 07/14/21 0129 07/15/21 0317  AST '15 19 22 16 15  '$ ALT '15 16 19 12 15  '$ ALKPHOS 56 54 64 43 50  BILITOT 0.4 0.2* 0.3 0.2* 0.8  PROT 5.2* 4.7* 5.5* 6.0* 4.8*  ALBUMIN 1.9* 1.7* 2.0* 3.9 2.5*   No results for input(s): LIPASE, AMYLASE in the last 168 hours. No results for input(s): AMMONIA in the last 168 hours. Coagulation Profile: No results for input(s): INR, PROTIME in the last 168 hours. Cardiac Enzymes: No results for input(s): CKTOTAL, CKMB, CKMBINDEX, TROPONINI in the last 168 hours. BNP (last 3 results) No results for input(s): PROBNP in the last 8760 hours. HbA1C: No results for input(s): HGBA1C in the last 72 hours. CBG: Recent Labs  Lab 07/17/21 1709 07/17/21 2106 07/18/21 0810 07/18/21 1148 07/18/21 1632  GLUCAP 129* 154* 224* 201* 188*   Lipid Profile: No results for input(s): CHOL, HDL, LDLCALC, TRIG, CHOLHDL, LDLDIRECT in the last 72 hours. Thyroid Function Tests: No results for input(s): TSH, T4TOTAL, FREET4, T3FREE, THYROIDAB in the last 72 hours.  Anemia Panel: No results for input(s): VITAMINB12, FOLATE, FERRITIN, TIBC, IRON, RETICCTPCT in the last 72 hours.  Sepsis Labs: Recent Labs  Lab 07/11/21 2029 07/11/21 2338 07/12/21 0239 07/12/21 0500  PROCALCITON  --   --   --  0.56  LATICACIDVEN 1.2 1.3 1.8  --     Recent Results (from the past 240 hour(s))  Resp Panel by RT-PCR (Flu A&B, Covid) Nasopharyngeal Swab     Status: Abnormal   Collection Time: 07/09/21  5:41 PM   Specimen: Nasopharyngeal Swab; Nasopharyngeal(NP) swabs in vial transport medium  Result Value Ref Range Status   SARS Coronavirus 2 by RT PCR POSITIVE (A) NEGATIVE Final    Comment: CRITICAL RESULT CALLED TO, READ BACK BY AND VERIFIED WITH: [place] 07/09/2021 COLEMAN,R (NOTE) SARS-CoV-2 target nucleic acids are DETECTED.  The SARS-CoV-2 RNA is generally detectable in upper respiratory specimens during the acute phase of infection. Positive  results are indicative of the presence of the identified virus, but do not rule out bacterial infection or co-infection with other pathogens not detected by the test.  Clinical correlation with patient history and other diagnostic information is necessary to determine patient infection status. The expected result is Negative.  Fact Sheet for Patients: EntrepreneurPulse.com.au  Fact Sheet for Healthcare Providers: IncredibleEmployment.be  This test is not yet approved or cleared by the [place] FDA and  has been authorized for detection and/or diagnosis of SARS-CoV-2 by FDA under an Emergency Use Authorization (EUA).  This EUA will remain in effect (meaning this t est can be used) for the duration of  the COVID-19 declaration under Section 564(b)(1) of the Act, 21 U.S.C. section 360bbb-3(b)(1), unless the authorization is terminated or revoked sooner.     Influenza A by PCR NEGATIVE NEGATIVE Final   Influenza B by PCR NEGATIVE NEGATIVE Final    Comment: (NOTE) The Xpert Xpress SARS-CoV-2/FLU/RSV plus assay is intended as an aid in the diagnosis of influenza from Nasopharyngeal swab specimens and should not be used as a sole basis for treatment. Nasal washings and aspirates are unacceptable for Xpert Xpress SARS-CoV-2/FLU/RSV testing.  Fact Sheet for Patients: EntrepreneurPulse.com.au  Fact Sheet for Healthcare Providers: IncredibleEmployment.be  This test is not yet approved or cleared by the [place] FDA and has been authorized for detection and/or diagnosis of SARS-CoV-2 by FDA under an Emergency Use Authorization (EUA). This EUA will remain in effect (meaning this test can be used) for the duration of the COVID-19 declaration under Section 564(b)(1) of the Act, 21 U.S.C. section 360bbb-3(b)(1), unless the authorization is terminated or revoked.  Performed at [facility], [address],  [place], [place] [address]   Culture, blood (routine x 2)     Status: None   Collection Time: 07/09/21  5:50 PM   Specimen: Right Antecubital; Blood  Result Value Ref Range Status   Specimen Description RIGHT ANTECUBITAL  Final   Special Requests   Final    BOTTLES DRAWN AEROBIC AND ANAEROBIC Blood Culture adequate volume   Culture   Final    NO GROWTH 5 DAYS Performed at [facility], [address] [place], [place] [address]    Report Status 07/14/2021 FINAL  Final  Culture, blood (routine x 2)     Status: None   Collection Time: 07/09/21  6:14 PM   Specimen: BLOOD RIGHT ARM  Result Value Ref Range Status   Specimen Description BLOOD RIGHT ARM  Final   Special Requests   Final    BOTTLES DRAWN AEROBIC ONLY Blood Culture adequate volume   Culture   Final    NO GROWTH 5 DAYS Performed at [facility], [address] [place], [place] [address]    Report Status 07/14/2021 FINAL  Final  MRSA Next Gen by PCR, Nasal     Status: None   Collection Time: 07/09/21 10:44 PM   Specimen: Nasal Mucosa; Nasal Swab  Result Value Ref Range Status   MRSA by PCR Next Gen NOT DETECTED NOT DETECTED Final    Comment: (NOTE) The GeneXpert MRSA Assay (FDA approved for NASAL specimens only), is one component of a comprehensive MRSA colonization surveillance program. It is not intended to diagnose MRSA infection nor to guide or monitor treatment for MRSA infections. Test performance is not FDA approved in patients less than 21 years old. Performed at [facility], [address] [place], [place] [address]       Radiology Studies: No results found.  Scheduled Meds:  apixaban  10 mg Oral BID   Followed by   Derrill Memo ON 07/23/2021] apixaban  5 mg Oral BID  vitamin C  500 mg Oral BID   Chlorhexidine Gluconate Cloth  6 each Topical Daily   collagenase   Topical Daily   donepezil  5 mg Oral QHS   feeding supplement  237 mL Oral TID BM   folic acid  1 mg Oral Daily   insulin aspart  0-5 Units  Subcutaneous QHS   insulin aspart  0-6 Units Subcutaneous TID WC   memantine  5 mg Oral BID   multivitamin-lutein  1 capsule Oral Daily   predniSONE  50 mg Oral Q breakfast   sodium chloride flush  10-40 mL Intracatheter Q12H   zinc sulfate  220 mg Oral Daily   Continuous Infusions:  ciprofloxacin 400 mg (07/18/21 1719)    LOS: 8 days   Roxan Hockey, MD [facility]  If 7PM-7AM, please contact night-coverage  07/18/2021, 7:54 PM

## 2021-07-18 NOTE — Discharge Instructions (Signed)
Information on my medicine - ELIQUIS (apixaban)  This medication education was reviewed with me or my healthcare representative as part of my discharge preparation.  The pharmacist that spoke with me during my hospital stay was:  Ramond Craver, [facility]  Why was Eliquis prescribed for you? Eliquis was prescribed to treat blood clots that may have been found in the veins of your legs (deep vein thrombosis) or in your lungs (pulmonary embolism) and to reduce the risk of them occurring again.  What do You need to know about Eliquis ? The starting dose is 10 mg (two 5 mg tablets) taken TWICE daily for the FIRST SEVEN (7) DAYS, then on 07/23/2021 PM dose the dose is reduced to ONE 5 mg tablet taken TWICE daily.  Eliquis may be taken with or without food.   Try to take the dose about the same time in the morning and in the evening. If you have difficulty swallowing the tablet whole please discuss with your pharmacist how to take the medication safely.  Take Eliquis exactly as prescribed and DO NOT stop taking Eliquis without talking to the doctor who prescribed the medication.  Stopping may increase your risk of developing a new blood clot.  Refill your prescription before you run out.  After discharge, you should have regular check-up appointments with your healthcare provider that is prescribing your Eliquis.    What do you do if you miss a dose? If a dose of ELIQUIS is not taken at the scheduled time, take it as soon as possible on the same day and twice-daily administration should be resumed. The dose should not be doubled to make up for a missed dose.  Important Safety Information A possible side effect of Eliquis is bleeding. You should call your healthcare provider right away if you experience any of the following: Bleeding from an injury or your nose that does not stop. Unusual colored urine (red or dark brown) or unusual colored stools (red or black). Unusual bruising for unknown  reasons. A serious fall or if you hit your head (even if there is no bleeding).  Some medicines may interact with Eliquis and might increase your risk of bleeding or clotting while on Eliquis. To help avoid this, consult your healthcare provider or pharmacist prior to using any new prescription or non-prescription medications, including herbals, vitamins, non-steroidal anti-inflammatory drugs (NSAIDs) and supplements.  This website has more information on Eliquis (apixaban): http://www.eliquis.com/eliquis/home

## 2021-07-19 LAB — CBC
HCT: 33.6 % — ABNORMAL LOW (ref 36.0–46.0)
Hemoglobin: 10.7 g/dL — ABNORMAL LOW (ref 12.0–15.0)
MCH: 27.9 pg (ref 26.0–34.0)
MCHC: 31.8 g/dL (ref 30.0–36.0)
MCV: 87.5 fL (ref 80.0–100.0)
Platelets: 199 10*3/uL (ref 150–400)
RBC: 3.84 MIL/uL — ABNORMAL LOW (ref 3.87–5.11)
RDW: 17.5 % — ABNORMAL HIGH (ref 11.5–15.5)
WBC: 18.6 10*3/uL — ABNORMAL HIGH (ref 4.0–10.5)
nRBC: 0.2 % (ref 0.0–0.2)

## 2021-07-19 LAB — GLUCOSE, CAPILLARY
Glucose-Capillary: 94 mg/dL (ref 70–99)
Glucose-Capillary: 117 mg/dL — ABNORMAL HIGH (ref 70–99)
Glucose-Capillary: 246 mg/dL — ABNORMAL HIGH (ref 70–99)
Glucose-Capillary: 183 mg/dL — ABNORMAL HIGH (ref 70–99)

## 2021-07-19 MED ORDER — OCUVITE-LUTEIN PO CAPS: 1.0000 | ORAL_CAPSULE | Freq: Every day | ORAL | 0 refills | Status: AC

## 2021-07-19 MED ORDER — APIXABAN 5 MG PO TABS: 10.0000 mg | ORAL_TABLET | Freq: Two times a day (BID) | ORAL | 0 refills | Status: AC

## 2021-07-19 MED ORDER — ASCORBIC ACID 500 MG PO TABS: 500.0000 mg | ORAL_TABLET | Freq: Two times a day (BID) | ORAL | 0 refills | Status: DC

## 2021-07-19 MED ORDER — FOLIC ACID 1 MG PO TABS: 1.0000 mg | ORAL_TABLET | Freq: Every day | ORAL | 0 refills | Status: DC

## 2021-07-19 MED ORDER — APIXABAN 5 MG PO TABS: 5.0000 mg | ORAL_TABLET | Freq: Two times a day (BID) | ORAL | 2 refills | Status: AC

## 2021-07-19 MED ORDER — DM-GUAIFENESIN ER 30-600 MG PO TB12: 1.0000 | ORAL_TABLET | Freq: Two times a day (BID) | ORAL | 0 refills | Status: AC | PRN

## 2021-07-19 NOTE — Plan of Care (Signed)
  Problem: Acute Rehab PT Goals(only PT should resolve) Goal: Pt Will Go Supine/Side To Sit Outcome: Progressing Flowsheets (Taken 07/19/2021 1608) Pt will go Supine/Side to Sit: with moderate assist Goal: Patient Will Transfer Sit To/From Stand Outcome: Progressing Flowsheets (Taken 07/19/2021 1608) Patient will transfer sit to/from stand: with moderate assist Goal: Pt Will Transfer Bed To Chair/Chair To Bed Outcome: Progressing Flowsheets (Taken 07/19/2021 1608) Pt will Transfer Bed to Chair/Chair to Bed:  with mod assist  with max assist Goal: Pt Will Ambulate Outcome: Progressing Flowsheets (Taken 07/19/2021 1608) Pt will Ambulate:  with moderate assist  with maximum assist  with rolling walker  10 feet   4:09 PM, 07/19/21 Lonell Grandchild, MPT Physical Therapist with [facility] [phone] office [phone] mobile phone

## 2021-07-19 NOTE — Discharge Summary (Signed)
Physician Discharge Summary [facility]    Patient: Taylor Andrews                   Admit date: 07/09/2021   DOB: August 05, 1934             Discharge date:07/19/2021/1:05 PM [id number]                          PCP: Redmond School, MD  Disposition:  SNF   Recommendations for Outpatient Follow-up:   Follow up: PCP within 1-2 weeks Follow-up with primary provider at SNF  Discharge Condition: Stable   Code Status:   Code Status: DNR  Diet recommendation: Regular healthy diet   Discharge Diagnoses:    Principal Problem:   Acute Pulmonary Embolus/DVT Active Problems:   Acute encephalopathy   Dementia ([place])   HTN (hypertension)   DM (diabetes mellitus), type 2 ([place])   COVID-19 virus infection   Severe sepsis ([place])   AMS (altered mental status)   Decubitus ulcer of sacral region, unstageable ([place])   Sacral osteomyelitis ([place])   Cellulitis of sacral region   Left leg DVT ([place])   History of Present Illness/ Hospital Course [name] Summary:    87 y.o. BF PMHx dementia,, hypertension, DM type II controlled, fatty liver disease,  subdural hematoma 2015 came from nursing home for altered mental status and cough.  Admitted 3 weeks ago for dehydration with possible sepsis initially on vancomycin and cefepime, MRI brain was unremarkable, cultures were negative and was discharged on antibiotics.  Admitted to the hospital for sepsis secondary to [id number] infection complicated by pulmonary embolism and Bil DVT. She was started on Heparin drip, transition to p.o. Eliquis    1)Severe Sepsis Secondary to cellulitis/infected sacral decubitus ulcer. -X-rays without evidence of osteomyelitis -Procalcitonin was elevated - PICC line- 07/12/21 -She completed Vancomycin x 5 days  - Completed Cipro for total of 9 days ---last dose 07/18/21 -Leukocytosis persist due to steroids otherwise sepsis pathophysiology appears to be resolving-   2)Acute Hypoxic Respiratory failure secondary to  COVID-19 infection/Pneumonia---   -Completed IV remdesivir Treated with IV Solu-Medrol switched to Prednisone on 07/18/2022 ----Attempt to maintain euvolemic state --Zinc and vitamin C  -Albuterol inhaler as needed -Accu-Cheks/fingersticks while on high-dose steroids - I-S/flutter valve, bronchodilators, vitamins -Hypoxia has resolved   3)Right large pulmonary embolism with cor pulmonale, submassive PE Bilateral lower extremity DVT -- Lower extremity Dopplers-positive for bilateral DVT - Echocardiogram - EF 65%  - Given extensive clot burden we continued IV heparin for up to 5 days prior to switching to p.o. Eliquis on 07/16/21 -Plan to discharge on Eliquis for 6 months -Patient was weaned off oxygen   4) Unstageable sacral decubitus ulcer Pressure Injury 07/10/21 Coccyx Medial Unstageable - Full thickness tissue loss in which the base of the injury is covered by slough (yellow, tan, gray, green or brown) and/or eschar (tan, brown or black) in the wound bed. (Active)  07/10/21 0000  Location: Coccyx  Location Orientation: Medial  Staging: Unstageable - Full thickness tissue loss in which the base of the injury is covered by slough (yellow, tan, gray, green or brown) and/or eschar (tan, brown or black) in the wound bed.  Wound Description (Comments):   Present on Admission: Yes  -Antibiotics as above in #1   5)Acute metabolic encephalopathy-superimposed on underlying dementia--most likely due to #1 #2 #3 above -- At baseline can feed herself and occasionally recognize family members  but unable to answer all questions.   -Currently unable to do these things---patient is currently [facility] care with ADLs - CT head negative -c/n Namenda and Aricept   6)Essential hypertension -Blood pressure stable without PTA BP meds -Continue amlodipine -discontinued HCTZ  8)Diabetes mellitus type 2 -A1c 5.2 reflecting excellent diabetic control PTA -Current hyperglycemia most likely due to steroids  for COVID infection -Sliding scale insulin adjusted downward   9)Social/ethics--palliative care consult appreciated patient is a DNR   10)FTT- Nutritional status Nutrition Problem: Increased nutrient needs Etiology: wound healing Signs/Symptoms: estimated needs Interventions: Ensure Enlive (each supplement provides 350kcal and 20 grams of protein), MVI, Magic cup Body mass index is 19.45 kg/m.   11)H/o subdural hematoma 2015----monitor closely while on anticoagulation   12) acute on chronic symptomatic anemia--- hemoglobin is stable currently- above 10 after transfusion of PRBC on 07/14/2021  watch closely especially while on anticoagulation,    13)Disposition--patient was admitted from [facility] family care home-- Family has agreed to SNF,   11 days out from a positive COVID test    Code Status: DNR Family Communication:  her niece  [name] is Primary Contact-  Discussed with HCPOA on 9/11/20222 ([phone])     Nutritional status:  Nutrition Problem: Increased nutrient needs Etiology: wound healing Signs/Symptoms: estimated needs Interventions: Ensure Enlive (each supplement provides 350kcal and 20 grams of protein), MVI, Magic cup  The patient's BMI is: Body mass index is 19.45 kg/m. I agree with the assessment and plan as outlined below:  SKIN assessment: I agree with skin assessment and plan as outlined below: Pressure Injury 07/10/21 Coccyx Medial Unstageable - Full thickness tissue loss in which the base of the injury is covered by slough (yellow, tan, gray, green or brown) and/or eschar (tan, brown or black) in the wound bed. (Active)  07/10/21 0000  Location: Coccyx  Location Orientation: Medial  Staging: Unstageable - Full thickness tissue loss in which the base of the injury is covered by slough (yellow, tan, gray, green or brown) and/or eschar (tan, brown or black) in the wound bed.  Wound Description (Comments):   Present on Admission: Yes    Risk  of unplanned readmission Score:     Discharge Instructions:   Discharge Instructions     Activity as tolerated - No restrictions   Complete by: As directed    Diet - low sodium heart healthy   Complete by: As directed    Discharge instructions   Complete by: As directed    F/up at SNF ...   Discharge wound care:   Complete by: As directed    Per wound care nurse instructions Recommending consulting wound care at the facility   Increase activity slowly   Complete by: As directed         Medication List     STOP taking these medications    hydrochlorothiazide 25 MG tablet Commonly known as: HYDRODIURIL   LORazepam 0.5 MG tablet Commonly known as: ATIVAN       TAKE these medications    amLODipine 10 MG tablet Commonly known as: NORVASC Take 10 mg by mouth daily.   apixaban 5 MG Tabs tablet Commonly known as: ELIQUIS Take 2 tablets (10 mg total) by mouth 2 (two) times daily for 3 days.   apixaban 5 MG Tabs tablet Commonly known as: ELIQUIS Take 1 tablet (5 mg total) by mouth 2 (two) times daily. Start taking on: July 23, 2021   ascorbic acid 500 MG tablet Commonly  known as: VITAMIN C Take 1 tablet (500 mg total) by mouth 2 (two) times daily for 5 days.   dextromethorphan-guaiFENesin 30-600 MG 12hr tablet Commonly known as: MUCINEX DM Take 1 tablet by mouth 2 (two) times daily as needed for up to 10 days for cough.   docusate sodium 100 MG capsule Commonly known as: COLACE Take 100 mg by mouth 3 (three) times daily.   donepezil 10 MG tablet Commonly known as: ARICEPT Take 10 mg by mouth daily.   Fish Oil 1000 MG Caps Take 1 capsule by mouth 2 (two) times daily with a meal.   folic acid 1 MG tablet Commonly known as: FOLVITE Take 1 tablet (1 mg total) by mouth daily for 5 days. Start taking on: July 20, 2021   memantine 10 MG tablet Commonly known as: NAMENDA Take 10 mg by mouth 2 (two) times daily.   multivitamin-lutein Caps  capsule Take 1 capsule by mouth daily. Start taking on: July 20, 2021   niacin 500 MG CR tablet Commonly known as: NIASPAN Take 500 mg by mouth daily.   oxybutynin 5 MG tablet Commonly known as: DITROPAN Take 5 mg by mouth 2 (two) times daily.   potassium chloride SA 20 MEQ tablet Commonly known as: KLOR-CON Take 20 mEq by mouth daily.   Probiotic Blend Caps Take 1 capsule by mouth daily.        Contact information for after-discharge care     [facility] .   Service: Skilled Nursing Contact information: [place] [phone]                    Allergies  Allergen Reactions   Milk-Related Compounds Other (See Comments)    Lactose Intolerant    Iohexol Other (See Comments)     Desc: PT STATES THAT THE CONTRAST THAT SHE HAD IN 2004 MADE HER SICK, PT COULD NOT SPECIFY REACTION TYPES      Procedures /Studies:   DG Sacrum/Coccyx  Result Date: 07/10/2021 CLINICAL DATA:  Pressure ulcer at sacrum with yellowish discharge, dimension EXAM: SACRUM AND COCCYX - 2+ VIEW COMPARISON:  04/05/2009 FINDINGS: SI joints and sacral foramina preserved. Diffuse osseous demineralization. Dressing artifacts dorsal to sacrum. No sacrococcygeal fracture or definite bone destruction identified though the dorsal margin of the distal sacrum is very near the skin surface on lateral view. IMPRESSION: No definite bone destruction is seen to suggest sacrococcygeal osteomyelitis. Electronically Signed   By: Lavonia Dana M.D.   On: 07/10/2021 13:43   CT Head Wo Contrast  Result Date: 07/09/2021 CLINICAL DATA:  Mental status changes, history diabetes mellitus, hypertension, dementia; prior history of subdural hematoma and craniotomy EXAM: CT HEAD WITHOUT CONTRAST TECHNIQUE: Contiguous axial images were obtained from the base of the skull through the vertex without intravenous contrast. Sagittal and  coronal MPR images reconstructed from axial data set. COMPARISON:  06/18/2021 FINDINGS: Brain: Generalized atrophy. Normal ventricular morphology. No midline shift or mass effect. Small vessel chronic ischemic changes of deep cerebral white matter. No intracranial hemorrhage, mass lesion, or evidence of acute infarction. Vascular: No hyperdense vessels Skull: Prior RIGHT frontal craniotomy.  Skull otherwise intact. Sinuses/Orbits: Air-fluid levels and mucus within LEFT maxillary and RIGHT sphenoid sinuses. Remaining paranasal sinuses mastoid air cells clear. Other: N/A IMPRESSION: Atrophy with small vessel chronic ischemic changes of deep cerebral white matter. No acute intracranial abnormalities. Prior RIGHT frontal craniotomy. Sphenoid and LEFT  maxillary sinus disease. Electronically Signed   By: Lavonia Dana M.D.   On: 07/09/2021 17:25   CT Angio Chest Pulmonary Embolism (PE) W or WO Contrast  Result Date: 07/11/2021 CLINICAL DATA:  Elevated D-dimer.  COVID-19. EXAM: CT ANGIOGRAPHY CHEST WITH CONTRAST TECHNIQUE: Multidetector CT imaging of the chest was performed using the standard protocol during bolus administration of intravenous contrast. Multiplanar CT image reconstructions and MIPs were obtained to evaluate the vascular anatomy. CONTRAST:  57m OMNIPAQUE IOHEXOL 350 MG/ML SOLN COMPARISON:  May 27, 2012. FINDINGS: Cardiovascular: Large linear filling defect is noted in distal right pulmonary artery which extends into the upper and lower lobe branches consistent with acute pulmonary embolus. RV/LV ratio of 1.4 is noted consistent with right heart strain. Pericardial effusion is noted. Atherosclerosis of thoracic aorta is noted without aneurysm or dissection. Mediastinum/Nodes: No enlarged mediastinal, hilar, or axillary lymph nodes. Thyroid gland, trachea, and esophagus demonstrate no significant findings. Lungs/Pleura: No pneumothorax or pleural effusion is noted. Minimal bilateral posterior basilar  subsegmental atelectasis is noted. Multiple focal airspace opacities are noted in the right upper lobe which may represent multifocal pneumonia. Upper Abdomen: No acute abnormality. Musculoskeletal: No chest wall abnormality. No acute or significant osseous findings. Review of the MIP images confirms the above findings. IMPRESSION: Large right-sided pulmonary embolus is noted which extends into upper and lower lobe branches. Positive for acute PE with CT evidence of right heart strain (RV/LV Ratio = 1.4) consistent with at least submassive (intermediate risk) PE. The presence of right heart strain has been associated with an increased risk of morbidity and mortality. Please refer to the "PE Focused" order set in EPIC. Critical Value/emergent results were called by telephone at the time of interpretation on 07/11/2021 at 2:35 pm to provider [facility], who verbally acknowledged these results. Multiple small focal airspace opacities are noted in the right upper lobe consistent with multifocal pneumonia. Aortic Atherosclerosis (ICD10-I70.0). Electronically Signed   By: JMarijo ConceptionM.D.   On: 07/11/2021 14:35   [place] Img Lower Bilateral (DVT)  Result Date: 07/11/2021 CLINICAL DATA:  Elevated D-dimer EXAM: BILATERAL LOWER EXTREMITY VENOUS DOPPLER ULTRASOUND TECHNIQUE: Gray-scale sonography with graded compression, as well as color Doppler and duplex ultrasound were performed to evaluate the lower extremity deep venous systems from the level of the common femoral vein and including the common femoral, femoral, profunda femoral, popliteal and calf veins including the posterior tibial, peroneal and gastrocnemius veins when visible. The superficial great saphenous vein was also interrogated. Spectral Doppler was utilized to evaluate flow at rest and with distal augmentation maneuvers in the common femoral, femoral and popliteal veins. COMPARISON:  None. FINDINGS: RIGHT LOWER EXTREMITY Common Femoral Vein: No  evidence of thrombus. Normal compressibility, respiratory phasicity and response to augmentation. Saphenofemoral Junction: No evidence of thrombus. Normal compressibility and flow on color Doppler imaging. Profunda Femoral Vein: No evidence of thrombus. Normal compressibility and flow on color Doppler imaging. Femoral Vein: No evidence of thrombus. Normal compressibility, respiratory phasicity and response to augmentation. Popliteal Vein: No evidence of thrombus. Normal compressibility, respiratory phasicity and response to augmentation. Calf Veins: No evidence of thrombus. Normal compressibility and flow on color Doppler imaging. LEFT LOWER EXTREMITY Common Femoral Vein: No evidence of thrombus. Normal compressibility and color Doppler flow. Saphenofemoral Junction: No evidence of thrombus. Normal compressibility and flow on color Doppler imaging. Profunda Femoral Vein: No evidence of thrombus. Normal compressibility and flow on color Doppler imaging. Femoral Vein: Normal compressibility in the proximal and  mid femoral vein. Incomplete compressibility of the distal left femoral vein. Wall thickening involving the mid and distal femoral vein. There is flow within the mid and distal femoral vein. Popliteal Vein: Incomplete compressibility in the left popliteal vein with wall thickening. Color Doppler flow in the left popliteal vein. Calf Veins: Limited evaluation. Other Findings:  None. IMPRESSION: 1. Age-indeterminate thrombus in the left lower extremity, involving the left femoral vein and left popliteal vein. Findings are suggestive for chronic DVT rather than acute DVT. 2.  Negative for deep venous thrombosis in right lower extremity. These results will be called to the ordering clinician or representative by the Radiologist Assistant, and communication documented in the PACS or [employer]. Electronically Signed   By: Markus Daft M.D.   On: 07/11/2021 12:49   DG Chest Port 1 View  Result Date:  07/09/2021 CLINICAL DATA:  Cough and weakness.  Altered mental status. EXAM: PORTABLE CHEST 1 VIEW COMPARISON:  06/18/2021 FINDINGS: The cardiomediastinal contours are normal. Atherosclerosis of the aorta. Pulmonary vasculature is normal. No consolidation, pleural effusion, or pneumothorax. Diffuse bony under mineralization. No acute osseous abnormalities are seen. IMPRESSION: No acute chest findings. Electronically Signed   By: Keith Rake M.D.   On: 07/09/2021 17:47   ECHOCARDIOGRAM COMPLETE  Result Date: 07/12/2021    ECHOCARDIOGRAM REPORT   Patient Name:   QUANA MCLANAHAN Date of Exam: 07/12/2021 Medical Rec #:  [id number]    Height:       61.0 in Accession #:    [id number]   Weight:       95.7 lb Date of Birth:  07-16-1934   BSA:          1.381 m Patient Age:    89 years     BP:           153/38 mmHg Patient Gender: F            HR:           67 bpm. Exam Location:  [facility] Procedure: 2D Echo, Cardiac Doppler and Color Doppler Indications:    CHF-Acute Systolic [id number]  History:        Patient has no prior history of Echocardiogram examinations.                 Risk Factors:Hypertension and Dyslipidemia. COVID-19 virus                 infection. Dementia ([place]). Severe sepsis ([place]).  Sonographer:    Alvino Chapel RCS Referring Phys: [id number] [place]  Sonographer Comments: Image acquisition challenging due to patient behavioral factors. IMPRESSIONS  1. Left ventricular ejection fraction, by estimation, is 65 to 70%. The left ventricle has normal function. The left ventricle has no regional wall motion abnormalities. Left ventricular diastolic parameters are consistent with Grade I diastolic dysfunction (impaired relaxation).  2. Right ventricular systolic function is normal. The right ventricular size is normal. There is normal pulmonary artery systolic pressure.  3. The mitral valve is normal in structure. No evidence of mitral valve regurgitation. No evidence of mitral stenosis.  4. The aortic valve  has an indeterminant number of cusps. There is moderate calcification of the aortic valve. There is moderate thickening of the aortic valve. Aortic valve regurgitation is moderate. No aortic stenosis is present.  5. The inferior vena cava is normal in size with greater than 50% respiratory variability, suggesting right atrial pressure of 3 mmHg. FINDINGS  Left Ventricle: Left ventricular ejection  fraction, by estimation, is 65 to 70%. The left ventricle has normal function. The left ventricle has no regional wall motion abnormalities. The left ventricular internal cavity size was normal in size. There is  no left ventricular hypertrophy. Left ventricular diastolic parameters are consistent with Grade I diastolic dysfunction (impaired relaxation). Normal left ventricular filling pressure. Right Ventricle: The right ventricular size is normal. No increase in right ventricular wall thickness. Right ventricular systolic function is normal. There is normal pulmonary artery systolic pressure. The tricuspid regurgitant velocity is 2.55 m/s, and  with an assumed right atrial pressure of 3 mmHg, the estimated right ventricular systolic pressure is 0000000 mmHg. Left Atrium: Left atrial size was normal in size. Right Atrium: Right atrial size was normal in size. Pericardium: There is no evidence of pericardial effusion. Mitral Valve: The mitral valve is normal in structure. No evidence of mitral valve regurgitation. No evidence of mitral valve stenosis. Tricuspid Valve: The tricuspid valve is normal in structure. Tricuspid valve regurgitation is mild . No evidence of tricuspid stenosis. Aortic Valve: The aortic valve has an indeterminant number of cusps. There is moderate calcification of the aortic valve. There is moderate thickening of the aortic valve. There is moderate aortic valve annular calcification. Aortic valve regurgitation is moderate. Aortic regurgitation PHT measures 295 msec. No aortic stenosis is present. Aortic  valve mean gradient measures 6.5 mmHg. Aortic valve peak gradient measures 15.6 mmHg. Aortic valve area, by VTI measures 1.53 cm. Pulmonic Valve: The pulmonic valve was not well visualized. Pulmonic valve regurgitation is not visualized. No evidence of pulmonic stenosis. Aorta: The aortic root is normal in size and structure. Venous: The inferior vena cava is normal in size with greater than 50% respiratory variability, suggesting right atrial pressure of 3 mmHg. IAS/Shunts: No atrial level shunt detected by color flow Doppler.  LEFT VENTRICLE PLAX 2D LVIDd:         3.60 cm  Diastology LVIDs:         1.80 cm  LV e' medial:    7.72 cm/s LV PW:         0.90 cm  LV E/e' medial:  12.3 LV IVS:        1.10 cm  LV e' lateral:   8.38 cm/s LVOT diam:     1.90 cm  LV E/e' lateral: 11.3 LV SV:         63 LV SV Index:   46 LVOT Area:     2.84 cm  RIGHT VENTRICLE RV S prime:     21.60 cm/s TAPSE (M-mode): 2.0 cm LEFT ATRIUM             Index       RIGHT ATRIUM          Index LA diam:        2.10 cm 1.52 cm/m  RA Area:     9.76 cm LA Vol (A2C):   17.5 ml 12.68 ml/m RA Volume:   21.70 ml 15.72 ml/m LA Vol (A4C):   36.5 ml 26.44 ml/m LA Biplane Vol: 26.6 ml 19.27 ml/m  AORTIC VALVE AV Area (Vmax):    1.39 cm AV Area (Vmean):   1.31 cm AV Area (VTI):     1.53 cm AV Vmax:           197.63 cm/s AV Vmean:          114.059 cm/s AV VTI:            0.414 m  AV Peak Grad:      15.6 mmHg AV Mean Grad:      6.5 mmHg LVOT Vmax:         96.80 cm/s LVOT Vmean:        52.900 cm/s LVOT VTI:          0.223 m LVOT/AV VTI ratio: 0.54 AI PHT:            295 msec  AORTA Ao Root diam: 2.80 cm MITRAL VALVE                TRICUSPID VALVE MV Area (PHT): 2.47 cm     TR Peak grad:   26.0 mmHg MV Decel Time: 307 msec     TR Vmax:        255.00 cm/s MV E velocity: 95.10 cm/s MV A velocity: 113.00 cm/s  SHUNTS MV E/A ratio:  0.84         Systemic VTI:  0.22 m                             Systemic Diam: 1.90 cm Carlyle Dolly MD Electronically signed  by Carlyle Dolly MD Signature Date/Time: 07/12/2021/3:52:08 PM    Final    [place] EKG SITE RITE  Result Date: 07/11/2021 If Site Rite image not attached, placement could not be confirmed due to current cardiac rhythm.   Subjective:   Patient was seen and examined 07/19/2021, 1:05 PM Patient stable today. No acute distress.  No issues overnight Stable for discharge.  Discharge Exam:    Vitals:   07/18/21 1000 07/18/21 1143 07/18/21 2133 07/19/21 0450  BP: (!) 166/36 140/72 (!) 139/40 (!) 149/47  Pulse:  60 62 67  Resp: '12  20 18  '$ Temp: (!) 97.5 F (36.4 C) 97.6 F (36.4 C) 98.3 F (36.8 C) 98.1 F (36.7 C)  TempSrc: Axillary Oral  Oral  SpO2: 98% 98% 100% 100%  Weight:      Height:        General: Pt lying comfortably in bed & appears in no obvious distress. Cardiovascular: S1 & S2 heard, RRR, S1/S2 +. No murmurs, rubs, gallops or clicks. No JVD or pedal edema. Respiratory: Clear to auscultation without wheezing, rhonchi or crackles. No increased work of breathing. Abdominal:  Non-distended, non-tender & soft. No organomegaly or masses appreciated. Normal bowel sounds heard. CNS: Alert and oriented. No focal deficits. Extremities: no edema, no cyanosis Pressure Injury 07/10/21 Coccyx Medial Unstageable - Full thickness tissue loss in which the base of the injury is covered by slough (yellow, tan, gray, green or brown) and/or eschar (tan, brown or black) in the wound bed. (Active)  07/10/21 0000  Location: Coccyx  Location Orientation: Medial  Staging: Unstageable - Full thickness tissue loss in which the base of the injury is covered by slough (yellow, tan, gray, green or brown) and/or eschar (tan, brown or black) in the wound bed.  Wound Description (Comments):   Present on Admission: Yes      The results of significant diagnostics from this hospitalization (including imaging, microbiology, ancillary and laboratory) are listed below for reference.      Microbiology:    Recent Results (from the past 240 hour(s))  Resp Panel by RT-PCR (Flu A&B, Covid) Nasopharyngeal Swab     Status: Abnormal   Collection Time: 07/09/21  5:41 PM   Specimen: Nasopharyngeal Swab; Nasopharyngeal(NP) swabs in vial transport medium  Result Value Ref Range  Status   SARS Coronavirus 2 by RT PCR POSITIVE (A) NEGATIVE Final    Comment: CRITICAL RESULT CALLED TO, READ BACK BY AND VERIFIED WITH: [place] 07/09/2021 COLEMAN,R (NOTE) SARS-CoV-2 target nucleic acids are DETECTED.  The SARS-CoV-2 RNA is generally detectable in upper respiratory specimens during the acute phase of infection. Positive results are indicative of the presence of the identified virus, but do not rule out bacterial infection or co-infection with other pathogens not detected by the test. Clinical correlation with patient history and other diagnostic information is necessary to determine patient infection status. The expected result is Negative.  Fact Sheet for Patients: EntrepreneurPulse.com.au  Fact Sheet for Healthcare Providers: IncredibleEmployment.be  This test is not yet approved or cleared by the [place] FDA and  has been authorized for detection and/or diagnosis of SARS-CoV-2 by FDA under an Emergency Use Authorization (EUA).  This EUA will remain in effect (meaning this t est can be used) for the duration of  the COVID-19 declaration under Section 564(b)(1) of the Act, 21 U.S.C. section 360bbb-3(b)(1), unless the authorization is terminated or revoked sooner.     Influenza A by PCR NEGATIVE NEGATIVE Final   Influenza B by PCR NEGATIVE NEGATIVE Final    Comment: (NOTE) The Xpert Xpress SARS-CoV-2/FLU/RSV plus assay is intended as an aid in the diagnosis of influenza from Nasopharyngeal swab specimens and should not be used as a sole basis for treatment. Nasal washings and aspirates are unacceptable for Xpert Xpress  SARS-CoV-2/FLU/RSV testing.  Fact Sheet for Patients: EntrepreneurPulse.com.au  Fact Sheet for Healthcare Providers: IncredibleEmployment.be  This test is not yet approved or cleared by the [place] FDA and has been authorized for detection and/or diagnosis of SARS-CoV-2 by FDA under an Emergency Use Authorization (EUA). This EUA will remain in effect (meaning this test can be used) for the duration of the COVID-19 declaration under Section 564(b)(1) of the Act, 21 U.S.C. section 360bbb-3(b)(1), unless the authorization is terminated or revoked.  Performed at [facility], [address], [place], [place] [address]   Culture, blood (routine x 2)     Status: None   Collection Time: 07/09/21  5:50 PM   Specimen: Right Antecubital; Blood  Result Value Ref Range Status   Specimen Description RIGHT ANTECUBITAL  Final   Special Requests   Final    BOTTLES DRAWN AEROBIC AND ANAEROBIC Blood Culture adequate volume   Culture   Final    NO GROWTH 5 DAYS Performed at [facility], [address], [place], [place] [address]    Report Status 07/14/2021 FINAL  Final  Culture, blood (routine x 2)     Status: None   Collection Time: 07/09/21  6:14 PM   Specimen: BLOOD RIGHT ARM  Result Value Ref Range Status   Specimen Description BLOOD RIGHT ARM  Final   Special Requests   Final    BOTTLES DRAWN AEROBIC ONLY Blood Culture adequate volume   Culture   Final    NO GROWTH 5 DAYS Performed at [facility], [address], [place], [place] [address]    Report Status 07/14/2021 FINAL  Final  MRSA Next Gen by PCR, Nasal     Status: None   Collection Time: 07/09/21 10:44 PM   Specimen: Nasal Mucosa; Nasal Swab  Result Value Ref Range Status   MRSA by PCR Next Gen NOT DETECTED NOT DETECTED Final    Comment: (NOTE) The GeneXpert MRSA Assay (FDA approved for NASAL specimens only), is one component of  a comprehensive MRSA colonization surveillance program.  It is not intended to diagnose MRSA infection nor to guide or monitor treatment for MRSA infections. Test performance is not FDA approved in patients less than 13 years old. Performed at [facility], [address] [place], [place] [address]      Labs:   CBC: Recent Labs  Lab 07/13/21 0300 07/14/21 0129 07/14/21 0827 07/15/21 0317 07/16/21 0410 07/17/21 0435 07/18/21 0516 07/19/21 0603  WBC 24.5* 23.0*   < > 30.5* 35.2* 30.0* 29.0* 18.6*  NEUTROABS 22.3* 20.9*  --  27.0*  --   --   --   --   HGB 8.2* 7.0*   < > 9.7* 10.6* 9.7* 10.7* 10.7*  HCT 25.4* 22.3*   < > 29.2* 32.3* 30.3* 33.3* 33.6*  MCV 87.3 88.1   < > 84.6 85.7 86.8 88.1 87.5  PLT 302 272   < > 299 297 265 270 199   < > = values in this interval not displayed.   Basic Metabolic Panel: Recent Labs  Lab 07/13/21 0300 07/13/21 0919 07/13/21 1612 07/14/21 0129 07/15/21 0317 07/16/21 0410 07/17/21 0435  NA 139   < > 142 135 139 140 138  K 2.8*   < > 3.2* 4.0 2.9* 3.4* 3.6  CL 115*   < > 110 107 106 105 107  CO2 21*   < > '24 23 26 27 27  '$ GLUCOSE 197*   < > 132* 283* 162* 196* 149*  BUN 14   < > '13 11 16 18 19  '$ CREATININE 0.48   < > 0.58 0.52 0.57 0.56 0.59  CALCIUM 6.4*   < > 8.8* 7.0* 7.8* 7.9* 7.9*  MG 1.7  --   --  2.1 2.0  --   --   PHOS 2.3*  --   --  4.4 2.5  --   --    < > = values in this interval not displayed.   Liver Function Tests: Recent Labs  Lab 07/13/21 0300 07/13/21 0919 07/14/21 0129 07/15/21 0317  AST '19 22 16 15  '$ ALT '16 19 12 15  '$ ALKPHOS 54 64 43 50  BILITOT 0.2* 0.3 0.2* 0.8  PROT 4.7* 5.5* 6.0* 4.8*  ALBUMIN 1.7* 2.0* 3.9 2.5*   BNP (last 3 results) Recent Labs    07/13/21 0300  BNP 169.0*   Cardiac Enzymes: No results for input(s): CKTOTAL, CKMB, CKMBINDEX, TROPONINI in the last 168 hours. CBG: Recent Labs  Lab 07/18/21 1148 07/18/21 1632 07/18/21 2139 07/19/21 0814 07/19/21 1132  GLUCAP 201* 188* 240* 94 117*   Hgb A1c No results for input(s): HGBA1C in  the last 72 hours. Lipid Profile No results for input(s): CHOL, HDL, LDLCALC, TRIG, CHOLHDL, LDLDIRECT in the last 72 hours. Thyroid function studies No results for input(s): TSH, T4TOTAL, T3FREE, THYROIDAB in the last 72 hours.  Invalid input(s): FREET3 Anemia work up No results for input(s): VITAMINB12, FOLATE, FERRITIN, TIBC, IRON, RETICCTPCT in the last 72 hours. Urinalysis    Component Value Date/Time   COLORURINE YELLOW 07/09/2021 1748   APPEARANCEUR HAZY (A) 07/09/2021 1748   LABSPEC 1.025 07/09/2021 1748   PHURINE 5.5 07/09/2021 1748   GLUCOSEU NEGATIVE 07/09/2021 1748   HGBUR NEGATIVE 07/09/2021 1748   BILIRUBINUR NEGATIVE 07/09/2021 1748   KETONESUR NEGATIVE 07/09/2021 1748   PROTEINUR TRACE (A) 07/09/2021 1748   UROBILINOGEN 0.2 07/13/2015 1024   NITRITE NEGATIVE 07/09/2021 1748   LEUKOCYTESUR NEGATIVE 07/09/2021 1748   Pressure Injury  07/10/21 Coccyx Medial Unstageable - Full thickness tissue loss in which the base of the injury is covered by slough (yellow, tan, gray, green or brown) and/or eschar (tan, brown or black) in the wound bed. (Active)  07/10/21 0000  Location: Coccyx  Location Orientation: Medial  Staging: Unstageable - Full thickness tissue loss in which the base of the injury is covered by slough (yellow, tan, gray, green or brown) and/or eschar (tan, brown or black) in the wound bed.  Wound Description (Comments):   Present on Admission: Yes       Time coordinating discharge: Over 45 minutes  SIGNED: Deatra James, MD, FACP, [facility]. [facility],  Please use [email or web address] to Page If 7PM-7AM, please contact night-coverage [email or web address], Password [facility] 07/19/2021, 1:05 PM

## 2021-07-19 NOTE — Evaluation (Signed)
Physical Therapy Evaluation Patient Details Name: Taylor Andrews MRN: [id number] DOB: 19-Apr-1934 Today's Date: 07/19/2021  History of Present Illness  CORALYNN NIPPLE is a 87 y.o. female with medical history significant for [place], hypertension, diabetes mellitus, subdural hematoma 2015.  Patient was brought to the ED from nursing home with reports of altered mental status and cough.  At the time of my evaluation, patient is somnolent, not answering questions.  Patient was brought to the ED with reports of cough, change in mental status.    I am unable to reach nursing home.  I talked to patient's niece who is power of attorney- [name], she reports patient has been sick since last Sunday-at least 1 week.  Has been weak, not eating, not wanting to get up or walk.  At baseline, patient can feed her self, occasionally recognizes family, but cannot answer questions.  Today patient started coughing, so she was brought to the ED.   Clinical Impression  Patient demonstrates slow labored movement for sitting up at bedside with poor carryover for attempting to prop up on elbows, once seated, unable to maintain sitting balance with constant falling over to the left and unable to attempt sit to stands due to fall risk.  Patient required Max assist to reposition when put back to bed.  Patient will benefit from continued physical therapy in hospital and recommended venue below to increase strength, balance, endurance for safe ADLs and gait.         Recommendations for follow up therapy are one component of a multi-disciplinary discharge planning process, led by the attending physician.  Recommendations may be updated based on patient status, additional functional criteria and insurance authorization.  Follow Up Recommendations SNF    Equipment Recommendations  None recommended by PT    Recommendations for Other Services       Precautions / Restrictions Precautions Precautions: Fall Restrictions Weight  Bearing Restrictions: No      Mobility  Bed Mobility Overal bed mobility: Needs Assistance Bed Mobility: Rolling;Sidelying to Sit;Sit to Sidelying Rolling: Mod assist;Max assist Sidelying to sit: Max assist     Sit to sidelying: Max assist General bed mobility comments: slow labored movement with poor carryover for attempting to prop up on elbows    Transfers                    Ambulation/Gait                Stairs            Wheelchair Mobility    Modified Rankin (Stroke Patients Only)       Balance Overall balance assessment: Needs assistance Sitting-balance support: Feet supported;No upper extremity supported Sitting balance-Leahy Scale: Poor Sitting balance - Comments: seated at EOB with frequent falling over to the left Postural control: Left lateral lean                                   Pertinent Vitals/Pain Pain Assessment: No/denies pain    Home Living Family/patient expects to be discharged to:: Assisted living               Home Equipment: Walker - 2 wheels Additional Comments: Patient is poor historian    Prior Function Level of Independence: Needs assistance   Gait / Transfers Assistance Needed: assisted household ambulation using RW  ADL's / Homemaking Assistance Needed: assisted by ALF staff  Comments: Patient is poor historian     Hand Dominance   Dominant Hand: Right    Extremity/Trunk Assessment   Upper Extremity Assessment Upper Extremity Assessment: Generalized weakness    Lower Extremity Assessment Lower Extremity Assessment: Generalized weakness    Cervical / Trunk Assessment Cervical / Trunk Assessment: Kyphotic  Communication   Communication: Expressive difficulties  Cognition Arousal/Alertness: Awake/alert Behavior During Therapy: Flat affect Overall Cognitive Status: History of cognitive impairments - at baseline                                         General Comments      Exercises     Assessment/Plan    PT Assessment Patient needs continued PT services  PT Problem List Decreased strength;Decreased activity tolerance;Decreased balance;Decreased mobility       PT Treatment Interventions DME instruction;Gait training;Stair training;Functional mobility training;Patient/family education;Therapeutic activities;Therapeutic exercise;Balance training    PT Goals (Current goals can be found in the Care Plan section)  Acute Rehab PT Goals Patient Stated Goal: not stated PT Goal Formulation: With patient Time For Goal Achievement: 08/02/21 Potential to Achieve Goals: Good    Frequency Min 3X/week   Barriers to discharge        Co-evaluation               AM-PAC PT "6 Clicks" Mobility  Outcome Measure Help needed turning from your back to your side while in a flat bed without using bedrails?: A Lot Help needed moving from lying on your back to sitting on the side of a flat bed without using bedrails?: A Lot Help needed moving to and from a bed to a chair (including a wheelchair)?: Total Help needed standing up from a chair using your arms (e.g., wheelchair or bedside chair)?: Total Help needed to walk in hospital room?: Total Help needed climbing 3-5 steps with a railing? : Total 6 Click Score: 8    End of Session   Activity Tolerance: Patient tolerated treatment well;Patient limited by fatigue Patient left: in bed;with call bell/phone within reach;with bed alarm set Nurse Communication: Mobility status PT Visit Diagnosis: Unsteadiness on feet (R26.81);Other abnormalities of gait and mobility (R26.89);Muscle weakness (generalized) (M62.81)    Time: [id number] PT Time Calculation (min) (ACUTE ONLY): 25 min   Charges:   PT Evaluation $PT Eval Moderate Complexity: 1 Mod PT Treatments $Therapeutic Activity: 23-37 mins        4:07 PM, 07/19/21 Lonell Grandchild, MPT Physical Therapist with [facility] [phone] office [phone] mobile phone

## 2021-07-19 NOTE — TOC Progression Note (Signed)
Transition of Care [facility]) - Progression Note    Patient Details  Name: Taylor Andrews MRN: [id number] Date of Birth: 28-Jun-1934  [facility]) CM/SW Contact  Boneta Lucks, RN Phone Number: 07/19/2021, 10:41 AM  Clinical Narrative:  TOC spoke with niece [name], she is accepting the bed offer at [facility].  Per Jackelyn Poling they can admit patient tomorrow, day 11 of COVID.  She requested PT note and INS AUTH since patient also has [employer]. If patient can regain her strength she could return to family care home if not they will give her a LTC bed. MD, PT updated. INS Auth will be started when PT note is complete.   Expected Discharge Plan: Long Term Acute Care (LTAC) Barriers to Discharge: Continued Medical Work up  Expected Discharge Plan and Services Expected Discharge Plan: [place] (LTAC)    [place]   Living arrangements for the past 2 months: [place]

## 2021-07-19 NOTE — Plan of Care (Deleted)
  Problem: Acute Rehab PT Goals(only PT should resolve) Goal: Pt Will Go Supine/Side To Sit Outcome: Progressing Flowsheets (Taken 07/19/2021 1608) Pt will go Supine/Side to Sit: with moderate assist Goal: Patient Will Transfer Sit To/From Stand Outcome: Progressing Flowsheets (Taken 07/19/2021 1608) Patient will transfer sit to/from stand: with moderate assist Goal: Pt Will Transfer Bed To Chair/Chair To Bed Outcome: Progressing Flowsheets (Taken 07/19/2021 1608) Pt will Transfer Bed to Chair/Chair to Bed:  with mod assist  with max assist Goal: Pt Will Ambulate Outcome: Progressing Flowsheets (Taken 07/19/2021 1608) Pt will Ambulate:  with moderate assist  with maximum assist  with rolling walker  10 feet   4:11 PM, 07/19/21 Lonell Grandchild, MPT Physical Therapist with [facility] [phone] office [phone] mobile phone

## 2021-07-20 ENCOUNTER — Other Ambulatory Visit: Payer: Self-pay

## 2021-07-20 DIAGNOSIS — D649 Anemia, unspecified: Secondary | ICD-10-CM | POA: Diagnosis not present

## 2021-07-20 DIAGNOSIS — Z7401 Bed confinement status: Secondary | ICD-10-CM | POA: Diagnosis not present

## 2021-07-20 DIAGNOSIS — Z794 Long term (current) use of insulin: Secondary | ICD-10-CM | POA: Diagnosis not present

## 2021-07-20 DIAGNOSIS — M4628 Osteomyelitis of vertebra, sacral and sacrococcygeal region: Secondary | ICD-10-CM | POA: Diagnosis not present

## 2021-07-20 DIAGNOSIS — M6281 Muscle weakness (generalized): Secondary | ICD-10-CM | POA: Diagnosis not present

## 2021-07-20 DIAGNOSIS — R627 Adult failure to thrive: Secondary | ICD-10-CM | POA: Diagnosis not present

## 2021-07-20 DIAGNOSIS — R404 Transient alteration of awareness: Secondary | ICD-10-CM | POA: Diagnosis not present

## 2021-07-20 DIAGNOSIS — L89154 Pressure ulcer of sacral region, stage 4: Secondary | ICD-10-CM | POA: Diagnosis not present

## 2021-07-20 DIAGNOSIS — Z7901 Long term (current) use of anticoagulants: Secondary | ICD-10-CM | POA: Diagnosis not present

## 2021-07-20 DIAGNOSIS — R279 Unspecified lack of coordination: Secondary | ICD-10-CM | POA: Diagnosis not present

## 2021-07-20 DIAGNOSIS — E119 Type 2 diabetes mellitus without complications: Secondary | ICD-10-CM | POA: Diagnosis not present

## 2021-07-20 DIAGNOSIS — I10 Essential (primary) hypertension: Secondary | ICD-10-CM | POA: Diagnosis not present

## 2021-07-20 DIAGNOSIS — U071 COVID-19: Secondary | ICD-10-CM | POA: Diagnosis not present

## 2021-07-20 DIAGNOSIS — I2601 Septic pulmonary embolism with acute cor pulmonale: Secondary | ICD-10-CM | POA: Diagnosis not present

## 2021-07-20 DIAGNOSIS — R2689 Other abnormalities of gait and mobility: Secondary | ICD-10-CM | POA: Diagnosis not present

## 2021-07-20 DIAGNOSIS — R6889 Other general symptoms and signs: Secondary | ICD-10-CM | POA: Diagnosis not present

## 2021-07-20 DIAGNOSIS — I82403 Acute embolism and thrombosis of unspecified deep veins of lower extremity, bilateral: Secondary | ICD-10-CM | POA: Diagnosis not present

## 2021-07-20 DIAGNOSIS — L03319 Cellulitis of trunk, unspecified: Secondary | ICD-10-CM | POA: Diagnosis not present

## 2021-07-20 DIAGNOSIS — I82402 Acute embolism and thrombosis of unspecified deep veins of left lower extremity: Secondary | ICD-10-CM | POA: Diagnosis not present

## 2021-07-20 DIAGNOSIS — I2692 Saddle embolus of pulmonary artery without acute cor pulmonale: Secondary | ICD-10-CM | POA: Diagnosis not present

## 2021-07-20 DIAGNOSIS — J9601 Acute respiratory failure with hypoxia: Secondary | ICD-10-CM | POA: Diagnosis not present

## 2021-07-20 DIAGNOSIS — R5381 Other malaise: Secondary | ICD-10-CM | POA: Diagnosis not present

## 2021-07-20 DIAGNOSIS — L89150 Pressure ulcer of sacral region, unstageable: Secondary | ICD-10-CM | POA: Diagnosis not present

## 2021-07-20 DIAGNOSIS — R1312 Dysphagia, oropharyngeal phase: Secondary | ICD-10-CM | POA: Diagnosis not present

## 2021-07-20 LAB — CBC
HCT: 36.4 % (ref 36.0–46.0)
Hemoglobin: 11.8 g/dL — ABNORMAL LOW (ref 12.0–15.0)
MCH: 28.2 pg (ref 26.0–34.0)
MCHC: 32.4 g/dL (ref 30.0–36.0)
MCV: 86.9 fL (ref 80.0–100.0)
Platelets: 246 10*3/uL (ref 150–400)
RBC: 4.19 MIL/uL (ref 3.87–5.11)
RDW: 18.9 % — ABNORMAL HIGH (ref 11.5–15.5)
WBC: 23.7 10*3/uL — ABNORMAL HIGH (ref 4.0–10.5)
nRBC: 0.1 % (ref 0.0–0.2)

## 2021-07-20 LAB — GLUCOSE, CAPILLARY
Glucose-Capillary: 97 mg/dL (ref 70–99)
Glucose-Capillary: 141 mg/dL — ABNORMAL HIGH (ref 70–99)
Glucose-Capillary: 197 mg/dL — ABNORMAL HIGH (ref 70–99)

## 2021-07-20 LAB — LACTIC ACID, PLASMA: Lactic Acid, Venous: 1.7 mmol/L (ref 0.5–1.9)

## 2021-07-20 LAB — PROCALCITONIN: Procalcitonin: 0.22 ng/mL

## 2021-07-20 MED ORDER — PREDNISONE 20 MG PO TABS
30.0000 mg | ORAL_TABLET | Freq: Every day | ORAL | Status: DC
  Administered 2021-07-20: 30 mg via ORAL

## 2021-07-20 MED ORDER — AZITHROMYCIN 500 MG PO TABS: 500.0000 mg | ORAL_TABLET | Freq: Every day | ORAL | 0 refills | Status: AC

## 2021-07-20 MED ORDER — AZITHROMYCIN 250 MG PO TABS
500.0000 mg | ORAL_TABLET | Freq: Every day | ORAL | Status: DC
  Administered 2021-07-20: 500 mg via ORAL
  Filled 2021-07-20: qty 2

## 2021-07-20 NOTE — Evaluation (Signed)
Occupational Therapy Evaluation Patient Details Name: Taylor Andrews MRN: [id number] DOB: 1934-04-07 Today's Date: 07/20/2021   History of Present Illness Taylor Andrews is a 87 y.o. female with medical history significant for Taylor Andrews, hypertension, diabetes mellitus, subdural hematoma 2015.  Patient was brought to the ED from nursing home with reports of altered mental status and cough.  At the time of my evaluation, patient is somnolent, not answering questions.  Patient was brought to the ED with reports of cough, change in mental status.    I am unable to reach nursing home.  I talked to patient's niece who is power of attorney- Taylor Andrews, she reports patient has been sick since last Sunday-at least 1 week.  Has been weak, not eating, not wanting to get up or walk.  At baseline, patient can feed her self, occasionally recognizes family, but cannot answer questions.  Today patient started coughing, so she was brought to the ED.   Clinical Impression   Pt demonstrates flat affect with minimal communication. Pt often mumbling during session. Pt requires max A for supine to sit and sit to supine mobility. Pt requires max A for sit to stand. Pt demonstrates minimal B UE functional use with tone throughout P/ROM. Pt will benefit from continued OT in the hospital and recommended venue below to increase strength, balance, and endurance for safe ADL's.        Recommendations for follow up therapy are one component of a multi-disciplinary discharge planning process, led by the attending physician.  Recommendations may be updated based on patient status, additional functional criteria and insurance authorization.   Follow Up Recommendations  SNF    Equipment Recommendations  None recommended by OT           Precautions / Restrictions Precautions Precautions: Fall Restrictions Weight Bearing Restrictions: No      Mobility Bed Mobility Overal bed mobility: Needs Assistance Bed Mobility: Supine to  Sit;Sit to Supine     Supine to sit: Max assist Sit to supine: Max assist   General bed mobility comments: slow labored movement    Transfers Overall transfer level: Needs assistance   Transfers: Sit to/from Stand Sit to Stand: Max assist         General transfer comment: Assisted maximally by therapists anteriorly    Balance Overall balance assessment: Needs assistance Sitting-balance support: Feet supported;No upper extremity supported Sitting balance-Leahy Scale: Fair Sitting balance - Comments: seated at EOB   Standing balance support: During functional activity;Bilateral upper extremity supported Standing balance-Leahy Scale: Poor Standing balance comment: with therapist assisting                           ADL either performed or assessed with clinical judgement   ADL Overall ADL's : Needs assistance/impaired                         Toilet Transfer: Maximal assistance;Stand-pivot Toilet Transfer Details (indicate cue type and reason): partially simulated via sit to stand with Max A                 Vision Baseline Vision/History: 0 No visual deficits (Pt reports no deficits) Ability to See in Adequate Light: 0 Adequate                  Pertinent Vitals/Pain Pain Assessment: Faces Pain Score: 0-No pain     Hand Dominance Right   Extremity/Trunk Assessment  Upper Extremity Assessment Upper Extremity Assessment: RUE deficits/detail;LUE deficits/detail RUE Deficits / Details: 2 on modiefied ashworth scale for shoulder and elbow; 3/5 MMT grip. WFL P/ROM of shoulder and elbow but limited A/ROM noted. Difficult to fully assess due to pt cognitive status. Limited flexion and extenson of wrist to ~ 50 to 75% of typical range. RUE Coordination: decreased fine motor;decreased gross motor LUE Deficits / Details: 2 on modiefied ashworth scale for shoulder and elbow; 3/5 MMT grip. WFL P/ROM of shoulder and elbow but limited A/ROM noted.  Difficult to fully assess due to pt cognitive status. Limited flexion and extenson of wrist to ~ 50 to 75% of typical range. LUE Coordination: decreased fine motor;decreased gross motor   Lower Extremity Assessment Lower Extremity Assessment: Defer to PT evaluation   Cervical / Trunk Assessment Cervical / Trunk Assessment: Kyphotic   Communication Communication Communication: Expressive difficulties   Cognition Arousal/Alertness: Awake/alert Behavior During Therapy: Flat affect Overall Cognitive Status: History of cognitive impairments - at baseline                                                      Home Living Family/patient expects to be discharged to:: Assisted living                             Home Equipment: Walker - 2 wheels   Additional Comments: Patient is poor historian (History taken from document review)      Prior Functioning/Environment Level of Independence: Needs assistance  Gait / Transfers Assistance Needed: assisted household ambulation using RW ADL's / Homemaking Assistance Needed: assisted by ALF staff Communication / Swallowing Assistance Needed: History taken from document review          OT Problem List: Decreased strength;Decreased range of motion;Decreased activity tolerance;Impaired balance (sitting and/or standing);Decreased coordination;Decreased safety awareness;Impaired UE functional use      OT Treatment/Interventions: Self-care/ADL training;Therapeutic exercise;Patient/family education;Balance training;Therapeutic activities;Manual therapy;Cognitive remediation/compensation    OT Goals(Current goals can be found in the care plan section) Acute Rehab OT Goals Patient Stated Goal: not stated OT Goal Formulation: Patient unable to participate in goal setting Time For Goal Achievement: 08/03/21 Potential to Achieve Goals: Fair  OT Frequency: Min 2X/week    End of Session Nurse Communication: Other  (comment) (Nurse observed session.)  Activity Tolerance: Patient tolerated treatment well Patient left: in bed;with call bell/phone within reach;with family/visitor present  OT Visit Diagnosis: Repeated falls (R29.6);Unsteadiness on feet (R26.81);Muscle weakness (generalized) (M62.81)                Time: [id number] OT Time Calculation (min): 16 min Charges:  OT General Charges $OT Visit: 1 Visit OT Evaluation $OT Eval Moderate Complexity: 1 Mod  Taylor Andrews OT, MOT  Larey Seat 07/20/2021, 10:08 AM

## 2021-07-20 NOTE — Progress Notes (Signed)
Called report to [employer] RN.

## 2021-07-20 NOTE — TOC Transition Note (Signed)
Transition of Care [facility]) - CM/SW Discharge Note   Patient Details  Name: Taylor Andrews MRN: [id number] Date of Birth: July 09, 1934  [facility] of Care [facility]) CM/SW Contact:  Natasha Bence, LCSW Phone Number: 07/20/2021, 2:07 PM   Clinical Narrative:    CSW notified of patient's readiness for discharge. Debbie agreeable to take patient. CSW contacted EMS and completed med necessity. Nurse to call report. TOC signing off.    Final next level of care: Skilled Nursing Facility Barriers to Discharge: Barriers Resolved   Patient Goals and CMS Choice Patient states their goals for this hospitalization and ongoing recovery are:: Rehab with SNF CMS Medicare.gov Compare Post Acute Care list provided to:: Patient Choice offered to / list presented to : Patient  Discharge Placement              Patient [facility] bed at: [facility] Patient to be transferred to facility by: [facility] Name of family member notified: Adams,[name] (Niece)   [phone] Patient and family notified of of transfer: 07/20/21  Discharge Plan and Services                                     Social Determinants of Health (SDOH) Interventions     Readmission Risk Interventions Readmission Risk Prevention Plan 07/18/2021  Transportation Screening Complete  HRI or [place] Complete  Social Work Consult for [place] Planning/Counseling Complete  Palliative Care Screening Not Applicable  Medication Review Press photographer) Complete  Some recent data might be hidden

## 2021-07-20 NOTE — Discharge Summary (Addendum)
Physician Discharge Summary [facility]    Patient: Taylor Andrews                   Admit date: 07/09/2021   DOB: October 06, 1934             Discharge date:07/20/2021/10:52 AM [id number]                          PCP: Redmond School, MD  Disposition:  SNF   Recommendations for Outpatient Follow-up:   Follow up: PCP within 1-2 weeks Follow-up with primary provider at SNF  Discharge Condition: Stable   Code Status:   Code Status: DNR  Diet recommendation: Regular healthy diet   The patient was seen and examined this morning, remained stable. Off isolation regarding COVID infection, day 11.    Discharge Diagnoses:    Principal Problem:   Acute Pulmonary Embolus/DVT Active Problems:   Acute encephalopathy   Dementia (HCC)   HTN (hypertension)   DM (diabetes mellitus), type 2 ([place])   COVID-19 virus infection   Severe sepsis ([place])   AMS (altered mental status)   Decubitus ulcer of sacral region, unstageable ([place])   Sacral osteomyelitis ([place])   Cellulitis of sacral region   Left leg DVT ([place])   History of Present Illness/ Hospital Course Taylor Andrews Summary:    87 y.o. BF PMHx dementia,, hypertension, DM type II controlled, fatty liver disease,  subdural hematoma 2015 came from nursing home for altered mental status and cough.  Admitted 3 weeks ago for dehydration with possible sepsis initially on vancomycin and cefepime, MRI brain was unremarkable, cultures were negative and was discharged on antibiotics.  Admitted to the hospital for sepsis secondary to [id number] infection complicated by pulmonary embolism and Bil DVT. She was started on Heparin drip, transition to p.o. Eliquis    1)Severe Sepsis Secondary to cellulitis/infected sacral decubitus ulcer. -X-rays without evidence of osteomyelitis -Procalcitonin was elevated - PICC line- 07/12/21 -She completed Vancomycin x 5 days  - Completed Cipro for total of 9 days ---last dose 07/18/21 -Leukocytosis persist due to  steroids otherwise sepsis pathophysiology appears to be resolving-   2)Acute Hypoxic Respiratory failure secondary to COVID-19 infection/Pneumonia---   -Completed IV remdesivir Treated with IV Solu-Medrol switched to Prednisone on 07/18/2022 ----Attempt to maintain euvolemic state --Zinc and vitamin C  -Albuterol inhaler as needed -Accu-Cheks/fingersticks while on high-dose steroids - I-S/flutter valve, bronchodilators, vitamins -Hypoxia has resolved   3)Right large pulmonary embolism with cor pulmonale, submassive PE Bilateral lower extremity DVT -- Lower extremity Dopplers-positive for bilateral DVT - Echocardiogram - EF 65%  - Given extensive clot burden we continued IV heparin for up to 5 days prior to switching to p.o. Eliquis on 07/16/21 -Plan to discharge on Eliquis for 6 months -Patient was weaned off oxygen   4) Unstageable sacral decubitus ulcer Pressure Injury 07/10/21 Coccyx Medial Unstageable - Full thickness tissue loss in which the base of the injury is covered by slough (yellow, tan, gray, green or brown) and/or eschar (tan, brown or black) in the wound bed. (Active)  07/10/21 0000  Location: Coccyx  Location Orientation: Medial  Staging: Unstageable - Full thickness tissue loss in which the base of the injury is covered by slough (yellow, tan, gray, green or brown) and/or eschar (tan, brown or black) in the wound bed.  Wound Description (Comments):   Present on Admission: Yes  -Antibiotics as above in #1   5)Acute metabolic encephalopathy-superimposed on  underlying dementia--most likely due to #1 #2 #3 above -- At baseline can feed herself and occasionally recognize family members but unable to answer all questions.   -Currently unable to do these things---patient is currently Total care with ADLs - CT head negative -c/n Namenda and Aricept   6)Essential hypertension -Blood pressure stable without PTA BP meds -Continue amlodipine -discontinued HCTZ  8)Diabetes  mellitus type 2 -A1c 5.2 reflecting excellent diabetic control PTA -Current hyperglycemia most likely due to steroids for COVID infection -Sliding scale insulin adjusted downward   9)Social/ethics--palliative care consult appreciated patient is a DNR   10)FTT- Nutritional status Nutrition Problem: Increased nutrient needs Etiology: wound healing Signs/Symptoms: estimated needs Interventions: Ensure Enlive (each supplement provides 350kcal and 20 grams of protein), MVI, Magic cup Body mass index is 19.45 kg/m.   11)H/o subdural hematoma 2015----monitor closely while on anticoagulation   12) acute on chronic symptomatic anemia--- hemoglobin is stable currently- above 10 after transfusion of PRBC on 07/14/2021  watch closely especially while on anticoagulation,    13)Disposition--patient was admitted from [facility] family care home-- Family has agreed to SNF,   11 days out from a positive COVID test    Code Status: DNR Family Communication:  her niece  Taylor Andrews is Primary Contact-  Discussed with HCPOA on 9/11/20222 [phone])     Nutritional status:  Nutrition Problem: Increased nutrient needs Etiology: wound healing Signs/Symptoms: estimated needs Interventions: Ensure Enlive (each supplement provides 350kcal and 20 grams of protein), MVI, Magic cup  The patient's BMI is: Body mass index is 19.45 kg/m. I agree with the assessment and plan as outlined below:  SKIN assessment: I agree with skin assessment and plan as outlined below: Pressure Injury 07/10/21 Coccyx Medial Unstageable - Full thickness tissue loss in which the base of the injury is covered by slough (yellow, tan, gray, green or brown) and/or eschar (tan, brown or black) in the wound bed. (Active)  07/10/21 0000  Location: Coccyx  Location Orientation: Medial  Staging: Unstageable - Full thickness tissue loss in which the base of the injury is covered by slough (yellow, tan, gray, green or brown)  and/or eschar (tan, brown or black) in the wound bed.  Wound Description (Comments):   Present on Admission: Yes    Risk of unplanned readmission Score:     Discharge Instructions:   Discharge Instructions     Activity as tolerated - No restrictions   Complete by: As directed    Diet - low sodium heart healthy   Complete by: As directed    Discharge instructions   Complete by: As directed    F/up at SNF ...   Discharge wound care:   Complete by: As directed    Per wound care nurse instructions Recommending consulting wound care at the facility   Increase activity slowly   Complete by: As directed         Medication List     STOP taking these medications    amLODipine 10 MG tablet Commonly known as: NORVASC   hydrochlorothiazide 25 MG tablet Commonly known as: HYDRODIURIL   LORazepam 0.5 MG tablet Commonly known as: ATIVAN   niacin 500 MG CR tablet Commonly known as: NIASPAN       TAKE these medications    apixaban 5 MG Tabs tablet Commonly known as: ELIQUIS Take 2 tablets (10 mg total) by mouth 2 (two) times daily for 3 days.   apixaban 5 MG Tabs tablet Commonly known as: ELIQUIS Take  1 tablet (5 mg total) by mouth 2 (two) times daily. Start taking on: July 23, 2021   azithromycin 500 MG tablet Commonly known as: Zithromax Take 1 tablet (500 mg total) by mouth daily for 4 days. Take 1 tablet daily for 3 days.   dextromethorphan-guaiFENesin 30-600 MG 12hr tablet Commonly known as: MUCINEX DM Take 1 tablet by mouth 2 (two) times daily as needed for up to 10 days for cough.   docusate sodium 100 MG capsule Commonly known as: COLACE Take 100 mg by mouth 3 (three) times daily.   donepezil 10 MG tablet Commonly known as: ARICEPT Take 10 mg by mouth daily.   Fish Oil 1000 MG Caps Take 1 capsule by mouth 2 (two) times daily with a meal.   insulin aspart 100 UNIT/ML injection Commonly known as: novoLOG Inject 0-6 Units into the skin 3  (three) times daily with meals.   insulin aspart 100 UNIT/ML injection Commonly known as: novoLOG Inject 0-5 Units into the skin at bedtime.   memantine 10 MG tablet Commonly known as: NAMENDA Take 10 mg by mouth 2 (two) times daily.   multivitamin-lutein Caps capsule Take 1 capsule by mouth daily.   oxybutynin 5 MG tablet Commonly known as: DITROPAN Take 5 mg by mouth 2 (two) times daily.   potassium chloride SA 20 MEQ tablet Commonly known as: KLOR-CON Take 20 mEq by mouth daily.   Probiotic Blend Caps Take 1 capsule by mouth daily.        Contact information for after-discharge care     [facility] .   Service: Skilled Nursing Contact information: [place] [phone]                    Allergies  Allergen Reactions   Milk-Related Compounds Other (See Comments)    Lactose Intolerant    Iohexol Other (See Comments)     Desc: PT STATES THAT THE CONTRAST THAT SHE HAD IN 2004 MADE HER SICK, PT COULD NOT SPECIFY REACTION TYPES      Procedures /Studies:   DG Sacrum/Coccyx  Result Date: 07/10/2021 CLINICAL DATA:  Pressure ulcer at sacrum with yellowish discharge, dimension EXAM: SACRUM AND COCCYX - 2+ VIEW COMPARISON:  04/05/2009 FINDINGS: SI joints and sacral foramina preserved. Diffuse osseous demineralization. Dressing artifacts dorsal to sacrum. No sacrococcygeal fracture or definite bone destruction identified though the dorsal margin of the distal sacrum is very near the skin surface on lateral view. IMPRESSION: No definite bone destruction is seen to suggest sacrococcygeal osteomyelitis. Electronically Signed   By: Lavonia Dana M.D.   On: 07/10/2021 13:43   CT Head Wo Contrast  Result Date: 07/09/2021 CLINICAL DATA:  Mental status changes, history diabetes mellitus, hypertension, dementia; prior history of subdural hematoma and craniotomy EXAM: CT HEAD WITHOUT  CONTRAST TECHNIQUE: Contiguous axial images were obtained from the base of the skull through the vertex without intravenous contrast. Sagittal and coronal MPR images reconstructed from axial data set. COMPARISON:  06/18/2021 FINDINGS: Brain: Generalized atrophy. Normal ventricular morphology. No midline shift or mass effect. Small vessel chronic ischemic changes of deep cerebral white matter. No intracranial hemorrhage, mass lesion, or evidence of acute infarction. Vascular: No hyperdense vessels Skull: Prior RIGHT frontal craniotomy.  Skull otherwise intact. Sinuses/Orbits: Air-fluid levels and mucus within LEFT maxillary and RIGHT sphenoid sinuses. Remaining paranasal sinuses mastoid air cells clear. Other: N/A IMPRESSION: Atrophy with small vessel chronic ischemic  changes of deep cerebral white matter. No acute intracranial abnormalities. Prior RIGHT frontal craniotomy. Sphenoid and LEFT maxillary sinus disease. Electronically Signed   By: Lavonia Dana M.D.   On: 07/09/2021 17:25   CT Angio Chest Pulmonary Embolism (PE) W or WO Contrast  Result Date: 07/11/2021 CLINICAL DATA:  Elevated D-dimer.  COVID-19. EXAM: CT ANGIOGRAPHY CHEST WITH CONTRAST TECHNIQUE: Multidetector CT imaging of the chest was performed using the standard protocol during bolus administration of intravenous contrast. Multiplanar CT image reconstructions and MIPs were obtained to evaluate the vascular anatomy. CONTRAST:  26m OMNIPAQUE IOHEXOL 350 MG/ML SOLN COMPARISON:  May 27, 2012. FINDINGS: Cardiovascular: Large linear filling defect is noted in distal right pulmonary artery which extends into the upper and lower lobe branches consistent with acute pulmonary embolus. RV/LV ratio of 1.4 is noted consistent with right heart strain. Pericardial effusion is noted. Atherosclerosis of thoracic aorta is noted without aneurysm or dissection. Mediastinum/Nodes: No enlarged mediastinal, hilar, or axillary lymph nodes. Thyroid gland, trachea, and  esophagus demonstrate no significant findings. Lungs/Pleura: No pneumothorax or pleural effusion is noted. Minimal bilateral posterior basilar subsegmental atelectasis is noted. Multiple focal airspace opacities are noted in the right upper lobe which may represent multifocal pneumonia. Upper Abdomen: No acute abnormality. Musculoskeletal: No chest wall abnormality. No acute or significant osseous findings. Review of the MIP images confirms the above findings. IMPRESSION: Large right-sided pulmonary embolus is noted which extends into upper and lower lobe branches. Positive for acute PE with CT evidence of right heart strain (RV/LV Ratio = 1.4) consistent with at least submassive (intermediate risk) PE. The presence of right heart strain has been associated with an increased risk of morbidity and mortality. Please refer to the "PE Focused" order set in EPIC. Critical Value/emergent results were called by telephone at the time of interpretation on 07/11/2021 at 2:35 pm to [facility], who verbally acknowledged these results. Multiple small focal airspace opacities are noted in the right upper lobe consistent with multifocal pneumonia. Aortic Atherosclerosis (ICD10-I70.0). Electronically Signed   By: JMarijo ConceptionM.D.   On: 07/11/2021 14:35   [place]ous Img Lower Bilateral (DVT)  Result Date: 07/11/2021 CLINICAL DATA:  Elevated D-dimer EXAM: BILATERAL LOWER EXTREMITY VENOUS DOPPLER ULTRASOUND TECHNIQUE: Gray-scale sonography with graded compression, as well as color Doppler and duplex ultrasound were performed to evaluate the lower extremity deep venous systems from the level of the common femoral vein and including the common femoral, femoral, profunda femoral, popliteal and calf veins including the posterior tibial, peroneal and gastrocnemius veins when visible. The superficial great saphenous vein was also interrogated. Spectral Doppler was utilized to evaluate flow at rest and with distal  augmentation maneuvers in the common femoral, femoral and popliteal veins. COMPARISON:  None. FINDINGS: RIGHT LOWER EXTREMITY Common Femoral Vein: No evidence of thrombus. Normal compressibility, respiratory phasicity and response to augmentation. Saphenofemoral Junction: No evidence of thrombus. Normal compressibility and flow on color Doppler imaging. Profunda Femoral Vein: No evidence of thrombus. Normal compressibility and flow on color Doppler imaging. Femoral Vein: No evidence of thrombus. Normal compressibility, respiratory phasicity and response to augmentation. Popliteal Vein: No evidence of thrombus. Normal compressibility, respiratory phasicity and response to augmentation. Calf Veins: No evidence of thrombus. Normal compressibility and flow on color Doppler imaging. LEFT LOWER EXTREMITY Common Femoral Vein: No evidence of thrombus. Normal compressibility and color Doppler flow. Saphenofemoral Junction: No evidence of thrombus. Normal compressibility and flow on color Doppler imaging. Profunda Femoral Vein: No evidence of  thrombus. Normal compressibility and flow on color Doppler imaging. Femoral Vein: Normal compressibility in the proximal and mid femoral vein. Incomplete compressibility of the distal left femoral vein. Wall thickening involving the mid and distal femoral vein. There is flow within the mid and distal femoral vein. Popliteal Vein: Incomplete compressibility in the left popliteal vein with wall thickening. Color Doppler flow in the left popliteal vein. Calf Veins: Limited evaluation. Other Findings:  None. IMPRESSION: 1. Age-indeterminate thrombus in the left lower extremity, involving the left femoral vein and left popliteal vein. Findings are suggestive for chronic DVT rather than acute DVT. 2.  Negative for deep venous thrombosis in right lower extremity. These results will be called to the ordering clinician or representative by the Radiologist Assistant, and communication documented  in the PACS or [employer]. Electronically Signed   By: Markus Daft M.D.   On: 07/11/2021 12:49   DG Chest Port 1 View  Result Date: 07/09/2021 CLINICAL DATA:  Cough and weakness.  Altered mental status. EXAM: PORTABLE CHEST 1 VIEW COMPARISON:  06/18/2021 FINDINGS: The cardiomediastinal contours are normal. Atherosclerosis of the aorta. Pulmonary vasculature is normal. No consolidation, pleural effusion, or pneumothorax. Diffuse bony under mineralization. No acute osseous abnormalities are seen. IMPRESSION: No acute chest findings. Electronically Signed   By: Keith Rake M.D.   On: 07/09/2021 17:47   ECHOCARDIOGRAM COMPLETE  Result Date: 07/12/2021    ECHOCARDIOGRAM REPORT   Patient Name:   SHERRINE BOWLEN Date of Exam: 07/12/2021 Medical Rec #:  [id number]    Height:       61.0 in Accession #:    [id number]   Weight:       95.7 lb Date of Birth:  08-05-1934   BSA:          1.381 m Patient Age:    29 years     BP:           153/38 mmHg Patient Gender: F            HR:           67 bpm. Exam Location:  [facility] Procedure: 2D Echo, Cardiac Doppler and Color Doppler Indications:    CHF-Acute Systolic [id number]  History:        Patient has no prior history of Echocardiogram examinations.                 Risk Factors:Hypertension and Dyslipidemia. COVID-19 virus                 infection. Dementia ([place]). Severe sepsis ([place]).  Sonographer:    Alvino Chapel RCS Referring Phys: [id number] [place]  Sonographer Comments: Image acquisition challenging due to patient behavioral factors. IMPRESSIONS  1. Left ventricular ejection fraction, by estimation, is 65 to 70%. The left ventricle has normal function. The left ventricle has no regional wall motion abnormalities. Left ventricular diastolic parameters are consistent with Grade I diastolic dysfunction (impaired relaxation).  2. Right ventricular systolic function is normal. The right ventricular size is normal. There is normal pulmonary artery systolic pressure.   3. The mitral valve is normal in structure. No evidence of mitral valve regurgitation. No evidence of mitral stenosis.  4. The aortic valve has an indeterminant number of cusps. There is moderate calcification of the aortic valve. There is moderate thickening of the aortic valve. Aortic valve regurgitation is moderate. No aortic stenosis is present.  5. The inferior vena cava is normal in size with greater than  50% respiratory variability, suggesting right atrial pressure of 3 mmHg. FINDINGS  Left Ventricle: Left ventricular ejection fraction, by estimation, is 65 to 70%. The left ventricle has normal function. The left ventricle has no regional wall motion abnormalities. The left ventricular internal cavity size was normal in size. There is  no left ventricular hypertrophy. Left ventricular diastolic parameters are consistent with Grade I diastolic dysfunction (impaired relaxation). Normal left ventricular filling pressure. Right Ventricle: The right ventricular size is normal. No increase in right ventricular wall thickness. Right ventricular systolic function is normal. There is normal pulmonary artery systolic pressure. The tricuspid regurgitant velocity is 2.55 m/s, and  with an assumed right atrial pressure of 3 mmHg, the estimated right ventricular systolic pressure is 0000000 mmHg. Left Atrium: Left atrial size was normal in size. Right Atrium: Right atrial size was normal in size. Pericardium: There is no evidence of pericardial effusion. Mitral Valve: The mitral valve is normal in structure. No evidence of mitral valve regurgitation. No evidence of mitral valve stenosis. Tricuspid Valve: The tricuspid valve is normal in structure. Tricuspid valve regurgitation is mild . No evidence of tricuspid stenosis. Aortic Valve: The aortic valve has an indeterminant number of cusps. There is moderate calcification of the aortic valve. There is moderate thickening of the aortic valve. There is moderate aortic valve  annular calcification. Aortic valve regurgitation is moderate. Aortic regurgitation PHT measures 295 msec. No aortic stenosis is present. Aortic valve mean gradient measures 6.5 mmHg. Aortic valve peak gradient measures 15.6 mmHg. Aortic valve area, by VTI measures 1.53 cm. Pulmonic Valve: The pulmonic valve was not well visualized. Pulmonic valve regurgitation is not visualized. No evidence of pulmonic stenosis. Aorta: The aortic root is normal in size and structure. Venous: The inferior vena cava is normal in size with greater than 50% respiratory variability, suggesting right atrial pressure of 3 mmHg. IAS/Shunts: No atrial level shunt detected by color flow Doppler.  LEFT VENTRICLE PLAX 2D LVIDd:         3.60 cm  Diastology LVIDs:         1.80 cm  LV e' medial:    7.72 cm/s LV PW:         0.90 cm  LV E/e' medial:  12.3 LV IVS:        1.10 cm  LV e' lateral:   8.38 cm/s LVOT diam:     1.90 cm  LV E/e' lateral: 11.3 LV SV:         63 LV SV Index:   46 LVOT Area:     2.84 cm  RIGHT VENTRICLE RV S prime:     21.60 cm/s TAPSE (M-mode): 2.0 cm LEFT ATRIUM             Index       RIGHT ATRIUM          Index LA diam:        2.10 cm 1.52 cm/m  RA Area:     9.76 cm LA Vol (A2C):   17.5 ml 12.68 ml/m RA Volume:   21.70 ml 15.72 ml/m LA Vol (A4C):   36.5 ml 26.44 ml/m LA Biplane Vol: 26.6 ml 19.27 ml/m  AORTIC VALVE AV Area (Vmax):    1.39 cm AV Area (Vmean):   1.31 cm AV Area (VTI):     1.53 cm AV Vmax:           197.63 cm/s AV Vmean:  114.059 cm/s AV VTI:            0.414 m AV Peak Grad:      15.6 mmHg AV Mean Grad:      6.5 mmHg LVOT Vmax:         96.80 cm/s LVOT Vmean:        52.900 cm/s LVOT VTI:          0.223 m LVOT/AV VTI ratio: 0.54 AI PHT:            295 msec  AORTA Ao Root diam: 2.80 cm MITRAL VALVE                TRICUSPID VALVE MV Area (PHT): 2.47 cm     TR Peak grad:   26.0 mmHg MV Decel Time: 307 msec     TR Vmax:        255.00 cm/s MV E velocity: 95.10 cm/s MV A velocity: 113.00 cm/s   SHUNTS MV E/A ratio:  0.84         Systemic VTI:  0.22 m                             Systemic Diam: 1.90 cm Carlyle Dolly MD Electronically signed by Carlyle Dolly MD Signature Date/Time: 07/12/2021/3:52:08 PM    Final    [place] EKG SITE RITE  Result Date: 07/11/2021 If Site Rite image not attached, placement could not be confirmed due to current cardiac rhythm.   Subjective:   Patient was seen and examined 07/20/2021, 10:52 AM Patient stable today. No acute distress.  No issues overnight Stable for discharge.  Discharge Exam:    Vitals:   07/19/21 0450 07/19/21 1426 07/19/21 2100 07/20/21 0325  BP: (!) 149/47 119/77 (!) 128/43 (!) 142/51  Pulse: 67 76 73 72  Resp: '18 16 20 13  '$ Temp: 98.1 F (36.7 C)  99.2 F (37.3 C) 97.8 F (36.6 C)  TempSrc: Oral  Axillary Oral  SpO2: 100% 100% 100% 100%  Weight:      Height:        General: Pt lying comfortably in bed & appears in no obvious distress. Cardiovascular: S1 & S2 heard, RRR, S1/S2 +. No murmurs, rubs, gallops or clicks. No JVD or pedal edema. Respiratory: Clear to auscultation without wheezing, rhonchi or crackles. No increased work of breathing. Abdominal:  Non-distended, non-tender & soft. No organomegaly or masses appreciated. Normal bowel sounds heard. CNS: Alert and oriented. No focal deficits. Extremities: no edema, no cyanosis Pressure Injury 07/10/21 Coccyx Medial Unstageable - Full thickness tissue loss in which the base of the injury is covered by slough (yellow, tan, gray, green or brown) and/or eschar (tan, brown or black) in the wound bed. (Active)  07/10/21 0000  Location: Coccyx  Location Orientation: Medial  Staging: Unstageable - Full thickness tissue loss in which the base of the injury is covered by slough (yellow, tan, gray, green or brown) and/or eschar (tan, brown or black) in the wound bed.  Wound Description (Comments):   Present on Admission: Yes      The results of significant diagnostics from this  hospitalization (including imaging, microbiology, ancillary and laboratory) are listed below for reference.      Microbiology:   No results found for this or any previous visit (from the past 240 hour(s)).    Labs:   CBC: Recent Labs  Lab 07/14/21 0129 07/14/21 0827 07/15/21 0317 07/16/21 0410 07/17/21  [id number] 07/18/21 0516 07/19/21 0603 07/20/21 0533  WBC 23.0*   < > 30.5* 35.2* 30.0* 29.0* 18.6* 23.7*  NEUTROABS 20.9*  --  27.0*  --   --   --   --   --   HGB 7.0*   < > 9.7* 10.6* 9.7* 10.7* 10.7* 11.8*  HCT 22.3*   < > 29.2* 32.3* 30.3* 33.3* 33.6* 36.4  MCV 88.1   < > 84.6 85.7 86.8 88.1 87.5 86.9  PLT 272   < > 299 297 265 270 199 246   < > = values in this interval not displayed.   Basic Metabolic Panel: Recent Labs  Lab 07/13/21 1612 07/14/21 0129 07/15/21 0317 07/16/21 0410 07/17/21 0435  NA 142 135 139 140 138  K 3.2* 4.0 2.9* 3.4* 3.6  CL 110 107 106 105 107  CO2 '24 23 26 27 27  '$ GLUCOSE 132* 283* 162* 196* 149*  BUN '13 11 16 18 19  '$ CREATININE 0.58 0.52 0.57 0.56 0.59  CALCIUM 8.8* 7.0* 7.8* 7.9* 7.9*  MG  --  2.1 2.0  --   --   PHOS  --  4.4 2.5  --   --    Liver Function Tests: Recent Labs  Lab 07/14/21 0129 07/15/21 0317  AST 16 15  ALT 12 15  ALKPHOS 43 50  BILITOT 0.2* 0.8  PROT 6.0* 4.8*  ALBUMIN 3.9 2.5*   BNP (last 3 results) Recent Labs    07/13/21 0300  BNP 169.0*   Cardiac Enzymes: No results for input(s): CKTOTAL, CKMB, CKMBINDEX, TROPONINI in the last 168 hours. CBG: Recent Labs  Lab 07/19/21 0814 07/19/21 1132 07/19/21 1649 07/19/21 2223 07/20/21 0810  GLUCAP 94 117* 246* 183* 97   Hgb A1c No results for input(s): HGBA1C in the last 72 hours. Lipid Profile No results for input(s): CHOL, HDL, LDLCALC, TRIG, CHOLHDL, LDLDIRECT in the last 72 hours. Thyroid function studies No results for input(s): TSH, T4TOTAL, T3FREE, THYROIDAB in the last 72 hours.  Invalid input(s): FREET3 Anemia work up No results for  input(s): VITAMINB12, FOLATE, FERRITIN, TIBC, IRON, RETICCTPCT in the last 72 hours. Urinalysis    Component Value Date/Time   COLORURINE YELLOW 07/09/2021 1748   APPEARANCEUR HAZY (A) 07/09/2021 1748   LABSPEC 1.025 07/09/2021 1748   PHURINE 5.5 07/09/2021 1748   GLUCOSEU NEGATIVE 07/09/2021 1748   HGBUR NEGATIVE 07/09/2021 1748   BILIRUBINUR NEGATIVE 07/09/2021 1748   KETONESUR NEGATIVE 07/09/2021 1748   PROTEINUR TRACE (A) 07/09/2021 1748   UROBILINOGEN 0.2 07/13/2015 1024   NITRITE NEGATIVE 07/09/2021 1748   LEUKOCYTESUR NEGATIVE 07/09/2021 1748   Pressure Injury 07/10/21 Coccyx Medial Unstageable - Full thickness tissue loss in which the base of the injury is covered by slough (yellow, tan, gray, green or brown) and/or eschar (tan, brown or black) in the wound bed. (Active)  07/10/21 0000  Location: Coccyx  Location Orientation: Medial  Staging: Unstageable - Full thickness tissue loss in which the base of the injury is covered by slough (yellow, tan, gray, green or brown) and/or eschar (tan, brown or black) in the wound bed.  Wound Description (Comments):   Present on Admission: Yes       Time coordinating discharge: Over 45 minutes  SIGNED: Deatra James, MD, FACP, [facility],  Please use [email or web address] to Page If 7PM-7AM, please contact night-coverage [email or web address] Ota [facility] 07/20/2021, 10:52 AM

## 2021-07-20 NOTE — Care Management Important Message (Signed)
Important Message  Patient Details  Name: Taylor Andrews MRN: [id number] Date of Birth: [birth year]   Medicare Important Message Given:  Yes     Tommy Medal 07/20/2021, 2:08 PM

## 2021-07-20 NOTE — Plan of Care (Signed)
  Problem: Acute Rehab OT Goals (only OT should resolve) Goal: Pt. Will Perform Grooming Flowsheets (Taken 07/20/2021 1009) Pt Will Perform Grooming:  with mod assist  sitting  with adaptive equipment Goal: Pt. Will Perform Upper Body Dressing Flowsheets (Taken 07/20/2021 1009) Pt Will Perform Upper Body Dressing:  with min assist  sitting Goal: Pt. Will Perform Lower Body Dressing Flowsheets (Taken 07/20/2021 1009) Pt Will Perform Lower Body Dressing:  with mod assist  sitting/lateral leans Goal: Pt. Will Transfer To Toilet Flowsheets (Taken 07/20/2021 1009) Pt Will Transfer to Toilet:  with min assist  with mod assist  stand pivot transfer  bedside commode Goal: Pt/Caregiver Will Perform Home Exercise Program Flowsheets (Taken 07/20/2021 1009) Pt/caregiver will Perform Home Exercise Program:  Increased ROM  Both right and left upper extremity  With minimal assist  Amberlee Garvey OT, MOT

## 2021-07-20 NOTE — Progress Notes (Signed)
Called Taylor Andrews pt's niece and POA informed patient will be going to [place] once EMS arrives.

## 2021-07-24 DIAGNOSIS — I82402 Acute embolism and thrombosis of unspecified deep veins of left lower extremity: Secondary | ICD-10-CM | POA: Diagnosis not present

## 2021-07-24 DIAGNOSIS — Z794 Long term (current) use of insulin: Secondary | ICD-10-CM | POA: Diagnosis not present

## 2021-07-24 DIAGNOSIS — J9601 Acute respiratory failure with hypoxia: Secondary | ICD-10-CM | POA: Diagnosis not present

## 2021-07-24 DIAGNOSIS — E119 Type 2 diabetes mellitus without complications: Secondary | ICD-10-CM | POA: Diagnosis not present

## 2021-07-24 DIAGNOSIS — I10 Essential (primary) hypertension: Secondary | ICD-10-CM | POA: Diagnosis not present

## 2021-07-25 DIAGNOSIS — L03319 Cellulitis of trunk, unspecified: Secondary | ICD-10-CM | POA: Diagnosis not present

## 2021-07-25 DIAGNOSIS — R5381 Other malaise: Secondary | ICD-10-CM | POA: Diagnosis not present

## 2021-07-26 DIAGNOSIS — E119 Type 2 diabetes mellitus without complications: Secondary | ICD-10-CM | POA: Diagnosis not present

## 2021-07-26 DIAGNOSIS — Z7901 Long term (current) use of anticoagulants: Secondary | ICD-10-CM | POA: Diagnosis not present

## 2021-07-26 DIAGNOSIS — I10 Essential (primary) hypertension: Secondary | ICD-10-CM | POA: Diagnosis not present

## 2021-07-26 DIAGNOSIS — R627 Adult failure to thrive: Secondary | ICD-10-CM | POA: Diagnosis not present

## 2021-07-26 DIAGNOSIS — L89154 Pressure ulcer of sacral region, stage 4: Secondary | ICD-10-CM | POA: Diagnosis not present

## 2021-07-27 DIAGNOSIS — I10 Essential (primary) hypertension: Secondary | ICD-10-CM | POA: Diagnosis not present

## 2021-07-31 DIAGNOSIS — I10 Essential (primary) hypertension: Secondary | ICD-10-CM | POA: Diagnosis not present

## 2021-07-31 DIAGNOSIS — J9601 Acute respiratory failure with hypoxia: Secondary | ICD-10-CM | POA: Diagnosis not present

## 2021-07-31 DIAGNOSIS — I82402 Acute embolism and thrombosis of unspecified deep veins of left lower extremity: Secondary | ICD-10-CM | POA: Diagnosis not present

## 2021-08-02 DIAGNOSIS — E119 Type 2 diabetes mellitus without complications: Secondary | ICD-10-CM | POA: Diagnosis not present

## 2021-08-02 DIAGNOSIS — L89154 Pressure ulcer of sacral region, stage 4: Secondary | ICD-10-CM | POA: Diagnosis not present

## 2021-08-02 DIAGNOSIS — R627 Adult failure to thrive: Secondary | ICD-10-CM | POA: Diagnosis not present

## 2021-08-07 DIAGNOSIS — Z79899 Other long term (current) drug therapy: Secondary | ICD-10-CM | POA: Diagnosis not present

## 2021-08-07 DIAGNOSIS — I10 Essential (primary) hypertension: Secondary | ICD-10-CM | POA: Diagnosis not present

## 2021-08-07 DIAGNOSIS — E119 Type 2 diabetes mellitus without complications: Secondary | ICD-10-CM | POA: Diagnosis not present

## 2021-08-07 DIAGNOSIS — R52 Pain, unspecified: Secondary | ICD-10-CM | POA: Diagnosis not present

## 2021-08-07 DIAGNOSIS — J9601 Acute respiratory failure with hypoxia: Secondary | ICD-10-CM | POA: Diagnosis not present

## 2021-08-09 DIAGNOSIS — L89154 Pressure ulcer of sacral region, stage 4: Secondary | ICD-10-CM | POA: Diagnosis not present

## 2021-08-09 DIAGNOSIS — E119 Type 2 diabetes mellitus without complications: Secondary | ICD-10-CM | POA: Diagnosis not present

## 2021-08-09 DIAGNOSIS — R627 Adult failure to thrive: Secondary | ICD-10-CM | POA: Diagnosis not present

## 2021-08-09 DIAGNOSIS — R053 Chronic cough: Secondary | ICD-10-CM | POA: Diagnosis not present

## 2021-08-10 DIAGNOSIS — E119 Type 2 diabetes mellitus without complications: Secondary | ICD-10-CM | POA: Diagnosis not present

## 2021-08-10 DIAGNOSIS — R52 Pain, unspecified: Secondary | ICD-10-CM | POA: Diagnosis not present

## 2021-08-10 DIAGNOSIS — I10 Essential (primary) hypertension: Secondary | ICD-10-CM | POA: Diagnosis not present

## 2021-08-10 DIAGNOSIS — R5381 Other malaise: Secondary | ICD-10-CM | POA: Diagnosis not present

## 2021-08-10 DIAGNOSIS — Z79899 Other long term (current) drug therapy: Secondary | ICD-10-CM | POA: Diagnosis not present

## 2021-09-05 DEATH — deceased
